# Patient Record
Sex: Female | Born: 1980 | Race: White | Hispanic: No | Marital: Single | Smoking: Never smoker
Health system: Southern US, Community
[De-identification: ages and names within clinical notes are randomized; demographics above are authoritative.]

## PROBLEM LIST (undated history)

## (undated) DIAGNOSIS — M5126 Other intervertebral disc displacement, lumbar region: Secondary | ICD-10-CM

## (undated) DIAGNOSIS — T7840XA Allergy, unspecified, initial encounter: Secondary | ICD-10-CM

## (undated) DIAGNOSIS — F79 Unspecified intellectual disabilities: Secondary | ICD-10-CM

## (undated) DIAGNOSIS — K219 Gastro-esophageal reflux disease without esophagitis: Secondary | ICD-10-CM

## (undated) DIAGNOSIS — M1990 Unspecified osteoarthritis, unspecified site: Secondary | ICD-10-CM

## (undated) DIAGNOSIS — R0602 Shortness of breath: Secondary | ICD-10-CM

## (undated) HISTORY — DX: Gastro-esophageal reflux disease without esophagitis: K21.9

## (undated) HISTORY — DX: Allergy, unspecified, initial encounter: T78.40XA

## (undated) HISTORY — DX: Unspecified osteoarthritis, unspecified site: M19.90

## (undated) HISTORY — DX: Other intervertebral disc displacement, lumbar region: M51.26

---

## 2013-01-19 ENCOUNTER — Telehealth: Payer: Self-pay | Admitting: Nurse Practitioner

## 2013-01-19 NOTE — Telephone Encounter (Signed)
APPTMADE FOR Thursday WITH MMM

## 2013-01-21 ENCOUNTER — Ambulatory Visit (INDEPENDENT_AMBULATORY_CARE_PROVIDER_SITE_OTHER): Payer: Medicare Other | Admitting: Nurse Practitioner

## 2013-01-21 ENCOUNTER — Encounter: Payer: Self-pay | Admitting: Nurse Practitioner

## 2013-01-21 VITALS — BP 109/72 | HR 97 | Temp 97.8°F | Ht 63.0 in | Wt 226.0 lb

## 2013-01-21 DIAGNOSIS — F79 Unspecified intellectual disabilities: Secondary | ICD-10-CM | POA: Diagnosis not present

## 2013-01-21 DIAGNOSIS — M545 Low back pain, unspecified: Secondary | ICD-10-CM

## 2013-01-21 DIAGNOSIS — R635 Abnormal weight gain: Secondary | ICD-10-CM | POA: Diagnosis not present

## 2013-01-21 DIAGNOSIS — E785 Hyperlipidemia, unspecified: Secondary | ICD-10-CM | POA: Diagnosis not present

## 2013-01-21 LAB — THYROID PANEL WITH TSH
Free Thyroxine Index: 3.3 (ref 1.0–3.9)
T3 Uptake: 36.3 % (ref 22.5–37.0)
T4, Total: 9.2 ug/dL (ref 5.0–12.5)
TSH: 1.073 u[IU]/mL (ref 0.350–4.500)

## 2013-01-21 LAB — COMPLETE METABOLIC PANEL WITH GFR
ALT: 28 U/L (ref 0–35)
AST: 15 U/L (ref 0–37)
Albumin: 4.5 g/dL (ref 3.5–5.2)
Alkaline Phosphatase: 50 U/L (ref 39–117)
BUN: 10 mg/dL (ref 6–23)
CO2: 22 mEq/L (ref 19–32)
Calcium: 9.8 mg/dL (ref 8.4–10.5)
Chloride: 104 mEq/L (ref 96–112)
Creat: 0.76 mg/dL (ref 0.50–1.10)
GFR, Est African American: >89 mL/min
GFR, Est Non African American: >89 mL/min
Glucose, Bld: 93 mg/dL (ref 70–99)
Potassium: 4 mEq/L (ref 3.5–5.3)
Sodium: 138 mEq/L (ref 135–145)
Total Bilirubin: 0.3 mg/dL (ref 0.3–1.2)
Total Protein: 7.3 g/dL (ref 6.0–8.3)

## 2013-01-21 MED ORDER — PREDNISONE (PAK) 10 MG PO TABS: 10.0000 mg | ORAL_TABLET | Freq: Every day | ORAL | Status: DC

## 2013-01-21 MED ORDER — CYCLOBENZAPRINE HCL 5 MG PO TABS: 5.0000 mg | ORAL_TABLET | Freq: Three times a day (TID) | ORAL | Status: DC | PRN

## 2013-01-21 NOTE — Progress Notes (Signed)
  Subjective:    Patient ID: Ashlee Hoffman, female    DOB: [birth year], 32 y.o.   MRN: [id number]  HPI- Patient brought amy stating that she has been c/o lower back pian. Started in the last couple of weeks she is constantly complaining. Have given her aleve which is not easing the pain. Seems to be worse when she is up moving around. Mom says that she has been gaining weight- hungry all the time and not sure if that is what is causing back pain. Mom has been giving her healthy foods to eat but still gaining weight.    Review of Systems  Musculoskeletal: Positive for back pain (hard to evaluate severity d/t patient handicap).       Objective:   Physical Exam  Constitutional: She appears well-developed and well-nourished.  Cardiovascular: Normal rate, normal heart sounds and intact distal pulses.   Pulmonary/Chest: Effort normal and breath sounds normal.  Musculoskeletal:  Point tenrderness mid lower back on palpation FROM of back with slight pain on rotation. (-) SLR bil Motor strength and sensation distally intact bil  Neurological: She has normal reflexes.    BP 109/72  Pulse 97  Temp(Src) 97.8 F (36.6 C) (Oral)  Ht 5\' 3"  (1.6 m)  Wt 226 lb (102.513 kg)  BMI 40.04 kg/m2       Assessment & Plan:  1. Mentally disabled   2. Low back pain Moist heat Rest No heavy lifting - cyclobenzaprine (FLEXERIL) 5 MG tablet; Take 1 tablet (5 mg total) by mouth 3 (three) times daily as needed for muscle spasms.  Dispense: 30 tablet; Refill: 1 - predniSONE (STERAPRED UNI-PAK) 10 MG tablet; Take 1 tablet (10 mg total) by mouth daily. As directed X6 days  Dispense: 21 tablet; Refill: 0  3. Weight gain Continue low fat diet and exercise - COMPLETE METABOLIC PANEL WITH GFR - NMR Lipoprofile with Lipids  Mary-Margaret Daphine Deutscher, FNP  - Thyroid Panel With TSH

## 2013-01-21 NOTE — Patient Instructions (Signed)
Back Pain, Adult Low back pain is very common. About 1 in 5 people have back pain.The cause of low back pain is rarely dangerous. The pain often gets better over time.About half of people with a sudden onset of back pain feel better in just 2 weeks. About 8 in 10 people feel better by 6 weeks.  CAUSES Some common causes of back pain include:  Strain of the muscles or ligaments supporting the spine.  Wear and tear (degeneration) of the spinal discs.  Arthritis.  Direct injury to the back. DIAGNOSIS Most of the time, the direct cause of low back pain is not known.However, back pain can be treated effectively even when the exact cause of the pain is unknown.Answering your caregiver's questions about your overall health and symptoms is one of the most accurate ways to make sure the cause of your pain is not dangerous. If your caregiver needs more information, he or she may order lab work or imaging tests (X-rays or MRIs).However, even if imaging tests show changes in your back, this usually does not require surgery. HOME CARE INSTRUCTIONS For many people, back pain returns.Since low back pain is rarely dangerous, it is often a condition that people can learn to manageon their own.   Remain active. It is stressful on the back to sit or stand in one place. Do not sit, drive, or stand in one place for more than 30 minutes at a time. Take short walks on level surfaces as soon as pain allows.Try to increase the length of time you walk each day.  Do not stay in bed.Resting more than 1 or 2 days can delay your recovery.  Do not avoid exercise or work.Your body is made to move.It is not dangerous to be active, even though your back may hurt.Your back will likely heal faster if you return to being active before your pain is gone.  Pay attention to your body when you bend and lift. Many people have less discomfortwhen lifting if they bend their knees, keep the load close to their bodies,and  avoid twisting. Often, the most comfortable positions are those that put less stress on your recovering back.  Find a comfortable position to sleep. Use a firm mattress and lie on your side with your knees slightly bent. If you lie on your back, put a pillow under your knees.  Only take over-the-counter or prescription medicines as directed by your caregiver. Over-the-counter medicines to reduce pain and inflammation are often the most helpful.Your caregiver may prescribe muscle relaxant drugs.These medicines help dull your pain so you can more quickly return to your normal activities and healthy exercise.  Put ice on the injured area.  Put ice in a plastic bag.  Place a towel between your skin and the bag.  Leave the ice on for 15 to 20 minutes, 3 to 4 times a day for the first 2 to 3 days. After that, ice and heat may be alternated to reduce pain and spasms.  Ask your caregiver about trying back exercises and gentle massage. This may be of some benefit.  Avoid feeling anxious or stressed.Stress increases muscle tension and can worsen back pain.It is important to recognize when you are anxious or stressed and learn ways to manage it.Exercise is a great option. SEEK MEDICAL CARE IF:  You have pain that is not relieved with rest or medicine.  You have pain that does not improve in 1 week.  You have new symptoms.  You are generally   not feeling well. SEEK IMMEDIATE MEDICAL CARE IF:   You have pain that radiates from your back into your legs.  You develop new bowel or bladder control problems.  You have unusual weakness or numbness in your arms or legs.  You develop nausea or vomiting.  You develop abdominal pain.  You feel faint. Document Released: 09/02/2005 Document Revised: 03/03/2012 Document Reviewed: 01/21/2011 Exit[employer] Patient Information 2013 [employer].  

## 2013-01-25 LAB — NMR LIPOPROFILE WITH LIPIDS
Cholesterol, Total: 155 mg/dL (ref <200)
HDL Particle Number: 25.7 umol/L — ABNORMAL LOW (ref >=30.5)
HDL Size: 8.7 nm — ABNORMAL LOW (ref >=9.2)
HDL-C: 35 mg/dL — ABNORMAL LOW (ref >=40)
LDL (calc): 97 mg/dL (ref <100)
LDL Particle Number: 1226 nmol/L — ABNORMAL HIGH (ref <1000)
LDL Size: 21 nm (ref >20.5)
LP-IR Score: 65 — ABNORMAL HIGH (ref <=45)
Large HDL-P: 4.3 umol/L — ABNORMAL LOW (ref >=4.8)
Large VLDL-P: 4 nmol/L — ABNORMAL HIGH (ref <=2.7)
Small LDL Particle Number: 648 nmol/L — ABNORMAL HIGH (ref <=527)
Triglycerides: 117 mg/dL (ref <150)
VLDL Size: 46.4 nm (ref <=46.6)

## 2013-02-22 ENCOUNTER — Ambulatory Visit (INDEPENDENT_AMBULATORY_CARE_PROVIDER_SITE_OTHER): Payer: Medicare Other | Admitting: Nurse Practitioner

## 2013-02-22 ENCOUNTER — Encounter: Payer: Self-pay | Admitting: Nurse Practitioner

## 2013-02-22 VITALS — BP 109/74 | Temp 98.4°F | Ht 63.0 in | Wt 222.0 lb

## 2013-02-22 DIAGNOSIS — M545 Low back pain, unspecified: Secondary | ICD-10-CM

## 2013-02-22 MED ORDER — MELOXICAM 15 MG PO TABS: 15.0000 mg | ORAL_TABLET | Freq: Every day | ORAL | Status: DC

## 2013-02-22 MED ORDER — CELECOXIB 200 MG PO CAPS: 200.0000 mg | ORAL_CAPSULE | Freq: Two times a day (BID) | ORAL | Status: DC

## 2013-02-22 NOTE — Progress Notes (Signed)
  Subjective:    Patient ID: Ashlee Hoffman, female    DOB: 1981/02/26, 32 y.o.   MRN: [id number]  HPI Patient in for follow-up of low back pain- WAs given flexeril and steroid pack-She is still complaining everyday of back hurting- Pain stays in the same spot and doesn't radiate .    Review of Systems  Musculoskeletal: Positive for back pain. Negative for gait problem.       Objective:   Physical Exam  Constitutional: She appears well-developed and well-nourished.  Cardiovascular: Normal rate, normal heart sounds and intact distal pulses.   Pulmonary/Chest: Effort normal and breath sounds normal.  Musculoskeletal:  Decreae ROM of lumbar spine due to pain on flexion an dextension. (-) SLR bil Motor strength and sensation distally intact  Neurological: She has normal reflexes.     BP 109/74  Temp(Src) 98.4 F (36.9 C) (Oral)  Ht 5\' 3"  (1.6 m)  Wt 222 lb (100.699 kg)  BMI 39.34 kg/m2      Assessment & Plan:  1. Low back pain Moist heat and rest - MR Lumbar Spine Wo Contrast; Future - meloxicam (MOBIC) 15 MG tablet; Take 1 tablet (15 mg total) by mouth daily.  Dispense: 30 tablet; Refill: 0   Mary-Margaret Daphine Deutscher, FNP

## 2013-02-22 NOTE — Patient Instructions (Signed)
Back Pain, Adult  Low back pain is very common. About 1 in 5 people have back pain. The cause of low back pain is rarely dangerous. The pain often gets better over time. About half of people with a sudden onset of back pain feel better in just 2 weeks. About 8 in 10 people feel better by 6 weeks.   CAUSES  Some common causes of back pain include:  · Strain of the muscles or ligaments supporting the spine.  · Wear and tear (degeneration) of the spinal discs.  · Arthritis.  · Direct injury to the back.  DIAGNOSIS  Most of the time, the direct cause of low back pain is not known. However, back pain can be treated effectively even when the exact cause of the pain is unknown. Answering your caregiver's questions about your overall health and symptoms is one of the most accurate ways to make sure the cause of your pain is not dangerous. If your caregiver needs more information, he or she may order lab work or imaging tests (X-rays or MRIs). However, even if imaging tests show changes in your back, this usually does not require surgery.  HOME CARE INSTRUCTIONS  For many people, back pain returns. Since low back pain is rarely dangerous, it is often a condition that people can learn to manage on their own.   · Remain active. It is stressful on the back to sit or stand in one place. Do not sit, drive, or stand in one place for more than 30 minutes at a time. Take short walks on level surfaces as soon as pain allows. Try to increase the length of time you walk each day.  · Do not stay in bed. Resting more than 1 or 2 days can delay your recovery.  · Do not avoid exercise or work. Your body is made to move. It is not dangerous to be active, even though your back may hurt. Your back will likely heal faster if you return to being active before your pain is gone.  · Pay attention to your body when you  bend and lift. Many people have less discomfort when lifting if they bend their knees, keep the load close to their bodies, and  avoid twisting. Often, the most comfortable positions are those that put less stress on your recovering back.  · Find a comfortable position to sleep. Use a firm mattress and lie on your side with your knees slightly bent. If you lie on your back, put a pillow under your knees.  · Only take over-the-counter or prescription medicines as directed by your caregiver. Over-the-counter medicines to reduce pain and inflammation are often the most helpful. Your caregiver may prescribe muscle relaxant drugs. These medicines help dull your pain so you can more quickly return to your normal activities and healthy exercise.  · Put ice on the injured area.  · Put ice in a plastic bag.  · Place a towel between your skin and the bag.  · Leave the ice on for 15-20 minutes, 3-4 times a day for the first 2 to 3 days. After that, ice and heat may be alternated to reduce pain and spasms.  · Ask your caregiver about trying back exercises and gentle massage. This may be of some benefit.  · Avoid feeling anxious or stressed. Stress increases muscle tension and can worsen back pain. It is important to recognize when you are anxious or stressed and learn ways to manage it. Exercise is a great option.  SEEK MEDICAL CARE IF:  · You have pain that is not relieved with rest or   medicine.  · You have pain that does not improve in 1 week.  · You have new symptoms.  · You are generally not feeling well.  SEEK IMMEDIATE MEDICAL CARE IF:   · You have pain that radiates from your back into your legs.  · You develop new bowel or bladder control problems.  · You have unusual weakness or numbness in your arms or legs.  · You develop nausea or vomiting.  · You develop abdominal pain.  · You feel faint.  Document Released: 09/02/2005 Document Revised: 03/03/2012 Document Reviewed: 01/21/2011  ExitCare® Patient Information ©2014 [employer].

## 2013-02-26 ENCOUNTER — Other Ambulatory Visit (HOSPITAL_COMMUNITY): Payer: Self-pay

## 2013-03-01 ENCOUNTER — Ambulatory Visit (HOSPITAL_COMMUNITY)
Admission: RE | Admit: 2013-03-01 | Discharge: 2013-03-01 | Disposition: A | Discharge status: Home / Self Care | Payer: Medicare Other | Source: Ambulatory Visit | Attending: Nurse Practitioner | Admitting: Nurse Practitioner

## 2013-03-01 DIAGNOSIS — M545 Low back pain, unspecified: Secondary | ICD-10-CM | POA: Diagnosis not present

## 2013-03-01 DIAGNOSIS — M79609 Pain in unspecified limb: Secondary | ICD-10-CM | POA: Diagnosis not present

## 2013-03-01 DIAGNOSIS — M5380 Other specified dorsopathies, site unspecified: Secondary | ICD-10-CM | POA: Insufficient documentation

## 2013-03-01 DIAGNOSIS — M5124 Other intervertebral disc displacement, thoracic region: Secondary | ICD-10-CM | POA: Insufficient documentation

## 2013-03-01 DIAGNOSIS — M5126 Other intervertebral disc displacement, lumbar region: Secondary | ICD-10-CM | POA: Diagnosis not present

## 2013-03-01 DIAGNOSIS — M47817 Spondylosis without myelopathy or radiculopathy, lumbosacral region: Secondary | ICD-10-CM | POA: Diagnosis not present

## 2013-03-02 ENCOUNTER — Other Ambulatory Visit: Payer: Self-pay | Admitting: Nurse Practitioner

## 2013-03-02 DIAGNOSIS — M5126 Other intervertebral disc displacement, lumbar region: Secondary | ICD-10-CM

## 2013-03-16 DIAGNOSIS — M5126 Other intervertebral disc displacement, lumbar region: Secondary | ICD-10-CM | POA: Diagnosis not present

## 2013-05-04 DIAGNOSIS — M5126 Other intervertebral disc displacement, lumbar region: Secondary | ICD-10-CM | POA: Diagnosis not present

## 2013-05-05 ENCOUNTER — Other Ambulatory Visit: Payer: Self-pay | Admitting: Neurosurgery

## 2013-05-14 ENCOUNTER — Encounter (HOSPITAL_COMMUNITY): Payer: Self-pay | Admitting: Pharmacy Technician

## 2013-05-14 NOTE — Pre-Procedure Instructions (Signed)
Aalliyah Kilker  05/14/2013   Your procedure is scheduled on:  Monday, September 8th.  Report to [facility] at  AM.  Call this number if you have problems the morning of surgery: [phone]   Remember:   Do not eat food or drink liquids after midnight.   Take these medicines the morning of surgery with A SIP OF WATER:    Do not wear jewelry, make-up or nail polish.  Do not wear lotions, powders, or perfumes. You may wear deodorant.  Do not shave 48 hours prior to surgery.   Do not bring valuables to the hospital.  [facility] is not responsible for any belongings or valuables.  Contacts, dentures or bridgework may not be worn into surgery.  Leave suitcase in the car. After surgery it may be brought to your room.  For patients admitted to the hospital, checkout time is 11:00 AM the day of discharge.   Patients discharged the day of surgery will not be allowed to drive home.  Name and phone number of your driver:   Special Instructions: Shower using CHG 2 nights before surgery and the night before surgery.  If you shower the day of surgery use CHG.  Use special wash - you have one bottle of CHG for all showers.  You should use approximately 1/3 of the bottle for each shower.   Please read over the following fact sheets that you were given: Pain Booklet, Coughing and Deep Breathing and Surgical Site Infection Prevention

## 2013-05-17 DIAGNOSIS — M5136 Other intervertebral disc degeneration, lumbar region: Secondary | ICD-10-CM

## 2013-05-17 DIAGNOSIS — M5126 Other intervertebral disc displacement, lumbar region: Secondary | ICD-10-CM

## 2013-05-17 DIAGNOSIS — M51369 Other intervertebral disc degeneration, lumbar region without mention of lumbar back pain or lower extremity pain: Secondary | ICD-10-CM

## 2013-05-17 HISTORY — DX: Other intervertebral disc degeneration, lumbar region: M51.36

## 2013-05-17 HISTORY — DX: Other intervertebral disc displacement, lumbar region: M51.26

## 2013-05-17 HISTORY — DX: Other intervertebral disc degeneration, lumbar region without mention of lumbar back pain or lower extremity pain: M51.369

## 2013-05-18 ENCOUNTER — Encounter (HOSPITAL_COMMUNITY)
Admission: RE | Admit: 2013-05-18 | Discharge: 2013-05-18 | Disposition: A | Discharge status: Home / Self Care | Payer: Medicare Other | Source: Ambulatory Visit | Attending: Neurosurgery | Admitting: Neurosurgery

## 2013-05-18 ENCOUNTER — Encounter (HOSPITAL_COMMUNITY): Payer: Self-pay

## 2013-05-18 DIAGNOSIS — Z01812 Encounter for preprocedural laboratory examination: Secondary | ICD-10-CM | POA: Insufficient documentation

## 2013-05-18 DIAGNOSIS — Z01818 Encounter for other preprocedural examination: Secondary | ICD-10-CM | POA: Insufficient documentation

## 2013-05-18 HISTORY — DX: Shortness of breath: R06.02

## 2013-05-18 LAB — CBC WITH DIFFERENTIAL/PLATELET
Basophils Absolute: 0 10*3/uL (ref 0.0–0.1)
Basophils Relative: 0 % (ref 0–1)
Eosinophils Absolute: 0.1 10*3/uL (ref 0.0–0.7)
Eosinophils Relative: 1 % (ref 0–5)
HCT: 36 % (ref 36.0–46.0)
Hemoglobin: 12.5 g/dL (ref 12.0–15.0)
Lymphocytes Relative: 23 % (ref 12–46)
Lymphs Abs: 2.2 10*3/uL (ref 0.7–4.0)
MCH: 30 pg (ref 26.0–34.0)
MCHC: 34.7 g/dL (ref 30.0–36.0)
MCV: 86.5 fL (ref 78.0–100.0)
Monocytes Absolute: 0.6 10*3/uL (ref 0.1–1.0)
Monocytes Relative: 7 % (ref 3–12)
Neutro Abs: 6.4 10*3/uL (ref 1.7–7.7)
Neutrophils Relative %: 69 % (ref 43–77)
Platelets: 267 10*3/uL (ref 150–400)
RBC: 4.16 MIL/uL (ref 3.87–5.11)
RDW: 12.3 % (ref 11.5–15.5)
WBC: 9.3 10*3/uL (ref 4.0–10.5)

## 2013-05-18 LAB — BASIC METABOLIC PANEL
BUN: 15 mg/dL (ref 6–23)
CO2: 21 mEq/L (ref 19–32)
Calcium: 9.2 mg/dL (ref 8.4–10.5)
Chloride: 104 mEq/L (ref 96–112)
Creatinine, Ser: 0.65 mg/dL (ref 0.50–1.10)
GFR calc Af Amer: >90 mL/min (ref >90)
GFR calc non Af Amer: >90 mL/min (ref >90)
Glucose, Bld: 84 mg/dL (ref 70–99)
Potassium: 4.2 mEq/L (ref 3.5–5.1)
Sodium: 136 mEq/L (ref 135–145)

## 2013-05-18 LAB — SURGICAL PCR SCREEN
MRSA, PCR: NEGATIVE
Staphylococcus aureus: NEGATIVE

## 2013-05-18 LAB — HCG, SERUM, QUALITATIVE: Preg, Serum: NEGATIVE

## 2013-05-18 NOTE — Progress Notes (Signed)
Instructed to stop Mobic after today.

## 2013-05-18 NOTE — Pre-Procedure Instructions (Signed)
Ezmae Speers  05/18/2013   Your procedure is scheduled on:  Monday, September 8th.  Report to [facility] at  AM.  Call this number if you have problems the morning of surgery: [phone]   Remember:   Do not eat food or drink liquids after midnight.   Take these medicines the morning of surgery with A SIP OF WATER: medroxyPROGESTERone (PROVERA).     Do not wear jewelry, make-up or nail polish.  Do not wear lotions, powders, or perfumes. You may wear deodorant.  Do not shave 48 hours prior to surgery.   Do not bring valuables to the hospital.  [facility] is not responsible for any belongings or valuables.  Contacts, dentures or bridgework may not be worn into surgery.  Leave suitcase in the car. After surgery it may be brought to your room.  For patients admitted to the hospital, checkout time is 11:00 AM the day of discharge.   Patients discharged the day of surgery will not be allowed to drive home.  Name and phone number of your driver:   Special Instructions: Shower using CHG 2 nights before surgery and the night before surgery.  If you shower the day of surgery use CHG.  Use special wash - you have one bottle of CHG for all showers.  You should use approximately 1/3 of the bottle for each shower.   Please read over the following fact sheets that you were given: Pain Booklet, Coughing and Deep Breathing and Surgical Site Infection Prevention

## 2013-05-19 NOTE — Progress Notes (Signed)
No fax received from Primus Bravo (FNP) office regarding last visit/EKG. Office called, this nurse was told that office would send info.

## 2013-05-23 MED ORDER — CEFAZOLIN SODIUM-DEXTROSE 2-3 GM-% IV SOLR
2.0000 g | INTRAVENOUS | Status: AC
  Administered 2013-05-24: 2 g via INTRAVENOUS
  Filled 2013-05-23: qty 50

## 2013-05-24 ENCOUNTER — Ambulatory Visit (HOSPITAL_COMMUNITY): Payer: Medicare Other

## 2013-05-24 ENCOUNTER — Encounter (HOSPITAL_COMMUNITY): Payer: Self-pay | Admitting: Anesthesiology

## 2013-05-24 ENCOUNTER — Encounter (HOSPITAL_COMMUNITY)
Admission: RE | Disposition: A | Discharge status: Home / Self Care | Payer: Self-pay | Source: Ambulatory Visit | Attending: Neurosurgery

## 2013-05-24 ENCOUNTER — Ambulatory Visit (HOSPITAL_COMMUNITY)
Admission: RE | Admit: 2013-05-24 | Discharge: 2013-05-24 | Disposition: A | Discharge status: Home / Self Care | Payer: Medicare Other | Source: Ambulatory Visit | Attending: Neurosurgery | Admitting: Neurosurgery

## 2013-05-24 ENCOUNTER — Ambulatory Visit (HOSPITAL_COMMUNITY): Payer: Medicare Other | Admitting: Anesthesiology

## 2013-05-24 DIAGNOSIS — F79 Unspecified intellectual disabilities: Secondary | ICD-10-CM | POA: Insufficient documentation

## 2013-05-24 DIAGNOSIS — IMO0002 Reserved for concepts with insufficient information to code with codable children: Secondary | ICD-10-CM | POA: Diagnosis not present

## 2013-05-24 DIAGNOSIS — R079 Chest pain, unspecified: Secondary | ICD-10-CM | POA: Diagnosis not present

## 2013-05-24 DIAGNOSIS — M519 Unspecified thoracic, thoracolumbar and lumbosacral intervertebral disc disorder: Secondary | ICD-10-CM | POA: Diagnosis not present

## 2013-05-24 DIAGNOSIS — Z79899 Other long term (current) drug therapy: Secondary | ICD-10-CM | POA: Insufficient documentation

## 2013-05-24 DIAGNOSIS — M5126 Other intervertebral disc displacement, lumbar region: Secondary | ICD-10-CM | POA: Diagnosis not present

## 2013-05-24 DIAGNOSIS — Z452 Encounter for adjustment and management of vascular access device: Secondary | ICD-10-CM | POA: Diagnosis not present

## 2013-05-24 HISTORY — DX: Unspecified intellectual disabilities: F79

## 2013-05-24 HISTORY — PX: LUMBAR LAMINECTOMY/DECOMPRESSION MICRODISCECTOMY: SHX5026

## 2013-05-24 SURGERY — LUMBAR LAMINECTOMY/DECOMPRESSION MICRODISCECTOMY 1 LEVEL
Anesthesia: General | Site: Spine Lumbar | Wound class: Clean

## 2013-05-24 MED ORDER — CEFAZOLIN SODIUM 1-5 GM-% IV SOLN
1.0000 g | Freq: Three times a day (TID) | INTRAVENOUS | Status: DC
  Filled 2013-05-24 (×2): qty 50

## 2013-05-24 MED ORDER — BUPIVACAINE HCL (PF) 0.25 % IJ SOLN
INTRAMUSCULAR | Status: DC | PRN
  Administered 2013-05-24: 20 mL

## 2013-05-24 MED ORDER — CYCLOBENZAPRINE HCL 10 MG PO TABS
10.0000 mg | ORAL_TABLET | Freq: Three times a day (TID) | ORAL | Status: DC | PRN
  Administered 2013-05-24: 10 mg via ORAL
  Filled 2013-05-24: qty 1

## 2013-05-24 MED ORDER — PHENYLEPHRINE HCL 10 MG/ML IJ SOLN
INTRAMUSCULAR | Status: DC | PRN
  Administered 2013-05-24: 40 ug via INTRAVENOUS

## 2013-05-24 MED ORDER — HYDROCODONE-ACETAMINOPHEN 5-325 MG PO TABS: 1.0000 | ORAL_TABLET | ORAL | Status: DC | PRN

## 2013-05-24 MED ORDER — 0.9 % SODIUM CHLORIDE (POUR BTL) OPTIME
TOPICAL | Status: DC | PRN
  Administered 2013-05-24: 1000 mL

## 2013-05-24 MED ORDER — OXYCODONE-ACETAMINOPHEN 5-325 MG PO TABS
1.0000 | ORAL_TABLET | ORAL | Status: DC | PRN
  Administered 2013-05-24: 2 via ORAL
  Filled 2013-05-24: qty 2

## 2013-05-24 MED ORDER — ACETAMINOPHEN 325 MG PO TABS: 650.0000 mg | ORAL_TABLET | ORAL | Status: DC | PRN

## 2013-05-24 MED ORDER — ACETAMINOPHEN 650 MG RE SUPP: 650.0000 mg | RECTAL | Status: DC | PRN

## 2013-05-24 MED ORDER — HYDROMORPHONE HCL PF 1 MG/ML IJ SOLN
0.5000 mg | INTRAMUSCULAR | Status: DC | PRN
Start: 2013-05-24 — End: 2013-05-25

## 2013-05-24 MED ORDER — PROPOFOL 10 MG/ML IV BOLUS
INTRAVENOUS | Status: DC | PRN
  Administered 2013-05-24: 170 mg via INTRAVENOUS

## 2013-05-24 MED ORDER — GLYCOPYRROLATE 0.2 MG/ML IJ SOLN
INTRAMUSCULAR | Status: DC | PRN
  Administered 2013-05-24: .6 mg via INTRAVENOUS

## 2013-05-24 MED ORDER — SODIUM CHLORIDE 0.9 % IJ SOLN: 3.0000 mL | Freq: Two times a day (BID) | INTRAMUSCULAR | Status: DC

## 2013-05-24 MED ORDER — ONDANSETRON HCL 4 MG/2ML IJ SOLN: 4.0000 mg | INTRAMUSCULAR | Status: DC | PRN

## 2013-05-24 MED ORDER — NEOSTIGMINE METHYLSULFATE 1 MG/ML IJ SOLN
INTRAMUSCULAR | Status: DC | PRN
  Administered 2013-05-24: 4 mg via INTRAVENOUS

## 2013-05-24 MED ORDER — ONDANSETRON HCL 4 MG/2ML IJ SOLN
INTRAMUSCULAR | Status: DC | PRN
  Administered 2013-05-24: 4 mg via INTRAVENOUS

## 2013-05-24 MED ORDER — THROMBIN 5000 UNITS EX SOLR
CUTANEOUS | Status: DC | PRN
  Administered 2013-05-24 (×2): 5000 [IU] via TOPICAL

## 2013-05-24 MED ORDER — LIDOCAINE HCL (CARDIAC) 20 MG/ML IV SOLN
INTRAVENOUS | Status: DC | PRN
  Administered 2013-05-24: 40 mg via INTRAVENOUS

## 2013-05-24 MED ORDER — DEXAMETHASONE SODIUM PHOSPHATE 10 MG/ML IJ SOLN
10.0000 mg | INTRAMUSCULAR | Status: AC
  Administered 2013-05-24: 10 mg via INTRAVENOUS
  Filled 2013-05-24: qty 1

## 2013-05-24 MED ORDER — MIDAZOLAM HCL 5 MG/5ML IJ SOLN
INTRAMUSCULAR | Status: DC | PRN
  Administered 2013-05-24 (×2): 1 mg via INTRAVENOUS

## 2013-05-24 MED ORDER — KETOROLAC TROMETHAMINE 30 MG/ML IJ SOLN: 30.0000 mg | Freq: Four times a day (QID) | INTRAMUSCULAR | Status: DC

## 2013-05-24 MED ORDER — SODIUM CHLORIDE 0.9 % IJ SOLN: 3.0000 mL | INTRAMUSCULAR | Status: DC | PRN

## 2013-05-24 MED ORDER — FENTANYL CITRATE 0.05 MG/ML IJ SOLN
INTRAMUSCULAR | Status: DC | PRN
  Administered 2013-05-24 (×7): 50 ug via INTRAVENOUS

## 2013-05-24 MED ORDER — SODIUM CHLORIDE 0.9 % IR SOLN
Status: DC | PRN
  Administered 2013-05-24: 12:00:00

## 2013-05-24 MED ORDER — ROCURONIUM BROMIDE 100 MG/10ML IV SOLN
INTRAVENOUS | Status: DC | PRN
  Administered 2013-05-24: 45 mg via INTRAVENOUS
  Administered 2013-05-24: 5 mg via INTRAVENOUS

## 2013-05-24 MED ORDER — KETOROLAC TROMETHAMINE 30 MG/ML IJ SOLN
INTRAMUSCULAR | Status: DC | PRN
  Administered 2013-05-24: 30 mg via INTRAVENOUS

## 2013-05-24 MED ORDER — SENNA 8.6 MG PO TABS: 1.0000 | ORAL_TABLET | Freq: Two times a day (BID) | ORAL | Status: DC

## 2013-05-24 MED ORDER — MENTHOL 3 MG MT LOZG: 1.0000 | LOZENGE | OROMUCOSAL | Status: DC | PRN

## 2013-05-24 MED ORDER — ARTIFICIAL TEARS OP OINT
TOPICAL_OINTMENT | OPHTHALMIC | Status: DC | PRN
  Administered 2013-05-24: 1 via OPHTHALMIC

## 2013-05-24 MED ORDER — PHENOL 1.4 % MT LIQD: 1.0000 | OROMUCOSAL | Status: DC | PRN

## 2013-05-24 MED ORDER — ALUM & MAG HYDROXIDE-SIMETH 200-200-20 MG/5ML PO SUSP: 30.0000 mL | Freq: Four times a day (QID) | ORAL | Status: DC | PRN

## 2013-05-24 MED ORDER — LACTATED RINGERS IV SOLN: INTRAVENOUS | Status: DC

## 2013-05-24 MED ORDER — HEMOSTATIC AGENTS (NO CHARGE) OPTIME
TOPICAL | Status: DC | PRN
  Administered 2013-05-24: 1 via TOPICAL

## 2013-05-24 MED ORDER — SODIUM CHLORIDE 0.9 % IV SOLN: 250.0000 mL | INTRAVENOUS | Status: DC

## 2013-05-24 MED ORDER — MEDROXYPROGESTERONE ACETATE 10 MG PO TABS: 10.0000 mg | ORAL_TABLET | Freq: Every day | ORAL | Status: DC

## 2013-05-24 MED ORDER — CYCLOBENZAPRINE HCL 10 MG PO TABS: 10.0000 mg | ORAL_TABLET | Freq: Three times a day (TID) | ORAL | Status: DC | PRN

## 2013-05-24 MED ORDER — LACTATED RINGERS IV SOLN
INTRAVENOUS | Status: DC | PRN
  Administered 2013-05-24: 11:00:00 via INTRAVENOUS

## 2013-05-24 SURGICAL SUPPLY — 57 items
BAG DECANTER FOR FLEXI CONT (MISCELLANEOUS) ×2 IMPLANT
BENZOIN TINCTURE PRP APPL 2/3 (GAUZE/BANDAGES/DRESSINGS) ×2 IMPLANT
BLADE SURG ROTATE 9660 (MISCELLANEOUS) IMPLANT
BRUSH SCRUB EZ PLAIN DRY (MISCELLANEOUS) ×2 IMPLANT
BUR CUTTER 7.0 ROUND (BURR) ×2 IMPLANT
CANISTER SUCTION 2500CC (MISCELLANEOUS) ×2 IMPLANT
CLOTH BEACON ORANGE TIMEOUT ST (SAFETY) ×2 IMPLANT
CONT SPEC 4OZ CLIKSEAL STRL BL (MISCELLANEOUS) ×2 IMPLANT
DECANTER SPIKE VIAL GLASS SM (MISCELLANEOUS) IMPLANT
DERMABOND ADHESIVE PROPEN (GAUZE/BANDAGES/DRESSINGS) ×1
DERMABOND ADVANCED (GAUZE/BANDAGES/DRESSINGS)
DERMABOND ADVANCED .7 DNX12 (GAUZE/BANDAGES/DRESSINGS) IMPLANT
DERMABOND ADVANCED .7 DNX6 (GAUZE/BANDAGES/DRESSINGS) ×1 IMPLANT
DRAPE LAPAROTOMY 100X72X124 (DRAPES) ×2 IMPLANT
DRAPE MICROSCOPE ZEISS OPMI (DRAPES) ×2 IMPLANT
DRAPE POUCH INSTRU U-SHP 10X18 (DRAPES) ×2 IMPLANT
DRAPE PROXIMA HALF (DRAPES) IMPLANT
DRAPE SURG 17X23 STRL (DRAPES) ×4 IMPLANT
DRSG OPSITE POSTOP 3X4 (GAUZE/BANDAGES/DRESSINGS) ×2 IMPLANT
DURAPREP 26ML APPLICATOR (WOUND CARE) ×2 IMPLANT
ELECT REM PT RETURN 9FT ADLT (ELECTROSURGICAL) ×2
ELECTRODE REM PT RTRN 9FT ADLT (ELECTROSURGICAL) ×1 IMPLANT
GAUZE SPONGE 4X4 16PLY XRAY LF (GAUZE/BANDAGES/DRESSINGS) IMPLANT
GLOVE BIOGEL PI IND STRL 7.0 (GLOVE) ×4 IMPLANT
GLOVE BIOGEL PI IND STRL 7.5 (GLOVE) ×1 IMPLANT
GLOVE BIOGEL PI IND STRL 8 (GLOVE) ×1 IMPLANT
GLOVE BIOGEL PI INDICATOR 7.0 (GLOVE) ×4
GLOVE BIOGEL PI INDICATOR 7.5 (GLOVE) ×1
GLOVE BIOGEL PI INDICATOR 8 (GLOVE) ×1
GLOVE ECLIPSE 7.5 STRL STRAW (GLOVE) ×4 IMPLANT
GLOVE ECLIPSE 8.5 STRL (GLOVE) ×2 IMPLANT
GLOVE EXAM NITRILE LRG STRL (GLOVE) IMPLANT
GLOVE EXAM NITRILE MD LF STRL (GLOVE) IMPLANT
GLOVE EXAM NITRILE XL STR (GLOVE) IMPLANT
GLOVE EXAM NITRILE XS STR PU (GLOVE) IMPLANT
GLOVE SS BIOGEL STRL SZ 6.5 (GLOVE) ×3 IMPLANT
GLOVE SUPERSENSE BIOGEL SZ 6.5 (GLOVE) ×3
GOWN BRE IMP SLV AUR LG STRL (GOWN DISPOSABLE) ×2 IMPLANT
GOWN BRE IMP SLV AUR XL STRL (GOWN DISPOSABLE) ×6 IMPLANT
GOWN STRL REIN 2XL LVL4 (GOWN DISPOSABLE) IMPLANT
KIT BASIN OR (CUSTOM PROCEDURE TRAY) ×2 IMPLANT
KIT ROOM TURNOVER OR (KITS) ×2 IMPLANT
NEEDLE HYPO 22GX1.5 SAFETY (NEEDLE) ×2 IMPLANT
NEEDLE SPNL 22GX3.5 QUINCKE BK (NEEDLE) ×2 IMPLANT
NS IRRIG 1000ML POUR BTL (IV SOLUTION) ×2 IMPLANT
PACK LAMINECTOMY NEURO (CUSTOM PROCEDURE TRAY) ×2 IMPLANT
PAD ARMBOARD 7.5X6 YLW CONV (MISCELLANEOUS) ×10 IMPLANT
RUBBERBAND STERILE (MISCELLANEOUS) ×4 IMPLANT
SPONGE GAUZE 4X4 12PLY (GAUZE/BANDAGES/DRESSINGS) IMPLANT
SPONGE SURGIFOAM ABS GEL SZ50 (HEMOSTASIS) ×2 IMPLANT
STRIP CLOSURE SKIN 1/2X4 (GAUZE/BANDAGES/DRESSINGS) ×2 IMPLANT
SUT VIC AB 2-0 CT1 18 (SUTURE) ×2 IMPLANT
SUT VIC AB 3-0 SH 8-18 (SUTURE) ×2 IMPLANT
SYR 20ML ECCENTRIC (SYRINGE) ×2 IMPLANT
TOWEL OR 17X24 6PK STRL BLUE (TOWEL DISPOSABLE) ×2 IMPLANT
TOWEL OR 17X26 10 PK STRL BLUE (TOWEL DISPOSABLE) ×2 IMPLANT
WATER STERILE IRR 1000ML POUR (IV SOLUTION) ×2 IMPLANT

## 2013-05-24 NOTE — Preoperative (Signed)
Beta Blockers   Reason not to administer Beta Blockers:Not Applicable 

## 2013-05-24 NOTE — Anesthesia Preprocedure Evaluation (Addendum)
Anesthesia Evaluation  Patient identified by MRN, date of birth, ID band Patient awake    Reviewed: Allergy & Precautions, H&P , NPO status   Airway Mallampati: II TM Distance: >3 FB Neck ROM: Full    Dental  (+) Teeth Intact and Dental Advisory Given   Pulmonary          Cardiovascular     Neuro/Psych Mental retardation    GI/Hepatic   Endo/Other    Renal/GU      Musculoskeletal   Abdominal   Peds  Hematology   Anesthesia Other Findings   Reproductive/Obstetrics                           Anesthesia Physical Anesthesia Plan  ASA: II  Anesthesia Plan: General   Post-op Pain Management:    Induction: Intravenous  Airway Management Planned: Oral ETT  Additional Equipment:   Intra-op Plan:   Post-operative Plan: Extubation in OR  Informed Consent: I have reviewed the patients History and Physical, chart, labs and discussed the procedure including the risks, benefits and alternatives for the proposed anesthesia with the patient or authorized representative who has indicated his/her understanding and acceptance.   Dental advisory given  Plan Discussed with: CRNA, Anesthesiologist and Surgeon  Anesthesia Plan Comments: (Late entry. K Hypes, CRNA)      Anesthesia Quick Evaluation

## 2013-05-24 NOTE — Progress Notes (Signed)
Pt d/c'd home. Discharge instructions/teaching done by previous nurse. Dressing on surgical site (neck) and central line d'c'd sites CDI. Pt medicated for pain just before leaving. Transported out in wheelchair to the car by the nurse tech, accompanied by her parents.

## 2013-05-24 NOTE — Brief Op Note (Signed)
05/24/2013  12:43 PM  PATIENT:  Ashlee Hoffman  32 y.o. female  PRE-OPERATIVE DIAGNOSIS:  HNP  POST-OPERATIVE DIAGNOSIS:  Herniated nucleous pulposus  PROCEDURE:  Procedure(s) with comments: LUMBAR LAMINECTOMY/DECOMPRESSION MICRODISCECTOMY 1 LEVEL LEFT LUMBAR FIVE-SACARAL-ONE (N/A) - left  SURGEON:  Surgeon(s) and Role:    * Temple Pacini, MD - Primary    * Hewitt Shorts, MD - Assisting  PHYSICIAN ASSISTANT:   ASSISTANTS:    ANESTHESIA:   general  EBL:  Total I/O In: 600 [I.V.:600] Out: -   BLOOD ADMINISTERED:none  DRAINS: none   LOCAL MEDICATIONS USED:  MARCAINE     SPECIMEN:  No Specimen  DISPOSITION OF SPECIMEN:  N/A  COUNTS:  YES  TOURNIQUET:  * No tourniquets in log *  DICTATION: .Dragon Dictation  PLAN OF CARE: Admit for overnight observation  PATIENT DISPOSITION:  PACU - hemodynamically stable.   Delay start of Pharmacological VTE agent (>24hrs) due to surgical blood loss or risk of bleeding: yes

## 2013-05-24 NOTE — Transfer of Care (Signed)
Immediate Anesthesia Transfer of Care Note  Patient: Ashlee Hoffman  Procedure(s) Performed: Procedure(s) with comments: LUMBAR LAMINECTOMY/DECOMPRESSION MICRODISCECTOMY 1 LEVEL LEFT LUMBAR FIVE-SACARAL-ONE (N/A) - left  Patient Location: PACU  Anesthesia Type:General  Level of Consciousness: awake and alert   Airway & Oxygen Therapy: Patient Spontanous Breathing and Patient connected to face mask oxygen  Post-op Assessment: Report given to PACU RN and Post -op Vital signs reviewed and stable  Post vital signs: Reviewed and stable  Complications: No apparent anesthesia complications

## 2013-05-24 NOTE — Op Note (Signed)
Date of procedure: 05/24/2013  Date of dictation: Same  Service: Neurosurgery  Preoperative diagnosis: Left L5-S1 herniated nucleus pulposus with radiculopathy  Postoperative diagnosis: Same  Procedure Name: Left L5-S1 laminotomy and microdiscectomy  Surgeon:Eames Dibiasio A.Shakiya Mcneary, M.D.  Asst. Surgeon: Newell Coral  Anesthesia: General  Indication: 32 year old female with back and left lower extremity pain failing conservative management. Workup demonstrates evidence of a left-sided L5-S1 disc herniation with marked compression of the thecal sac and left S1 nerve root. Patient presents now for microdiscectomy in hopes of improving her symptoms.  Operative note: After induction of anesthesia, patient positioned prone on the Wilson frame and appropriately padded. Lumbar region prepped and draped. Incision made overlying L5-S1. Subperiosteal dissection performed in the left side. Retractor placed. X-ray taken. A level confirmed. Laminotomy performed using a high-speed drill and Kerrison rongeurs. Ligamentum flavum elevated and resected. Microscope brought into the field using microdissection. The thecal sac and S1 nerve root gently mobilized toward the midline. Disc herniation dissected free and removed using blunt nerve hooks and pituitary rongeurs. The disc space was slightly entered and all loose seating of disc was removed. At this point a very thorough discectomy had been achieved. There is no evidence of injury to the thecal sac and nerve roots. Wound was then irrigated with saline solution. Gelfoam was placed topically for hemostasis. Microscope in her transition were removed. Hemostasis the muscle was achieved with electrocautery. Wounds and closed in layers with Vicryl sutures. Steri-Strips triggers were applied. There were no apparent complications. Patient tolerated the procedure well and she returned to the recovery room postoperative

## 2013-05-24 NOTE — Discharge Summary (Signed)
Physician Discharge Summary  Patient ID: Ashlee Hoffman MRN: [id number] DOB/AGE: 16-May-1981 32 y.o.  Admit date: 05/24/2013 Discharge date: 05/24/2013  Admission Diagnoses:  Discharge Diagnoses:  Principal Problem:   HNP (herniated nucleus pulposus), lumbar   Discharged Condition: good  Hospital Course: Patient admitted to the hospital where she underwent uncomplicated lumbar laminotomy and microdiscectomy. Postoperative she is doing well. She is up ambulating. She is voiding. She is ready for discharge home.  Consults:   Significant Diagnostic Studies:   Treatments:   Discharge Exam: Blood pressure 130/79, pulse 82, temperature 98.4 F (36.9 C), temperature source Oral, resp. rate 16, SpO2 100.00%. Awake and alert. Oriented and appropriate. Motor and sensory function intact. Wound clean and dry. Chest and abdomen benign.  Disposition: Final discharge disposition not confirmed     Medication List         cyclobenzaprine 10 MG tablet  Commonly known as:  FLEXERIL  Take 1 tablet (10 mg total) by mouth 3 (three) times daily as needed for muscle spasms.     HYDROcodone-acetaminophen 5-325 MG per tablet  Commonly known as:  NORCO/VICODIN  Take 1-2 tablets by mouth every 4 (four) hours as needed.     medroxyPROGESTERone 10 MG tablet  Commonly known as:  PROVERA  Take 10 mg by mouth daily.     meloxicam 7.5 MG tablet  Commonly known as:  MOBIC  Take 7.5 mg by mouth daily.     TYLENOL ARTHRITIS EXT RELIEF PO  Take 1-2 tablets by mouth as needed.           Follow-up Information   Call Temple Pacini, MD.   Specialty:  Neurosurgery   Contact information:   [address] [place] [address] [phone]       Signed: Temple Pacini 05/24/2013, 6:21 PM

## 2013-05-24 NOTE — H&P (Signed)
Ashlee Hoffman is an 32 y.o. female.   Chief Complaint: Left leg pain HPI: 32 year old female with back and left lower extremity pain failing conservative management. Workup demonstrates evidence of a large left-sided L5-S1 disc herniation. Patient presents now for microdiscectomy in hopes of improving her symptoms.  Past Medical History  Diagnosis Date  . Shortness of breath     with exertion  . Mental disability     Past Surgical History  Procedure Laterality Date  . No past surgeries      Family History  Problem Relation Age of Onset  . Hypertension Mother   . Arthritis Mother   . Diabetes Maternal Grandfather   . Hypertension Maternal Grandfather   . Heart disease Maternal Grandfather    Social History:  reports that she has never smoked. She does not have any smokeless tobacco history on file. She reports that she does not drink alcohol or use illicit drugs.  Allergies:  Allergies  Allergen Reactions  . Septra [Sulfamethoxazole W-Trimethoprim] Itching    Medications Prior to Admission  Medication Sig Dispense Refill  . Acetaminophen (TYLENOL ARTHRITIS EXT RELIEF PO) Take 1-2 tablets by mouth as needed.      . medroxyPROGESTERone (PROVERA) 10 MG tablet Take 10 mg by mouth daily.       . meloxicam (MOBIC) 7.5 MG tablet Take 7.5 mg by mouth daily.        No results found for this or any previous visit (from the past 48 hour(s)). No results found.  Review of Systems  Constitutional: Negative.   HENT: Negative.   Eyes: Negative.   Respiratory: Negative.   Cardiovascular: Negative.   Gastrointestinal: Negative.   Genitourinary: Negative.   Musculoskeletal: Negative.   Skin: Negative.   Neurological: Negative.   Endo/Heme/Allergies: Negative.   Psychiatric/Behavioral: Negative.     Blood pressure 149/79, pulse 88, temperature 97.4 F (36.3 C), temperature source Oral, resp. rate 18, SpO2 100.00%. Physical Exam  Constitutional: She is oriented to person, place,  and time. She appears well-developed and well-nourished.  HENT:  Head: Normocephalic and atraumatic.  Right Ear: External ear normal.  Left Ear: External ear normal.  Nose: Nose normal.  Mouth/Throat: Oropharynx is clear and moist.  Eyes: Conjunctivae and EOM are normal. Pupils are equal, round, and reactive to light. Right eye exhibits no discharge. Left eye exhibits no discharge.  Neck: Normal range of motion. Neck supple. No tracheal deviation present. No thyromegaly present.  Cardiovascular: Normal rate, regular rhythm, normal heart sounds and intact distal pulses.  Exam reveals no friction rub.   No murmur heard. Respiratory: Effort normal. No respiratory distress. She has no wheezes.  GI: Soft. Bowel sounds are normal. She exhibits no distension. There is no tenderness.  Musculoskeletal: Normal range of motion. She exhibits no edema and no tenderness.  Neurological: She is alert and oriented to person, place, and time. She has normal reflexes. She displays normal reflexes. No cranial nerve deficit. She exhibits normal muscle tone. Coordination normal.  Skin: Skin is warm and dry. No rash noted. No erythema. No pallor.  Psychiatric: She has a normal mood and affect. Her behavior is normal. Judgment and thought content normal.     Assessment/Plan Left L5-S1 herniated pulposus with radiculopathy. Plan left L5-S1 laminotomy and microdiscectomy. Risks and benefits been explained. Patient wishes to proceed.  Autumn Pruitt A 05/24/2013, 10:40 AM

## 2013-05-24 NOTE — Anesthesia Procedure Notes (Signed)
Procedure Name: Intubation Date/Time: 05/24/2013 11:11 AM Performed by: Gayla Medicus Pre-anesthesia Checklist: Patient identified, Timeout performed, Emergency Drugs available, Suction available and Patient being monitored Patient Re-evaluated:Patient Re-evaluated prior to inductionOxygen Delivery Method: Circle system utilized Preoxygenation: Pre-oxygenation with 100% oxygen Intubation Type: IV induction Ventilation: Mask ventilation without difficulty and Oral airway inserted - appropriate to patient size Laryngoscope Size: Mac and 3 Grade View: Grade III Tube type: Oral Tube size: 7.5 mm Number of attempts: 1 Airway Equipment and Method: Stylet Placement Confirmation: ETT inserted through vocal cords under direct vision,  positive ETCO2 and breath sounds checked- equal and bilateral Secured at: 22 cm Tube secured with: Tape Dental Injury: Teeth and Oropharynx as per pre-operative assessment

## 2013-05-24 NOTE — Anesthesia Postprocedure Evaluation (Signed)
  Anesthesia Post-op Note  Patient: Ashlee Hoffman  Procedure(s) Performed: Procedure(s) with comments: LUMBAR LAMINECTOMY/DECOMPRESSION MICRODISCECTOMY 1 LEVEL LEFT LUMBAR FIVE-SACARAL-ONE (N/A) - left  Patient Location: PACU  Anesthesia Type:General  Level of Consciousness: awake, alert  and oriented  Airway and Oxygen Therapy: Patient Spontanous Breathing and Patient connected to nasal cannula oxygen  Post-op Pain: mild  Post-op Assessment: Post-op Vital signs reviewed, Patient's Cardiovascular Status Stable, Respiratory Function Stable, Patent Airway and Pain level controlled  Post-op Vital Signs: stable  Complications: No apparent anesthesia complications

## 2013-05-25 ENCOUNTER — Encounter (HOSPITAL_COMMUNITY): Payer: Self-pay | Admitting: Neurosurgery

## 2013-07-22 ENCOUNTER — Other Ambulatory Visit: Payer: Self-pay

## 2013-07-26 ENCOUNTER — Other Ambulatory Visit: Payer: Self-pay | Admitting: Nurse Practitioner

## 2013-09-05 ENCOUNTER — Encounter: Payer: Self-pay | Admitting: Family Medicine

## 2013-09-06 ENCOUNTER — Other Ambulatory Visit: Payer: Self-pay | Admitting: Nurse Practitioner

## 2013-09-06 MED ORDER — MEDROXYPROGESTERONE ACETATE 10 MG PO TABS: 10.0000 mg | ORAL_TABLET | Freq: Every day | ORAL | Status: DC

## 2013-09-13 ENCOUNTER — Ambulatory Visit: Payer: Medicare Other | Admitting: Family Medicine

## 2013-09-22 DIAGNOSIS — M5126 Other intervertebral disc displacement, lumbar region: Secondary | ICD-10-CM | POA: Diagnosis not present

## 2013-12-13 ENCOUNTER — Encounter: Payer: Self-pay | Admitting: Family Medicine

## 2013-12-13 ENCOUNTER — Ambulatory Visit (INDEPENDENT_AMBULATORY_CARE_PROVIDER_SITE_OTHER): Payer: Medicare Other | Admitting: Family Medicine

## 2013-12-13 VITALS — BP 128/75 | HR 67 | Temp 97.4°F | Ht 63.0 in | Wt 240.8 lb

## 2013-12-13 DIAGNOSIS — M7520 Bicipital tendinitis, unspecified shoulder: Secondary | ICD-10-CM

## 2013-12-13 DIAGNOSIS — M7521 Bicipital tendinitis, right shoulder: Secondary | ICD-10-CM

## 2013-12-13 MED ORDER — MELOXICAM 7.5 MG PO TABS: 7.5000 mg | ORAL_TABLET | Freq: Every day | ORAL | Status: DC

## 2013-12-13 NOTE — Progress Notes (Signed)
   Subjective:    Patient ID: Ashlee Hoffman, female    DOB: Apr 11, 1981, 33 y.o.   MRN: [id number]  HPI   This 33 y.o. female presents for evaluation of right arm discomfort and right wrist discomfort.  Review of Systems No chest pain, SOB, HA, dizziness, vision change, N/V, diarrhea, constipation, dysuria, urinary urgency or frequency, myalgias, arthralgias or rash.     Objective:   Physical Exam Vital signs noted  Well developed well nourished female.  HEENT - Head atraumatic Normocephalic                Eyes - PERRLA, Conjuctiva - clear Sclera- Clear EOMI                Ears - EAC's Wnl TM's Wnl Gross Hearing WNL                 Throat - oropharanx wnl Respiratory - Lungs CTA bilateral Cardiac - RRR S1 and S2 without murmur GI - Abdomen soft Nontender and bowel sounds active x 4 MS - TTP right biceps tendon       Assessment & Plan:  Biceps tendonitis on right - Plan: meloxicam (MOBIC) 7.5 MG tablet  Lysbeth Penner FNP

## 2014-02-02 ENCOUNTER — Telehealth: Payer: Self-pay | Admitting: Nurse Practitioner

## 2014-02-02 NOTE — Telephone Encounter (Signed)
Appt given for next Wednesday per patient request

## 2014-02-09 ENCOUNTER — Ambulatory Visit: Payer: Medicare Other | Admitting: Nurse Practitioner

## 2014-02-09 ENCOUNTER — Ambulatory Visit (INDEPENDENT_AMBULATORY_CARE_PROVIDER_SITE_OTHER): Payer: Medicare Other | Admitting: Physician Assistant

## 2014-02-09 ENCOUNTER — Encounter: Payer: Self-pay | Admitting: Physician Assistant

## 2014-02-09 ENCOUNTER — Ambulatory Visit (INDEPENDENT_AMBULATORY_CARE_PROVIDER_SITE_OTHER): Payer: Medicare Other

## 2014-02-09 VITALS — BP 131/80 | HR 61 | Temp 97.8°F | Ht 63.0 in | Wt 241.0 lb

## 2014-02-09 DIAGNOSIS — M25529 Pain in unspecified elbow: Secondary | ICD-10-CM | POA: Diagnosis not present

## 2014-02-09 NOTE — Patient Instructions (Signed)

## 2014-02-09 NOTE — Progress Notes (Signed)
Subjective:     Patient ID: Ashlee Hoffman, female   DOB: 04-Jul-1981, 33 y.o.   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

## 2014-04-21 ENCOUNTER — Other Ambulatory Visit: Payer: Self-pay | Admitting: Nurse Practitioner

## 2014-05-21 ENCOUNTER — Other Ambulatory Visit: Payer: Self-pay | Admitting: Nurse Practitioner

## 2014-06-20 ENCOUNTER — Other Ambulatory Visit: Payer: Self-pay | Admitting: Nurse Practitioner

## 2014-07-22 ENCOUNTER — Other Ambulatory Visit: Payer: Self-pay | Admitting: Nurse Practitioner

## 2014-08-19 ENCOUNTER — Other Ambulatory Visit: Payer: Self-pay | Admitting: Nurse Practitioner

## 2014-08-22 ENCOUNTER — Other Ambulatory Visit: Payer: Self-pay | Admitting: Family Medicine

## 2014-09-20 ENCOUNTER — Other Ambulatory Visit: Payer: Self-pay | Admitting: Family Medicine

## 2014-09-21 MED ORDER — MEDROXYPROGESTERONE ACETATE 10 MG PO TABS: 10.0000 mg | ORAL_TABLET | Freq: Every day | ORAL | Status: DC

## 2014-10-25 ENCOUNTER — Other Ambulatory Visit: Payer: Self-pay | Admitting: Family Medicine

## 2014-11-22 ENCOUNTER — Other Ambulatory Visit: Payer: Self-pay | Admitting: Family Medicine

## 2014-11-23 NOTE — Telephone Encounter (Signed)
Last seen 02/09/14 [facility]

## 2014-11-28 ENCOUNTER — Ambulatory Visit (INDEPENDENT_AMBULATORY_CARE_PROVIDER_SITE_OTHER): Payer: Medicare Other | Admitting: Nurse Practitioner

## 2014-11-28 ENCOUNTER — Encounter: Payer: Self-pay | Admitting: Nurse Practitioner

## 2014-11-28 VITALS — BP 111/72 | HR 80 | Temp 96.7°F | Ht 63.0 in | Wt 232.0 lb

## 2014-11-28 DIAGNOSIS — Z3049 Encounter for surveillance of other contraceptives: Secondary | ICD-10-CM

## 2014-11-28 MED ORDER — MEDROXYPROGESTERONE ACETATE 10 MG PO TABS: 10.0000 mg | ORAL_TABLET | Freq: Every day | ORAL | Status: DC

## 2014-11-28 NOTE — Patient Instructions (Signed)
Contraception Choices Contraception (birth control) is the use of any methods or devices to prevent pregnancy. Below are some methods to help avoid pregnancy. HORMONAL METHODS   Contraceptive implant. This is a thin, plastic tube containing progesterone hormone. It does not contain estrogen hormone. Your health care provider inserts the tube in the inner part of the upper arm. The tube can remain in place for up to 3 years. After 3 years, the implant must be removed. The implant prevents the ovaries from releasing an egg (ovulation), thickens the cervical mucus to prevent sperm from entering the uterus, and thins the lining of the inside of the uterus.  Progesterone-only injections. These injections are given every 3 months by your health care provider to prevent pregnancy. This synthetic progesterone hormone stops the ovaries from releasing eggs. It also thickens cervical mucus and changes the uterine lining. This makes it harder for sperm to survive in the uterus.  Birth control pills. These pills contain estrogen and progesterone hormone. They work by preventing the ovaries from releasing eggs (ovulation). They also cause the cervical mucus to thicken, preventing the sperm from entering the uterus. Birth control pills are prescribed by a health care provider.Birth control pills can also be used to treat heavy periods.  Minipill. This type of birth control pill contains only the progesterone hormone. They are taken every day of each month and must be prescribed by your health care provider.  Birth control patch. The patch contains hormones similar to those in birth control pills. It must be changed once a week and is prescribed by a health care provider.  Vaginal ring. The ring contains hormones similar to those in birth control pills. It is left in the vagina for 3 weeks, removed for 1 week, and then a new one is put back in place. The patient must be comfortable inserting and removing the ring  from the vagina.A health care provider's prescription is necessary.  Emergency contraception. Emergency contraceptives prevent pregnancy after unprotected sexual intercourse. This pill can be taken right after sex or up to 5 days after unprotected sex. It is most effective the sooner you take the pills after having sexual intercourse. Most emergency contraceptive pills are available without a prescription. Check with your pharmacist. Do not use emergency contraception as your only form of birth control. BARRIER METHODS   Female condom. This is a thin sheath (latex or rubber) that is worn over the penis during sexual intercourse. It can be used with spermicide to increase effectiveness.  Female condom. This is a soft, loose-fitting sheath that is put into the vagina before sexual intercourse.  Diaphragm. This is a soft, latex, dome-shaped barrier that must be fitted by a health care provider. It is inserted into the vagina, along with a spermicidal jelly. It is inserted before intercourse. The diaphragm should be left in the vagina for 6 to 8 hours after intercourse.  Cervical cap. This is a round, soft, latex or plastic cup that fits over the cervix and must be fitted by a health care provider. The cap can be left in place for up to 48 hours after intercourse.  Sponge. This is a soft, circular piece of polyurethane foam. The sponge has spermicide in it. It is inserted into the vagina after wetting it and before sexual intercourse.  Spermicides. These are chemicals that kill or block sperm from entering the cervix and uterus. They come in the form of creams, jellies, suppositories, foam, or tablets. They do not require a   prescription. They are inserted into the vagina with an applicator before having sexual intercourse. The process must be repeated every time you have sexual intercourse. INTRAUTERINE CONTRACEPTION  Intrauterine device (IUD). This is a T-shaped device that is put in a woman's uterus  during a menstrual period to prevent pregnancy. There are 2 types:  Copper IUD. This type of IUD is wrapped in copper wire and is placed inside the uterus. Copper makes the uterus and fallopian tubes produce a fluid that kills sperm. It can stay in place for 10 years.  Hormone IUD. This type of IUD contains the hormone progestin (synthetic progesterone). The hormone thickens the cervical mucus and prevents sperm from entering the uterus, and it also thins the uterine lining to prevent implantation of a fertilized egg. The hormone can weaken or kill the sperm that get into the uterus. It can stay in place for 3-5 years, depending on which type of IUD is used. PERMANENT METHODS OF CONTRACEPTION  Female tubal ligation. This is when the woman's fallopian tubes are surgically sealed, tied, or blocked to prevent the egg from traveling to the uterus.  Hysteroscopic sterilization. This involves placing a small coil or insert into each fallopian tube. Your doctor uses a technique called hysteroscopy to do the procedure. The device causes scar tissue to form. This results in permanent blockage of the fallopian tubes, so the sperm cannot fertilize the egg. It takes about 3 months after the procedure for the tubes to become blocked. You must use another form of birth control for these 3 months.  Female sterilization. This is when the female has the tubes that carry sperm tied off (vasectomy).This blocks sperm from entering the vagina during sexual intercourse. After the procedure, the man can still ejaculate fluid (semen). NATURAL PLANNING METHODS  Natural family planning. This is not having sexual intercourse or using a barrier method (condom, diaphragm, cervical cap) on days the woman could become pregnant.  Calendar method. This is keeping track of the length of each menstrual cycle and identifying when you are fertile.  Ovulation method. This is avoiding sexual intercourse during ovulation.  Symptothermal  method. This is avoiding sexual intercourse during ovulation, using a thermometer and ovulation symptoms.  Post-ovulation method. This is timing sexual intercourse after you have ovulated. Regardless of which type or method of contraception you choose, it is important that you use condoms to protect against the transmission of sexually transmitted infections (STIs). Talk with your health care provider about which form of contraception is most appropriate for you. Document Released: 09/02/2005 Document Revised: 09/07/2013 Document Reviewed: 02/25/2013 [employer] Patient Information 2015 [employer]. This information is not intended to replace advice given to you by your health care provider. Make sure you discuss any questions you have with your health care provider.  

## 2014-11-28 NOTE — Progress Notes (Signed)
   Subjective:    Patient ID: Ashlee Hoffman, female    DOB: Aug 23, 1981, 34 y.o.   MRN: [id number]  HPI Patient is here today for birth control refill. She is currently taking provera.   Review of Systems  Constitutional: Negative.   HENT: Negative.   Eyes: Negative.   Respiratory: Negative.   Cardiovascular: Negative.   Gastrointestinal: Negative.   Endocrine: Negative.   Genitourinary: Negative.   Musculoskeletal: Negative.   Skin: Negative.   Allergic/Immunologic: Negative.   Neurological: Negative.   Hematological: Negative.   Psychiatric/Behavioral: Negative.        Objective:   Physical Exam  Constitutional: She is oriented to person, place, and time. She appears well-developed and well-nourished.  HENT:  Head: Normocephalic.  Neck: Normal range of motion.  Cardiovascular: Normal rate.   Pulmonary/Chest: Effort normal.  Musculoskeletal: Normal range of motion.  Neurological: She is alert and oriented to person, place, and time.  Skin: Skin is warm.  Psychiatric: She has a normal mood and affect.    BP 111/72 mmHg  Pulse 80  Temp(Src) 96.7 F (35.9 C) (Oral)  Ht 5\' 3"  (1.6 m)  Wt 232 lb (105.235 kg)  BMI 41.11 kg/m2      Assessment & Plan:   1. Encounter for surveillance of other contraceptive    Meds ordered this encounter  Medications  . DISCONTD: medroxyPROGESTERone (PROVERA) 10 MG tablet    Sig: Take 10 mg by mouth daily.  . medroxyPROGESTERone (PROVERA) 10 MG tablet    Sig: Take 1 tablet (10 mg total) by mouth daily.    Dispense:  30 tablet    Refill:  11    Order Specific Question:  Supervising Provider    Answer:  Chipper Herb [[id number]]    Melvin, FNP

## 2014-12-13 ENCOUNTER — Ambulatory Visit: Payer: Medicare Other | Admitting: *Deleted

## 2014-12-20 ENCOUNTER — Ambulatory Visit (INDEPENDENT_AMBULATORY_CARE_PROVIDER_SITE_OTHER): Payer: Medicare Other | Admitting: *Deleted

## 2014-12-20 ENCOUNTER — Encounter: Payer: Self-pay | Admitting: *Deleted

## 2014-12-20 VITALS — BP 117/81 | HR 75 | Ht 63.0 in | Wt 233.0 lb

## 2014-12-20 DIAGNOSIS — Z23 Encounter for immunization: Secondary | ICD-10-CM

## 2014-12-20 DIAGNOSIS — Z0000 Encounter for general adult medical examination without abnormal findings: Secondary | ICD-10-CM

## 2014-12-20 MED ORDER — TETANUS-DIPHTH-ACELL PERTUSSIS 5-2.5-18.5 LF-MCG/0.5 IM SUSP: 0.5000 mL | Freq: Once | INTRAMUSCULAR | Status: DC

## 2014-12-20 NOTE — Progress Notes (Signed)
Subjective:   Ashlee Hoffman is a 34 y.o. female who presents for an Initial Medicare Annual Wellness Visit.  She is accompanied by her mother for the visit today.  Patient has some mental disabilities and lives with her mother and step father.  She also spends time with her father and step mother at their home.       Objective:    Today's Vitals   12/20/14 1414  BP: 117/81  Pulse: 75  Height: 5\' 3"  (1.6 m)  Weight: 233 lb (105.688 kg)  PainSc: 2   PainLoc: Wrist    Current Medications (verified) Outpatient Encounter Prescriptions as of 12/20/2014  Medication Sig  . Acetaminophen (TYLENOL ARTHRITIS EXT RELIEF PO) Take 1-2 tablets by mouth as needed.  . medroxyPROGESTERone (PROVERA) 10 MG tablet Take 1 tablet (10 mg total) by mouth daily.  . meloxicam (MOBIC) 7.5 MG tablet Take 1 tablet (7.5 mg total) by mouth daily. (Patient not taking: Reported on 11/28/2014)  . [DISCONTINUED] medroxyPROGESTERone (PROVERA) 10 MG tablet Take 1 tablet (10 mg total) by mouth daily.  . [DISCONTINUED] Tdap (BOOSTRIX) injection 0.5 mL     Allergies (verified) Septra   History: Past Medical History  Diagnosis Date  . Shortness of breath     with exertion  . Mental disability    Past Surgical History  Procedure Laterality Date  . No past surgeries    . Lumbar laminectomy/decompression microdiscectomy N/A 05/24/2013    Procedure: LUMBAR LAMINECTOMY/DECOMPRESSION MICRODISCECTOMY 1 LEVEL LEFT LUMBAR FIVE-SACARAL-ONE;  Surgeon: Charlie Pitter, MD;  Location: [facility];  Service: Neurosurgery;  Laterality: N/A;  left   Family History  Problem Relation Age of Onset  . Hypertension Mother   . Arthritis Mother   . Diabetes Maternal Grandfather   . Hypertension Maternal Grandfather   . Heart disease Maternal Grandfather      Tobacco Counseling Counseling given: No   Activities of Daily Living In your present state of health, do you have any difficulty performing the following activities:  12/20/2014  Is the patient deaf or have difficulty hearing? N  Hearing N  Vision N  Difficulty concentrating or making decisions N  Walking or climbing stairs? N  Doing errands, shopping? Y  Preparing Food and eating ? N  Using the Toilet? N  In the past six months, have you accidently leaked urine? N  Do you have problems with loss of bowel control? N  Managing your Medications? N  Managing your Finances? Y  Housekeeping or managing your Housekeeping? Y    Immunizations and Health Maintenance There is no immunization history for the selected administration types on file for this patient. Health Maintenance Due  Topic Date Due  . HIV Screening  02/21/1997  . PAP SMEAR  02/22/2000  . TETANUS/TDAP  02/21/2001    Patient Care Team: Chevis Pretty, FNP as PCP - General (Nurse Practitioner)       Assessment:   This is a routine wellness examination for [place].   Hearing/Vision screen Patient last had her vision screened by Dr. Felton Clinton in [place], she did not require correction with glasses or contacts, and complains of no problems with her vision. No hearing deficit noted during today's exam.   Dietary issues and exercise activities discussed: Current Exercise Habits:: The patient does not participate in regular exercise at present   Depression Screen PHQ 2/9 Scores 12/20/2014 11/28/2014 12/13/2013  PHQ - 2 Score 0 0 0    Fall Risk Fall Risk  12/20/2014 11/28/2014 12/13/2013  Falls in the past year? No No No    Cognitive Function: MMSE - Mini Mental State Exam 12/20/2014  Not completed: Refused  Orientation to time 4  Orientation to Place 3  Registration 3  Attention/ Calculation 0  Recall 1  Language- name 2 objects 2  Language- repeat 1  Language- follow 3 step command 1  Language- read & follow direction 1  Write a sentence 1  Copy design 0  Total score 17   Error- test was completed, not refused  Screening Tests Health Maintenance  Topic Date Due  . HIV  Screening  02/21/1997  . PAP SMEAR  02/22/2000  . TETANUS/TDAP  02/21/2001  . INFLUENZA VACCINE  04/17/2015   Last pap negative 07/23/10.  Patient is not sexually active.  Discussed with Chevis Pretty, FNP and she will discuss Pap smear and HIV screening with patient at her next visit.       Plan:     Try to increase exercise to 30 minutes of walking most days of the week. Patient's mother to investigate DASH diet to help with weight loss.  During the course of the visit, Lanore was educated and counseled about the following appropriate screening and preventive services:   Vaccines to include Tdap given today  Nutrition counseling   Patient Instructions (the written plan) were given to the patient.    WYATT, AMY M, RN   12/20/2014      I have reviewed and agree with the above AWV documentation.  Claretta Fraise, M.D.

## 2014-12-20 NOTE — Patient Instructions (Signed)
Preventive Care for Adults A healthy lifestyle and preventive care can promote health and wellness. Preventive health guidelines for women include the following key practices.  A routine yearly physical is a good way to check with your health care provider about your health and preventive screening. It is a chance to share any concerns and updates on your health and to receive a thorough exam.  Visit your dentist for a routine exam and preventive care every 6 months. Brush your teeth twice a day and floss once a day. Good oral hygiene prevents tooth decay and gum disease.  The frequency of eye exams is based on your age, health, family medical history, use of contact lenses, and other factors. Follow your health care provider's recommendations for frequency of eye exams.  Eat a healthy diet. Foods like vegetables, fruits, whole grains, low-fat dairy products, and lean protein foods contain the nutrients you need without too many calories. Decrease your intake of foods high in solid fats, added sugars, and salt. Eat the right amount of calories for you.Get information about a proper diet from your health care provider, if necessary.  Regular physical exercise is one of the most important things you can do for your health. Most adults should get at least 150 minutes of moderate-intensity exercise (any activity that increases your heart rate and causes you to sweat) each week. In addition, most adults need muscle-strengthening exercises on 2 or more days a week.  Maintain a healthy weight. The body mass index (BMI) is a screening tool to identify possible weight problems. It provides an estimate of body fat based on height and weight. Your health care provider can find your BMI and can help you achieve or maintain a healthy weight.For adults 20 years and older:  A BMI below 18.5 is considered underweight.  A BMI of 18.5 to 24.9 is normal.  A BMI of 25 to 29.9 is considered overweight.  A BMI of  30 and above is considered obese.  Maintain normal blood lipids and cholesterol levels by exercising and minimizing your intake of saturated fat. Eat a balanced diet with plenty of fruit and vegetables. Blood tests for lipids and cholesterol should begin at age 20 and be repeated every 5 years. If your lipid or cholesterol levels are high, you are over 50, or you are at high risk for heart disease, you may need your cholesterol levels checked more frequently.Ongoing high lipid and cholesterol levels should be treated with medicines if diet and exercise are not working.  If you smoke, find out from your health care provider how to quit. If you do not use tobacco, do not start.  Lung cancer screening is recommended for adults aged 55-80 years who are at high risk for developing lung cancer because of a history of smoking. A yearly low-dose CT scan of the lungs is recommended for people who have at least a 30-pack-year history of smoking and are a current smoker or have quit within the past 15 years. A pack year of smoking is smoking an average of 1 pack of cigarettes a day for 1 year (for example: 1 pack a day for 30 years or 2 packs a day for 15 years). Yearly screening should continue until the smoker has stopped smoking for at least 15 years. Yearly screening should be stopped for people who develop a health problem that would prevent them from having lung cancer treatment.  If you are pregnant, do not drink alcohol. If you are breastfeeding,   breastfeeding, be very cautious about drinking alcohol. If you are not pregnant and choose to drink alcohol, do not have more than 1 drink per day. One drink is considered to be 12 ounces (355 mL) of beer, 5 ounces (148 mL) of wine, or 1.5 ounces (44 mL) of liquor.  Avoid use of street drugs. Do not share needles with anyone. Ask for help if you need support or instructions about stopping the use of drugs.  High blood pressure causes heart disease and increases the risk of  stroke. Your blood pressure should be checked at least every 1 to 2 years. Ongoing high blood pressure should be treated with medicines if weight loss and exercise do not work.  If you are 3-31 years old, ask your health care provider if you should take aspirin to prevent strokes.  Diabetes screening involves taking a blood sample to check your fasting blood sugar level. This should be done once every 3 years, after age 31, if you are within normal weight and without risk factors for diabetes. Testing should be considered at a younger age or be carried out more frequently if you are overweight and have at least 1 risk factor for diabetes.  Breast cancer screening is essential preventive care for women. You should practice "breast self-awareness." This means understanding the normal appearance and feel of your breasts and may include breast self-examination. Any changes detected, no matter how small, should be reported to a health care provider. Women in their 76s and 30s should have a clinical breast exam (CBE) by a health care provider as part of a regular health exam every 1 to 3 years. After age 65, women should have a CBE every year. Starting at age 67, women should consider having a mammogram (breast X-ray test) every year. Women who have a family history of breast cancer should talk to their health care provider about genetic screening. Women at a high risk of breast cancer should talk to their health care providers about having an MRI and a mammogram every year.  Breast cancer gene (BRCA)-related cancer risk assessment is recommended for women who have family members with BRCA-related cancers. BRCA-related cancers include breast, ovarian, tubal, and peritoneal cancers. Having family members with these cancers may be associated with an increased risk for harmful changes (mutations) in the breast cancer genes BRCA1 and BRCA2. Results of the assessment will determine the need for genetic counseling and  BRCA1 and BRCA2 testing.  Routine pelvic exams to screen for cancer are no longer recommended for nonpregnant women who are considered low risk for cancer of the pelvic organs (ovaries, uterus, and vagina) and who do not have symptoms. Ask your health care provider if a screening pelvic exam is right for you.  If you have had past treatment for cervical cancer or a condition that could lead to cancer, you need Pap tests and screening for cancer for at least 20 years after your treatment. If Pap tests have been discontinued, your risk factors (such as having a new sexual partner) need to be reassessed to determine if screening should be resumed. Some women have medical problems that increase the chance of getting cervical cancer. In these cases, your health care provider may recommend more frequent screening and Pap tests.  The HPV test is an additional test that may be used for cervical cancer screening. The HPV test looks for the virus that can cause the cell changes on the cervix. The cells collected during the Pap test can  be tested for HPV. The HPV test could be used to screen women aged 98 years and older, and should be used in women of any age who have unclear Pap test results. After the age of 59, women should have HPV testing at the same frequency as a Pap test.  Colorectal cancer can be detected and often prevented. Most routine colorectal cancer screening begins at the age of 35 years and continues through age 61 years. However, your health care provider may recommend screening at an earlier age if you have risk factors for colon cancer. On a yearly basis, your health care provider may provide home test kits to check for hidden blood in the stool. Use of a small camera at the end of a tube, to directly examine the colon (sigmoidoscopy or colonoscopy), can detect the earliest forms of colorectal cancer. Talk to your health care provider about this at age 18, when routine screening begins. Direct  exam of the colon should be repeated every 5-10 years through age 67 years, unless early forms of pre-cancerous polyps or small growths are found.  People who are at an increased risk for hepatitis B should be screened for this virus. You are considered at high risk for hepatitis B if:  You were born in a country where hepatitis B occurs often. Talk with your health care provider about which countries are considered high risk.  Your parents were born in a high-risk country and you have not received a shot to protect against hepatitis B (hepatitis B vaccine).  You have HIV or AIDS.  You use needles to inject street drugs.  You live with, or have sex with, someone who has hepatitis B.  You get hemodialysis treatment.  You take certain medicines for conditions like cancer, organ transplantation, and autoimmune conditions.  Hepatitis C blood testing is recommended for all people born from [birth year] through 1965 and any individual with known risks for hepatitis C.  Practice safe sex. Use condoms and avoid high-risk sexual practices to reduce the spread of sexually transmitted infections (STIs). STIs include gonorrhea, chlamydia, syphilis, trichomonas, herpes, HPV, and human immunodeficiency virus (HIV). Herpes, HIV, and HPV are viral illnesses that have no cure. They can result in disability, cancer, and death.  You should be screened for sexually transmitted illnesses (STIs) including gonorrhea and chlamydia if:  You are sexually active and are younger than 24 years.  You are older than 24 years and your health care provider tells you that you are at risk for this type of infection.  Your sexual activity has changed since you were last screened and you are at an increased risk for chlamydia or gonorrhea. Ask your health care provider if you are at risk.  If you are at risk of being infected with HIV, it is recommended that you take a prescription medicine daily to prevent HIV infection. This is  called preexposure prophylaxis (PrEP). You are considered at risk if:  You are a heterosexual woman, are sexually active, and are at increased risk for HIV infection.  You take drugs by injection.  You are sexually active with a partner who has HIV.  Talk with your health care provider about whether you are at high risk of being infected with HIV. If you choose to begin PrEP, you should first be tested for HIV. You should then be tested every 3 months for as long as you are taking PrEP.  Osteoporosis is a disease in which the bones lose minerals and  strength with aging. This can result in serious bone fractures or breaks. The risk of osteoporosis can be identified using a bone density scan. Women ages 48 years and over and women at risk for fractures or osteoporosis should discuss screening with their health care providers. Ask your health care provider whether you should take a calcium supplement or vitamin D to reduce the rate of osteoporosis.  Menopause can be associated with physical symptoms and risks. Hormone replacement therapy is available to decrease symptoms and risks. You should talk to your health care provider about whether hormone replacement therapy is right for you.  Use sunscreen. Apply sunscreen liberally and repeatedly throughout the day. You should seek shade when your shadow is shorter than you. Protect yourself by wearing long sleeves, pants, a wide-brimmed hat, and sunglasses year round, whenever you are outdoors.  Once a month, do a whole body skin exam, using a mirror to look at the skin on your back. Tell your health care provider of new moles, moles that have irregular borders, moles that are larger than a pencil eraser, or moles that have changed in shape or color.  Stay current with required vaccines (immunizations).  Influenza vaccine. All adults should be immunized every year.  Tetanus, diphtheria, and acellular pertussis (Td, Tdap) vaccine. Pregnant women should  receive 1 dose of Tdap vaccine during each pregnancy. The dose should be obtained regardless of the length of time since the last dose. Immunization is preferred during the 27th-36th week of gestation. An adult who has not previously received Tdap or who does not know her vaccine status should receive 1 dose of Tdap. This initial dose should be followed by tetanus and diphtheria toxoids (Td) booster doses every 10 years. Adults with an unknown or incomplete history of completing a 3-dose immunization series with Td-containing vaccines should begin or complete a primary immunization series including a Tdap dose. Adults should receive a Td booster every 10 years.  Varicella vaccine. An adult without evidence of immunity to varicella should receive 2 doses or a second dose if she has previously received 1 dose. Pregnant females who do not have evidence of immunity should receive the first dose after pregnancy. This first dose should be obtained before leaving the health care facility. The second dose should be obtained 4-8 weeks after the first dose.  Human papillomavirus (HPV) vaccine. Females aged 13-26 years who have not received the vaccine previously should obtain the 3-dose series. The vaccine is not recommended for use in pregnant females. However, pregnancy testing is not needed before receiving a dose. If a female is found to be pregnant after receiving a dose, no treatment is needed. In that case, the remaining doses should be delayed until after the pregnancy. Immunization is recommended for any person with an immunocompromised condition through the age of 48 years if she did not get any or all doses earlier. During the 3-dose series, the second dose should be obtained 4-8 weeks after the first dose. The third dose should be obtained 24 weeks after the first dose and 16 weeks after the second dose.  Zoster vaccine. One dose is recommended for adults aged 67 years or older unless certain conditions are  present.  Measles, mumps, and rubella (MMR) vaccine. Adults born before [birth year] generally are considered immune to measles and mumps. Adults born in [birth year] or later should have 1 or more doses of MMR vaccine unless there is a contraindication to the vaccine or there is laboratory evidence of immunity  to each of the three diseases. A routine second dose of MMR vaccine should be obtained at least 28 days after the first dose for students attending postsecondary schools, health care workers, or international travelers. People who received inactivated measles vaccine or an unknown type of measles vaccine during 1963-1967 should receive 2 doses of MMR vaccine. People who received inactivated mumps vaccine or an unknown type of mumps vaccine before 1979 and are at high risk for mumps infection should consider immunization with 2 doses of MMR vaccine. For females of childbearing age, rubella immunity should be determined. If there is no evidence of immunity, females who are not pregnant should be vaccinated. If there is no evidence of immunity, females who are pregnant should delay immunization until after pregnancy. Unvaccinated health care workers born before [birth year] who lack laboratory evidence of measles, mumps, or rubella immunity or laboratory confirmation of disease should consider measles and mumps immunization with 2 doses of MMR vaccine or rubella immunization with 1 dose of MMR vaccine.  Pneumococcal 13-valent conjugate (PCV13) vaccine. When indicated, a person who is uncertain of her immunization history and has no record of immunization should receive the PCV13 vaccine. An adult aged 58 years or older who has certain medical conditions and has not been previously immunized should receive 1 dose of PCV13 vaccine. This PCV13 should be followed with a dose of pneumococcal polysaccharide (PPSV23) vaccine. The PPSV23 vaccine dose should be obtained at least 8 weeks after the dose of PCV13 vaccine. An adult aged 65  years or older who has certain medical conditions and previously received 1 or more doses of PPSV23 vaccine should receive 1 dose of PCV13. The PCV13 vaccine dose should be obtained 1 or more years after the last PPSV23 vaccine dose.  Pneumococcal polysaccharide (PPSV23) vaccine. When PCV13 is also indicated, PCV13 should be obtained first. All adults aged 18 years and older should be immunized. An adult younger than age 74 years who has certain medical conditions should be immunized. Any person who resides in a nursing home or long-term care facility should be immunized. An adult smoker should be immunized. People with an immunocompromised condition and certain other conditions should receive both PCV13 and PPSV23 vaccines. People with human immunodeficiency virus (HIV) infection should be immunized as soon as possible after diagnosis. Immunization during chemotherapy or radiation therapy should be avoided. Routine use of PPSV23 vaccine is not recommended for American Indians, [place] Natives, or people younger than 65 years unless there are medical conditions that require PPSV23 vaccine. When indicated, people who have unknown immunization and have no record of immunization should receive PPSV23 vaccine. One-time revaccination 5 years after the first dose of PPSV23 is recommended for people aged 19-64 years who have chronic kidney failure, nephrotic syndrome, asplenia, or immunocompromised conditions. People who received 1-2 doses of PPSV23 before age 21 years should receive another dose of PPSV23 vaccine at age 42 years or later if at least 5 years have passed since the previous dose. Doses of PPSV23 are not needed for people immunized with PPSV23 at or after age 61 years.  Meningococcal vaccine. Adults with asplenia or persistent complement component deficiencies should receive 2 doses of quadrivalent meningococcal conjugate (MenACWY-D) vaccine. The doses should be obtained at least 2 months apart.  Microbiologists working with certain meningococcal bacteria, [employer] recruits, people at risk during an outbreak, and people who travel to or live in countries with a high rate of meningitis should be immunized. A first-year college student up through  age 18 years who is living in a residence hall should receive a dose if she did not receive a dose on or after her 16th birthday. Adults who have certain high-risk conditions should receive one or more doses of vaccine.  Hepatitis A vaccine. Adults who wish to be protected from this disease, have certain high-risk conditions, work with hepatitis A-infected animals, work in hepatitis A research labs, or travel to or work in countries with a high rate of hepatitis A should be immunized. Adults who were previously unvaccinated and who anticipate close contact with an international adoptee during the first 60 days after arrival in the [place] from a country with a high rate of hepatitis A should be immunized.  Hepatitis B vaccine. Adults who wish to be protected from this disease, have certain high-risk conditions, may be exposed to blood or other infectious body fluids, are household contacts or sex partners of hepatitis B positive people, are clients or workers in certain care facilities, or travel to or work in countries with a high rate of hepatitis B should be immunized.  Haemophilus influenzae type b (Hib) vaccine. A previously unvaccinated person with asplenia or sickle cell disease or having a scheduled splenectomy should receive 1 dose of Hib vaccine. Regardless of previous immunization, a recipient of a hematopoietic stem cell transplant should receive a 3-dose series 6-12 months after her successful transplant. Hib vaccine is not recommended for adults with HIV infection. Preventive Services / Frequency Ages 27 to 79 years  Blood pressure check.** / Every 1 to 2 years.  Lipid and cholesterol check.** / Every 5 years beginning at age  67.  Clinical breast exam.** / Every 3 years for women in their 74s and 58s.  BRCA-related cancer risk assessment.** / For women who have family members with a BRCA-related cancer (breast, ovarian, tubal, or peritoneal cancers).  Pap test.** / Every 2 years from ages 44 through 27. Every 3 years starting at age 60 through age 16 or 79 with a history of 3 consecutive normal Pap tests.  HPV screening.** / Every 3 years from ages 8 through ages 63 to 77 with a history of 3 consecutive normal Pap tests.  Hepatitis C blood test.** / For any individual with known risks for hepatitis C.  Skin self-exam. / Monthly.  Influenza vaccine. / Every year.  Tetanus, diphtheria, and acellular pertussis (Tdap, Td) vaccine.** / Consult your health care provider. Pregnant women should receive 1 dose of Tdap vaccine during each pregnancy. 1 dose of Td every 10 years.  Varicella vaccine.** / Consult your health care provider. Pregnant females who do not have evidence of immunity should receive the first dose after pregnancy.  HPV vaccine. / 3 doses over 6 months, if 60 and younger. The vaccine is not recommended for use in pregnant females. However, pregnancy testing is not needed before receiving a dose.  Measles, mumps, rubella (MMR) vaccine.** / You need at least 1 dose of MMR if you were born in 1957 or later. You may also need a 2nd dose. For females of childbearing age, rubella immunity should be determined. If there is no evidence of immunity, females who are not pregnant should be vaccinated. If there is no evidence of immunity, females who are pregnant should delay immunization until after pregnancy.  Pneumococcal 13-valent conjugate (PCV13) vaccine.** / Consult your health care provider.  Pneumococcal polysaccharide (PPSV23) vaccine.** / 1 to 2 doses if you smoke cigarettes or if you have certain conditions.  Meningococcal  1 dose if you are age 19 to 21 years and a first-year college  student living in a residence hall, or have one of several medical conditions, you need to get vaccinated against meningococcal disease. You may also need additional booster doses.  Hepatitis A vaccine.** / Consult your health care provider.  Hepatitis B vaccine.** / Consult your health care provider.  Haemophilus influenzae type b (Hib) vaccine.** / Consult your health care provider. Ages 40 to 64 years  Blood pressure check.** / Every 1 to 2 years.  Lipid and cholesterol check.** / Every 5 years beginning at age 20 years.  Lung cancer screening. / Every year if you are aged 55-80 years and have a 30-pack-year history of smoking and currently smoke or have quit within the past 15 years. Yearly screening is stopped once you have quit smoking for at least 15 years or develop a health problem that would prevent you from having lung cancer treatment.  Clinical breast exam.** / Every year after age 40 years.  BRCA-related cancer risk assessment.** / For women who have family members with a BRCA-related cancer (breast, ovarian, tubal, or peritoneal cancers).  Mammogram.** / Every year beginning at age 40 years and continuing for as long as you are in good health. Consult with your health care provider.  Pap test.** / Every 3 years starting at age 30 years through age 65 or 70 years with a history of 3 consecutive normal Pap tests.  HPV screening.** / Every 3 years from ages 30 years through ages 65 to 70 years with a history of 3 consecutive normal Pap tests.  Fecal occult blood test (FOBT) of stool. / Every year beginning at age 50 years and continuing until age 75 years. You may not need to do this test if you get a colonoscopy every 10 years.  Flexible sigmoidoscopy or colonoscopy.** / Every 5 years for a flexible sigmoidoscopy or every 10 years for a colonoscopy beginning at age 50 years and continuing until age 75 years.  Hepatitis C blood test.** / For all people born from 1944 through  1965 and any individual with known risks for hepatitis C.  Skin self-exam. / Monthly.  Influenza vaccine. / Every year.  Tetanus, diphtheria, and acellular pertussis (Tdap/Td) vaccine.** / Consult your health care provider. Pregnant women should receive 1 dose of Tdap vaccine during each pregnancy. 1 dose of Td every 10 years.  Varicella vaccine.** / Consult your health care provider. Pregnant females who do not have evidence of immunity should receive the first dose after pregnancy.  Zoster vaccine.** / 1 dose for adults aged 60 years or older.  Measles, mumps, rubella (MMR) vaccine.** / You need at least 1 dose of MMR if you were born in 1957 or later. You may also need a 2nd dose. For females of childbearing age, rubella immunity should be determined. If there is no evidence of immunity, females who are not pregnant should be vaccinated. If there is no evidence of immunity, females who are pregnant should delay immunization until after pregnancy.  Pneumococcal 13-valent conjugate (PCV13) vaccine.** / Consult your health care provider.  Pneumococcal polysaccharide (PPSV23) vaccine.** / 1 to 2 doses if you smoke cigarettes or if you have certain conditions.  Meningococcal vaccine.** / Consult your health care provider.  Hepatitis A vaccine.** / Consult your health care provider.  Hepatitis B vaccine.** / Consult your health care provider.  Haemophilus influenzae type b (Hib) vaccine.** / Consult your health care provider. Ages 65   Ages 7 years and over  Blood pressure check.** / Every 1 to 2 years.  Lipid and cholesterol check.** / Every 5 years beginning at age 60 years.  Lung cancer screening. / Every year if you are aged 19-80 years and have a 30-pack-year history of smoking and currently smoke or have quit within the past 15 years. Yearly screening is stopped once you have quit smoking for at least 15 years or develop a health problem that would prevent you from having lung cancer  treatment.  Clinical breast exam.** / Every year after age 58 years.  BRCA-related cancer risk assessment.** / For women who have family members with a BRCA-related cancer (breast, ovarian, tubal, or peritoneal cancers).  Mammogram.** / Every year beginning at age 62 years and continuing for as long as you are in good health. Consult with your health care provider.  Pap test.** / Every 3 years starting at age 12 years through age 75 or 40 years with 3 consecutive normal Pap tests. Testing can be stopped between 65 and 70 years with 3 consecutive normal Pap tests and no abnormal Pap or HPV tests in the past 10 years.  HPV screening.** / Every 3 years from ages 92 years through ages 18 or 56 years with a history of 3 consecutive normal Pap tests. Testing can be stopped between 65 and 70 years with 3 consecutive normal Pap tests and no abnormal Pap or HPV tests in the past 10 years.  Fecal occult blood test (FOBT) of stool. / Every year beginning at age 78 years and continuing until age 73 years. You may not need to do this test if you get a colonoscopy every 10 years.  Flexible sigmoidoscopy or colonoscopy.** / Every 5 years for a flexible sigmoidoscopy or every 10 years for a colonoscopy beginning at age 93 years and continuing until age 23 years.  Hepatitis C blood test.** / For all people born from [birth year] through 1964 and any individual with known risks for hepatitis C.  Osteoporosis screening.** / A one-time screening for women ages 18 years and over and women at risk for fractures or osteoporosis.  Skin self-exam. / Monthly.  Influenza vaccine. / Every year.  Tetanus, diphtheria, and acellular pertussis (Tdap/Td) vaccine.** / 1 dose of Td every 10 years.  Varicella vaccine.** / Consult your health care provider.  Zoster vaccine.** / 1 dose for adults aged 18 years or older.  Pneumococcal 13-valent conjugate (PCV13) vaccine.** / Consult your health care provider.  Pneumococcal  polysaccharide (PPSV23) vaccine.** / 1 dose for all adults aged 41 years and older.  Meningococcal vaccine.** / Consult your health care provider.  Hepatitis A vaccine.** / Consult your health care provider.  Hepatitis B vaccine.** / Consult your health care provider.  Haemophilus influenzae type b (Hib) vaccine.** / Consult your health care provider. ** Family history and personal history of risk and conditions may change your health care provider's recommendations. Document Released: 10/29/2001 Document Revised: 01/17/2014 Document Reviewed: 01/28/2011 [employer] Patient Information 2015 [place], [place]. This information is not intended to replace advice given to you by your health care provider. Make sure you discuss any questions you have with your health care provider.   You received your tdap vaccine today - this is good for 10 years  Try to increase the walking on your driveway to 30 minutes per day  Research the DASH diet for help with weight loss

## 2015-02-06 ENCOUNTER — Encounter: Payer: Self-pay | Admitting: Family

## 2015-02-06 ENCOUNTER — Ambulatory Visit (INDEPENDENT_AMBULATORY_CARE_PROVIDER_SITE_OTHER): Payer: Medicare Other | Admitting: Family

## 2015-02-06 VITALS — BP 131/94 | HR 94 | Temp 98.9°F | Ht 63.0 in | Wt 232.6 lb

## 2015-02-06 DIAGNOSIS — B351 Tinea unguium: Secondary | ICD-10-CM | POA: Diagnosis not present

## 2015-02-06 MED ORDER — TERBINAFINE HCL 250 MG PO TABS: 250.0000 mg | ORAL_TABLET | Freq: Every day | ORAL | Status: DC

## 2015-02-06 NOTE — Progress Notes (Signed)
   Subjective:    Patient ID: Ashlee Hoffman, female    DOB: 1981-08-01, 34 y.o.   MRN: [id number]  Toe Pain  The incident occurred more than 1 week ago. There was no injury mechanism. Pain location: Right great toe. The quality of the pain is described as aching. The pain is at a severity of 8/10. The pain is moderate. The pain has been fluctuating since onset. Pertinent negatives include no loss of motion, numbness or tingling. Nothing aggravates the symptoms. She has tried NSAIDs and acetaminophen for the symptoms. The treatment provided mild relief.      Review of Systems  Constitutional: Negative.   HENT: Negative.   Eyes: Negative.   Respiratory: Negative.  Negative for shortness of breath.   Cardiovascular: Negative.  Negative for palpitations.  Gastrointestinal: Negative.   Endocrine: Negative.   Genitourinary: Negative.   Musculoskeletal: Negative.   Neurological: Negative.  Negative for tingling, numbness and headaches.  Hematological: Negative.   Psychiatric/Behavioral: Negative.   All other systems reviewed and are negative.      Objective:   Physical Exam  Constitutional: She is oriented to person, place, and time. She appears well-developed and well-nourished. No distress.  HENT:  Head: Normocephalic and atraumatic.  Eyes: Pupils are equal, round, and reactive to light.  Neck: Normal range of motion. Neck supple. No thyromegaly present.  Cardiovascular: Normal rate, regular rhythm, normal heart sounds and intact distal pulses.   No murmur heard. Pulmonary/Chest: Effort normal and breath sounds normal. No respiratory distress. She has no wheezes.  Abdominal: Soft. Bowel sounds are normal. She exhibits no distension. There is no tenderness.  Musculoskeletal: Normal range of motion. She exhibits tenderness. She exhibits no edema.  Onychomycosis present on right great toe  Neurological: She is alert and oriented to person, place, and time. She has normal reflexes. No  cranial nerve deficit.  Skin: Skin is warm and dry.  Psychiatric: She has a normal mood and affect. Her behavior is normal. Judgment and thought content normal.  Vitals reviewed.   BP 131/94 mmHg  Pulse 94  Temp(Src) 98.9 F (37.2 C) (Oral)  Ht 5\' 3"  (1.6 m)  Wt 232 lb 9.6 oz (105.507 kg)  BMI 41.21 kg/m2       Assessment & Plan:  1. Onychomycosis of right great toe -Wash all shoes and socks -Clean toenail clippers before and after use -May need podiatry referral -RTO prn - terbinafine (LAMISIL) 250 MG tablet; Take 1 tablet (250 mg total) by mouth daily.  Dispense: 42 tablet; Refill: [place], FNP

## 2015-02-06 NOTE — Patient Instructions (Addendum)
Onychomycosis/Fungal Toenails  WHAT IS IT? An infection that lies within the keratin of your nail plate that is caused by a fungus.  WHY ME? Fungal infections affect all ages, sexes, races, and creeds.  There may be many factors that predispose you to a fungal infection such as age, coexisting medical conditions such as diabetes, or an autoimmune disease; stress, medications, fatigue, genetics, etc.  Bottom line: fungus thrives in a warm, moist environment and your shoes offer such a location.  IS IT CONTAGIOUS? Theoretically, yes.  You do not want to share shoes, nail clippers or files with someone who has fungal toenails.  Walking around barefoot in the same room or sleeping in the same bed is unlikely to transfer the organism.  It is important to realize, however, that fungus can spread easily from one nail to the next on the same foot.  HOW DO WE TREAT THIS?  There are several ways to treat this condition.  Treatment may depend on many factors such as age, medications, pregnancy, liver and kidney conditions, etc.  It is best to ask your doctor which options are available to you.  1. No treatment.   Unlike many other medical concerns, you can live with this condition.  However for many people this can be a painful condition and may lead to ingrown toenails or a bacterial infection.  It is recommended that you keep the nails cut short to help reduce the amount of fungal nail. 2. Topical treatment.  These range from herbal remedies to prescription strength nail lacquers.  About 40-50% effective, topicals require twice daily application for approximately 9 to 12 months or until an entirely new nail has grown out.  The most effective topicals are medical grade medications available through physicians offices. 3. Oral antifungal medications.  With an 80-90% cure rate, the most common oral medication requires 3 to 4 months of therapy and stays in your system for a year as the new nail grows out.  Oral  antifungal medications do require blood work to make sure it is a safe drug for you.  A liver function panel will be performed prior to starting the medication and after the first month of treatment.  It is important to have the blood work performed to avoid any harmful side effects.  In general, this medication safe but blood work is required. 4. Laser Therapy.  This treatment is performed by applying a specialized laser to the affected nail plate.  This therapy is noninvasive, fast, and non-painful.  It is not covered by insurance and is therefore, out of pocket.  The results have been very good with a 80-95% cure rate.  [facility] is the only practice in the area to offer this therapy. 5. Permanent Nail Avulsion.  Removing the entire nail so that a new nail will not grow back.   Terbinafine tablets What is this medicine? TERBINAFINE (TER bin a feen) is an antifungal medicine. It is used to treat certain kinds of fungal or yeast infections. This medicine may be used for other purposes; ask your health care provider or pharmacist if you have questions. COMMON BRAND NAME(S): Lamisil, Terbinex What should I tell my health care provider before I take this medicine? They need to know if you have any of these conditions: -drink alcoholic beverages -kidney disease -liver disease -an unusual or allergic reaction to terbinafine, other medicines, foods, dyes, or preservatives -pregnant or trying to get pregnant -breast-feeding How should I use this medicine?  Take this medicine by mouth with a full glass of water. Follow the directions on the prescription label. You can take this medicine with food or on an empty stomach. Take your medicine at regular intervals. Do not take your medicine more often than directed. Do not skip doses or stop your medicine early even if you feel better. Do not stop taking except on your doctor's advice. Talk to your pediatrician regarding the use of this medicine in  children. Special care may be needed. Overdosage: If you think you have taken too much of this medicine contact a poison control center or emergency room at once. NOTE: This medicine is only for you. Do not share this medicine with others. What if I miss a dose? If you miss a dose, take it as soon as you can. If it is almost time for your next dose, take only that dose. Do not take double or extra doses. What may interact with this medicine? Do not take this medicine with any of the following medications: -thioridazine This medicine may also interact with the following medications: -beta-blockers -caffeine -cimetidine -cyclosporine -medicines for depression, anxiety, or psychotic disturbances -medicines for fungal infections like fluconazole and ketoconazole -medicines for irregular heartbeat like amiodarone, flecainide and propafenone -rifampin -warfarin This list may not describe all possible interactions. Give your health care provider a list of all the medicines, herbs, non-prescription drugs, or dietary supplements you use. Also tell them if you smoke, drink alcohol, or use illegal drugs. Some items may interact with your medicine. What should I watch for while using this medicine? Visit your doctor or health care provider regularly. Tell your doctor right away if you have nausea or vomiting, loss of appetite, stomach pain on your right upper side, yellow skin, dark urine, light stools, or are over tired. Some fungal infections need many weeks or months of treatment to cure. If you are taking this medicine for a long time, you will need to have important blood work done. What side effects may I notice from receiving this medicine? Side effects that you should report to your doctor or health care professional as soon as possible: -allergic reactions like skin rash or hives, swelling of the face, lips, or tongue -changes in vision -dark urine -fever or infection -general ill feeling or  flu-like symptoms -light-colored stools -loss of appetite, nausea -redness, blistering, peeling or loosening of the skin, including inside the mouth -right upper belly pain -unusually weak or tired -yellowing of the eyes or skin Side effects that usually do not require medical attention (report to your doctor or health care professional if they continue or are bothersome): -changes in taste -diarrhea -hair loss -muscle or joint pain -stomach gas -stomach upset This list may not describe all possible side effects. Call your doctor for medical advice about side effects. You may report side effects to FDA at 1-800-FDA-1088. Where should I keep my medicine? Keep out of the reach of children. Store at room temperature below 25 degrees C (77 degrees F). Protect from light. Throw away any unused medicine after the expiration date. NOTE: This sheet is a summary. It may not cover all possible information. If you have questions about this medicine, talk to your doctor, pharmacist, or health care provider.  2015, Elsevier/Gold Standard. (2007-11-13 16:28:07) Preventing Toenail Fungus from Recurring   Sanitize your shoes with Mycomist spray or a similar shoe sanitizer spray.  Follow the instructions on the bottle and dry them outside in the sun or with  a hairdryer.  We also recommend repeating the sanitization once weekly in shoes you wear most often.   Throw away any shoes you have worn a significant amount without socks-fungus thrives in a warm moist environment and you want to avoid re-infection after your laser procedure   Bleach your socks with regular or color safe bleach   Change your socks regularly to keep your feet clean and dry (especially if you have sweaty feet)-if sweaty feet are a problem, let your doctor know-there is a great lotion that helps with this problem.   Clean your toenail clippers with alcohol before you use them if you do your own toenails and make sure to replace  [place] boards and orange sticks regularly   If you get regular pedicures, bring your own instruments or go to a spa that sterilizes their instruments in an autoclave.

## 2015-04-03 ENCOUNTER — Ambulatory Visit: Payer: Medicare Other | Admitting: Nurse Practitioner

## 2015-04-28 ENCOUNTER — Other Ambulatory Visit: Payer: Self-pay | Admitting: Family

## 2015-04-28 NOTE — Telephone Encounter (Signed)
Last seen 02/06/15  [facility]

## 2015-06-29 ENCOUNTER — Telehealth: Payer: Self-pay | Admitting: Nurse Practitioner

## 2015-06-29 NOTE — Telephone Encounter (Signed)
Stp's mother and scheduled pt with MMM 10/18 at 8:30.

## 2015-07-03 ENCOUNTER — Ambulatory Visit: Payer: Medicare Other | Admitting: Nurse Practitioner

## 2015-07-04 ENCOUNTER — Encounter: Payer: Self-pay | Admitting: Nurse Practitioner

## 2015-07-04 ENCOUNTER — Ambulatory Visit (INDEPENDENT_AMBULATORY_CARE_PROVIDER_SITE_OTHER): Payer: Medicare Other | Admitting: Nurse Practitioner

## 2015-07-04 VITALS — BP 135/97 | HR 76 | Temp 96.8°F | Ht 63.0 in | Wt 235.0 lb

## 2015-07-04 DIAGNOSIS — L84 Corns and callosities: Secondary | ICD-10-CM | POA: Insufficient documentation

## 2015-07-04 NOTE — Patient Instructions (Signed)
Corns and Calluses Corns are small areas of thickened skin that occur on the top, sides, or tip of a toe. They contain a cone-shaped core with a point that can press on a nerve below. This causes pain. Calluses are areas of thickened skin that can occur anywhere on the body including hands, fingers, palms, soles of the feet, and heels.Calluses are usually larger than corns.  CAUSES  Corns and calluses are caused by rubbing (friction) or pressure, such as from shoes that are too tight or do not fit properly.  RISK FACTORS Corns are more likely to develop in people who have toe deformities, such as hammer toes. Since calluses can occur with friction to any area of the skin, calluses are more likely to develop in people who:   Work with their hands.  Wear shoes that fit poorly, shoes that are too tight, or shoes that are high-heeled.  Have toes deformities. SYMPTOMS Symptoms of a corn or callus include:  A hard growth on the skin.   Pain or tenderness under the skin.   Redness and swelling.   Increased discomfort while wearing tight-fitting shoes. DIAGNOSIS  Corns and calluses may be diagnosed with a medical history and physical exam.  TREATMENT  Corns and calluses may be treated with:  Removing the cause of the friction or pressure. This may include:  Changing your shoes.  Wearing shoe inserts (orthotics) or other protective layers in your shoes, such as a corn pad.  Wearing gloves.  Medicines to help soften skin in the hardened, thickened areas.  Reducing the size of the corn or callus by removing the dead layers of skin.  Antibiotic medicines to treat infection.  Surgery, if a toe deformity is the cause. HOME CARE INSTRUCTIONS   Take medicines only as directed by your health care provider.  If you were prescribed an antibiotic, finish all of it even if you start to feel better.  Wear shoes that fit well. Avoid wearing high-heeled shoes and shoes that are too tight  or too loose.  Wear any padding, protective layers, gloves, or orthotics as directed by your health care provider.  Soak your hands or feet and then use a file or pumice stone to soften your corn or callus. Do this as directed by your health care provider.  Check your corn or callus every day for signs of infection. Watch for:  Redness, swelling, or pain.  Fluid, blood, or pus. SEEK MEDICAL CARE IF:   Your symptoms do not improve with treatment.  You have increased redness, swelling, or pain at the site of your corn or callus.  You have fluid, blood, or pus coming from your corn or callus.  You have new symptoms.   This information is not intended to replace advice given to you by your health care provider. Make sure you discuss any questions you have with your health care provider.   Document Released: 06/08/2004 Document Revised: 01/17/2015 Document Reviewed: 08/29/2014 Elsevier Interactive Patient Education 2016 Elsevier Inc.  

## 2015-07-04 NOTE — Progress Notes (Signed)
   Subjective:    Patient ID: Ashlee Hoffman, female    DOB: Oct 01, 1980, 34 y.o.   MRN: [id number]  HPI: C/o bilateral calloused areas to the medial side of her great toes for the past couple of months. Has tried to do pedicures but the areas remain painful and states "hurts to walk on them". Also has a fungus to the right great toenail.     Review of Systems  Respiratory: Negative.   Cardiovascular: Negative.   Skin: Negative for color change, rash and wound.       Objective:   Physical Exam  Constitutional: She appears well-developed and well-nourished.  Musculoskeletal: Normal range of motion.  Skin: Skin is warm and dry. No rash noted. No pallor.  Large calluses noted to bilateral great toes. Skin intact with not redness.         BP 135/97 mmHg  Pulse 76  Temp(Src) 96.8 F (36 C) (Oral)  Ht 5\' 3"  (1.6 m)  Wt 235 lb (106.595 kg)  BMI 41.64 kg/m2  Assessment & Plan:  1. Foot callus  - Ambulatory referral to Podiatry, Dr. Irving Shows  2. obesity Discussed low calorie diet with mom  encouraged exercise  Continue all meds Labs pending Health Maintenance reviewed Diet and exercise encouraged RTO  PRN  Mary-Margaret Hassell Done, FNP

## 2015-07-07 ENCOUNTER — Telehealth: Payer: Self-pay | Admitting: Nurse Practitioner

## 2015-08-01 DIAGNOSIS — M79676 Pain in unspecified toe(s): Secondary | ICD-10-CM | POA: Diagnosis not present

## 2015-08-01 DIAGNOSIS — B351 Tinea unguium: Secondary | ICD-10-CM | POA: Diagnosis not present

## 2015-10-16 ENCOUNTER — Encounter: Payer: Self-pay | Admitting: Family Medicine

## 2015-10-16 ENCOUNTER — Other Ambulatory Visit: Payer: Self-pay | Admitting: Family Medicine

## 2015-10-16 ENCOUNTER — Ambulatory Visit (INDEPENDENT_AMBULATORY_CARE_PROVIDER_SITE_OTHER): Payer: Medicare Other | Admitting: Family Medicine

## 2015-10-16 VITALS — BP 126/81 | HR 71 | Temp 97.7°F | Ht 63.0 in | Wt 243.2 lb

## 2015-10-16 DIAGNOSIS — J40 Bronchitis, not specified as acute or chronic: Secondary | ICD-10-CM | POA: Diagnosis not present

## 2015-10-16 DIAGNOSIS — J329 Chronic sinusitis, unspecified: Secondary | ICD-10-CM | POA: Diagnosis not present

## 2015-10-16 MED ORDER — AMOXICILLIN-POT CLAVULANATE 875-125 MG PO TABS: 1.0000 | ORAL_TABLET | Freq: Two times a day (BID) | ORAL | Status: DC

## 2015-10-16 NOTE — Patient Instructions (Addendum)
For weight: Consider a lean and green meal consisting of 4-6 ounces of a lean protein sources such as chicken and fish or sirloin combined with 31 cup servings of a green vegetable. This is the main meal of the day either at lunch or supper. The other 2 meals should be a lean protein shake. 1 snack daily with a 6 ounce cup of Greek yogurt is acceptable.  Finish all of the Augmentin for the sinobronchitis. Continue Mucinex D for decongestion. If symptoms don't clear over the next 3-4 days, let me know and I can send in a steroid Dosepak.

## 2015-10-16 NOTE — Progress Notes (Signed)
   Subjective:  Patient ID: Ashlee Hoffman, female    DOB: [birth year]  Age: 35 y.o. MRN: [id number]  CC: URI   HPI Chandal Lare presents for Patient presents with upper respiratory congestion. Rhinorrhea that is frequently purulent. There is moderate sore throat. Patient reports coughing frequently as well.-colored/purulent sputum noted. There is no fever no chills no sweats. The patient denies being short of breath. Onset was 3-5 days ago. Gradually worsening in spite of home remedies.   History Maryama has a past medical history of Shortness of breath and Mental disability.   She has past surgical history that includes No past surgeries and Lumbar laminectomy/decompression microdiscectomy (N/A, 05/24/2013).   Her family history includes Arthritis in her mother; Diabetes in her maternal grandfather; Heart disease in her maternal grandfather; Hypertension in her maternal grandfather and mother.She reports that she has never smoked. She does not have any smokeless tobacco history on file. She reports that she does not drink alcohol or use illicit drugs.  Current Outpatient Prescriptions on File Prior to Visit  Medication Sig Dispense Refill  . Acetaminophen (TYLENOL ARTHRITIS EXT RELIEF PO) Take 1-2 tablets by mouth as needed.    . medroxyPROGESTERone (PROVERA) 10 MG tablet Take 1 tablet (10 mg total) by mouth daily. 30 tablet 11  . meloxicam (MOBIC) 7.5 MG tablet Take 1 tablet (7.5 mg total) by mouth daily. (Patient not taking: Reported on 10/16/2015) 30 tablet 3  . terbinafine (LAMISIL) 250 MG tablet TAKE ONE TABLET BY MOUTH ONCE DAILY (Patient not taking: Reported on 10/16/2015) 42 tablet 1   No current facility-administered medications on file prior to visit.    ROS Review of Systems  Constitutional: Negative for fever, chills, activity change and appetite change.  HENT: Positive for congestion, postnasal drip, rhinorrhea and sinus pressure. Negative for ear discharge, ear pain, hearing loss,  nosebleeds, sneezing and trouble swallowing.   Respiratory: Negative for chest tightness and shortness of breath.   Cardiovascular: Negative for chest pain and palpitations.  Skin: Negative for rash.    Objective:  BP 126/81 mmHg  Pulse 71  Temp(Src) 97.7 F (36.5 C) (Oral)  Ht 5\' 3"  (1.6 m)  Wt 243 lb 3.2 oz (110.315 kg)  BMI 43.09 kg/m2  SpO2 98%  Physical Exam  Constitutional: She appears well-developed and well-nourished.  HENT:  Head: Normocephalic and atraumatic.  Right Ear: Tympanic membrane and external ear normal. No decreased hearing is noted.  Left Ear: Tympanic membrane and external ear normal. No decreased hearing is noted.  Nose: Mucosal edema present. Right sinus exhibits no frontal sinus tenderness. Left sinus exhibits no frontal sinus tenderness.  Mouth/Throat: No oropharyngeal exudate or posterior oropharyngeal erythema.  Neck: No Brudzinski's sign noted.  Pulmonary/Chest: Breath sounds normal. No respiratory distress.  Lymphadenopathy:       Head (right side): No preauricular adenopathy present.       Head (left side): No preauricular adenopathy present.       Right cervical: No superficial cervical adenopathy present.      Left cervical: No superficial cervical adenopathy present.    Assessment & Plan:   Lenorah was seen today for uri.  Diagnoses and all orders for this visit:  Sinobronchitis   I am having Ms. Counce maintain her Acetaminophen (TYLENOL ARTHRITIS EXT RELIEF PO), meloxicam, medroxyPROGESTERone, and terbinafine.  No orders of the defined types were placed in this encounter.     Follow-up: Return if symptoms worsen or fail to improve.  Claretta Fraise, M.D.

## 2015-10-30 ENCOUNTER — Other Ambulatory Visit: Payer: Self-pay | Admitting: Nurse Practitioner

## 2015-12-07 ENCOUNTER — Other Ambulatory Visit: Payer: Self-pay | Admitting: Nurse Practitioner

## 2016-01-12 ENCOUNTER — Encounter: Payer: Self-pay | Admitting: Family Medicine

## 2016-01-12 ENCOUNTER — Ambulatory Visit (INDEPENDENT_AMBULATORY_CARE_PROVIDER_SITE_OTHER): Payer: Medicare Other | Admitting: Family Medicine

## 2016-01-12 ENCOUNTER — Ambulatory Visit (INDEPENDENT_AMBULATORY_CARE_PROVIDER_SITE_OTHER): Payer: Medicare Other

## 2016-01-12 VITALS — BP 121/79 | HR 67 | Temp 97.4°F | Ht 63.0 in | Wt 240.4 lb

## 2016-01-12 DIAGNOSIS — M25572 Pain in left ankle and joints of left foot: Secondary | ICD-10-CM

## 2016-01-12 MED ORDER — MELOXICAM 15 MG PO TABS: 15.0000 mg | ORAL_TABLET | Freq: Every day | ORAL | Status: DC

## 2016-01-12 NOTE — Addendum Note (Signed)
Addended by: Marin Olp on: 01/12/2016 11:47 AM   Modules accepted: Orders

## 2016-01-12 NOTE — Patient Instructions (Signed)
Wear brace regularly for 1 month.

## 2016-01-12 NOTE — Progress Notes (Signed)
Subjective:  Patient ID: Ashlee Hoffman, female    DOB: 1980-09-22  Age: 35 y.o. MRN: [id number]  CC: Ankle Pain   HPI Ashlee Hoffman presents for fell 6 weeks ago climbing stairs. No eval done then. Now states pain has never resolved. Points to distal leg above ankle and just laterally toward lateral malleolus.   History Ashlee Hoffman has a past medical history of Shortness of breath and Mental disability.   She has past surgical history that includes No past surgeries and Lumbar laminectomy/decompression microdiscectomy (N/A, 05/24/2013).   Her family history includes Arthritis in her mother; Diabetes in her maternal grandfather; Heart disease in her maternal grandfather; Hypertension in her maternal grandfather and mother.She reports that she has never smoked. She does not have any smokeless tobacco history on file. She reports that she does not drink alcohol or use illicit drugs.    ROS Review of Systems  Constitutional: Negative for fever, activity change and appetite change.  HENT: Negative for congestion, rhinorrhea and sore throat.   Eyes: Negative for visual disturbance.  Respiratory: Negative for cough and shortness of breath.   Cardiovascular: Negative for chest pain and palpitations.  Gastrointestinal: Negative for nausea, abdominal pain and diarrhea.  Genitourinary: Negative for dysuria.  Musculoskeletal: Positive for joint swelling and arthralgias (see HPI). Negative for myalgias.    Objective:  BP 121/79 mmHg  Pulse 67  Temp(Src) 97.4 F (36.3 C) (Oral)  Ht 5\' 3"  (1.6 m)  Wt 240 lb 6.4 oz (109.045 kg)  BMI 42.60 kg/m2  SpO2 98%  BP Readings from Last 3 Encounters:  01/12/16 121/79  10/16/15 126/81  07/04/15 135/97    Wt Readings from Last 3 Encounters:  01/12/16 240 lb 6.4 oz (109.045 kg)  10/16/15 243 lb 3.2 oz (110.315 kg)  07/04/15 235 lb (106.595 kg)     Physical Exam  Constitutional: She is oriented to person, place, and time. She appears well-developed and  well-nourished.  HENT:  Head: Normocephalic.  Cardiovascular: Normal rate and regular rhythm.   No murmur heard. Pulmonary/Chest: Effort normal and breath sounds normal.  Musculoskeletal: She exhibits tenderness (at anterior left leg, just above ankle. No masses. Ankle stable for drawer, collaterals.).  Neurological: She is alert and oriented to person, place, and time.     Lab Results  Component Value Date   WBC 9.3 05/18/2013   HGB 12.5 05/18/2013   HCT 36.0 05/18/2013   PLT 267 05/18/2013   GLUCOSE 84 05/18/2013   CHOL 155 01/21/2013   TRIG 117 01/21/2013   HDL 35* 01/21/2013   LDLCALC 97 01/21/2013   ALT 28 01/21/2013   AST 15 01/21/2013   NA 136 05/18/2013   K 4.2 05/18/2013   CL 104 05/18/2013   CREATININE 0.65 05/18/2013   BUN 15 05/18/2013   CO2 21 05/18/2013   TSH 1.073 01/21/2013    No results found.  Assessment & Plan:   Ashlee Hoffman was seen today for ankle pain.  Diagnoses and all orders for this visit:  Left ankle pain -     DG Ankle Complete Left; Future -     Apply ASO ankle  Other orders -     meloxicam (MOBIC) 15 MG tablet; Take 1 tablet (15 mg total) by mouth daily.    ASO recommended. Wear for 1 month  I have discontinued Ms. Prophete's meloxicam, amoxicillin-clavulanate, and naproxen. I am also having her start on meloxicam. Additionally, I am having her maintain her Acetaminophen (TYLENOL ARTHRITIS EXT RELIEF PO),  terbinafine, and medroxyPROGESTERone.  Meds ordered this encounter  Medications  . meloxicam (MOBIC) 15 MG tablet    Sig: Take 1 tablet (15 mg total) by mouth daily.    Dispense:  30 tablet    Refill:  1     Follow-up: Return if symptoms worsen or fail to improve.  Claretta Fraise, M.D.

## 2016-03-19 ENCOUNTER — Other Ambulatory Visit: Payer: Self-pay | Admitting: Nurse Practitioner

## 2016-04-03 DIAGNOSIS — M5126 Other intervertebral disc displacement, lumbar region: Secondary | ICD-10-CM | POA: Diagnosis not present

## 2016-04-04 ENCOUNTER — Other Ambulatory Visit: Payer: Self-pay | Admitting: Neurosurgery

## 2016-04-04 DIAGNOSIS — M5126 Other intervertebral disc displacement, lumbar region: Secondary | ICD-10-CM

## 2016-04-11 ENCOUNTER — Other Ambulatory Visit: Payer: Medicare Other

## 2016-04-17 ENCOUNTER — Ambulatory Visit (HOSPITAL_COMMUNITY)
Admission: RE | Admit: 2016-04-17 | Discharge: 2016-04-17 | Disposition: A | Discharge status: Home / Self Care | Payer: Medicare Other | Source: Ambulatory Visit | Attending: Neurosurgery | Admitting: Neurosurgery

## 2016-04-17 DIAGNOSIS — M5126 Other intervertebral disc displacement, lumbar region: Secondary | ICD-10-CM | POA: Diagnosis not present

## 2016-04-17 DIAGNOSIS — G9619 Other disorders of meninges, not elsewhere classified: Secondary | ICD-10-CM | POA: Insufficient documentation

## 2016-04-17 DIAGNOSIS — R600 Localized edema: Secondary | ICD-10-CM | POA: Insufficient documentation

## 2016-04-17 MED ORDER — GADOBENATE DIMEGLUMINE 529 MG/ML IV SOLN
20.0000 mL | Freq: Once | INTRAVENOUS | Status: AC | PRN
  Administered 2016-04-17: 20 mL via INTRAVENOUS

## 2016-04-21 ENCOUNTER — Other Ambulatory Visit: Payer: Self-pay | Admitting: Nurse Practitioner

## 2016-04-22 ENCOUNTER — Other Ambulatory Visit: Payer: Self-pay | Admitting: Nurse Practitioner

## 2016-04-22 MED ORDER — MEDROXYPROGESTERONE ACETATE 10 MG PO TABS: 10.0000 mg | ORAL_TABLET | Freq: Every day | ORAL | 2 refills | Status: DC

## 2016-04-22 NOTE — Progress Notes (Signed)
Provera rx sent to pharmacy

## 2016-06-06 DIAGNOSIS — M5126 Other intervertebral disc displacement, lumbar region: Secondary | ICD-10-CM | POA: Diagnosis not present

## 2016-06-10 ENCOUNTER — Encounter: Payer: Self-pay | Admitting: Pharmacist

## 2016-06-10 ENCOUNTER — Ambulatory Visit (INDEPENDENT_AMBULATORY_CARE_PROVIDER_SITE_OTHER): Payer: Medicare Other | Admitting: Pharmacist

## 2016-06-10 VITALS — BP 112/70 | HR 80 | Ht 63.5 in | Wt 240.5 lb

## 2016-06-10 DIAGNOSIS — Z0000 Encounter for general adult medical examination without abnormal findings: Secondary | ICD-10-CM

## 2016-06-10 DIAGNOSIS — B351 Tinea unguium: Secondary | ICD-10-CM

## 2016-06-10 DIAGNOSIS — L84 Corns and callosities: Secondary | ICD-10-CM

## 2016-06-10 LAB — BMP8+EGFR
BUN/Creatinine Ratio: 16 (ref 9–23)
BUN: 13 mg/dL (ref 6–20)
CO2: 22 mmol/L (ref 18–29)
Calcium: 9.6 mg/dL (ref 8.7–10.2)
Chloride: 102 mmol/L (ref 96–106)
Creatinine, Ser: 0.83 mg/dL (ref 0.57–1.00)
GFR calc Af Amer: 106 mL/min/{1.73_m2} (ref >59)
GFR calc non Af Amer: 92 mL/min/{1.73_m2} (ref >59)
Glucose: 94 mg/dL (ref 65–99)
Potassium: 4.9 mmol/L (ref 3.5–5.2)
Sodium: 138 mmol/L (ref 134–144)

## 2016-06-10 NOTE — Patient Instructions (Addendum)
Ashlee Hoffman , Thank you for taking time to come for your Medicare Wellness Visit. I appreciate your ongoing commitment to your health goals. Please review the following plan we discussed and let me know if I can assist you in the future.   These are the goals we discussed:  Try to increase exercise - walk every day.  Goal is to walk to mailbox 2 times each day.   Download app my fitness pal - a good resource to keep track of calorie intake   Goal 1500  calories per day   Increase non-starchy vegetables - carrots, green bean, squash, zucchini, tomatoes, onions, peppers, spinach and other green leafy vegetables, cabbage, lettuce, cucumbers, asparagus, okra (not fried), eggplant Limit sugar and processed foods (cakes, cookies, ice cream, crackers and chips) Increase fresh fruit but limit serving sizes 1/2 cup or about the size of tennis or baseball Limit red meat to no more than 1-2 times per week (serving size about the size of your palm) Choose whole grains / lean proteins - whole wheat bread, quinoa, whole grain rice (1/2 cup), fish, chicken, [place] Avoid sugar and calorie containing beverages - soda, sweet tea and juice.  Choose water or unsweetened tea instead.     This is a list of the screening recommended for you and due dates:  Health Maintenance  Topic Date Due  . Pap Smear  02/22/2003  . Flu Shot  04/16/2016  . Tetanus Vaccine  12/19/2024   Health Maintenance, Female Adopting a healthy lifestyle and getting preventive care can go a long way to promote health and wellness. Talk with your health care provider about what schedule of regular examinations is right for you. This is a good chance for you to check in with your provider about disease prevention and staying healthy. In between checkups, there are plenty of things you can do on your own. Experts have done a lot of research about which lifestyle changes and preventive measures are most likely to keep you healthy. Ask your  health care provider for more information. WEIGHT AND DIET  Eat a healthy diet  Be sure to include plenty of vegetables, fruits, low-fat dairy products, and lean protein.  Do not eat a lot of foods high in solid fats, added sugars, or salt.  Get regular exercise. This is one of the most important things you can do for your health.  Most adults should exercise for at least 150 minutes each week. The exercise should increase your heart rate and make you sweat (moderate-intensity exercise).  Most adults should also do strengthening exercises at least twice a week. This is in addition to the moderate-intensity exercise.  Maintain a healthy weight  Body mass index (BMI) is a measurement that can be used to identify possible weight problems. It estimates body fat based on height and weight. Your health care provider can help determine your BMI and help you achieve or maintain a healthy weight.  For females 92 years of age and older:   A BMI below 18.5 is considered underweight.  A BMI of 18.5 to 24.9 is normal.  A BMI of 25 to 29.9 is considered overweight.  A BMI of 30 and above is considered obese.  Watch levels of cholesterol and blood lipids  You should start having your blood tested for lipids and cholesterol at 34 years of age, then have this test every 5 years.  You may need to have your cholesterol levels checked more often if:  Your  lipid or cholesterol levels are high.  You are older than 34 years of age.  You are at high risk for heart disease.  CANCER SCREENING   Lung Cancer  Lung cancer screening is recommended for adults 40-67 years old who are at high risk for lung cancer because of a history of smoking.  A yearly low-dose CT scan of the lungs is recommended for people who:  Currently smoke.  Have quit within the past 15 years.  Have at least a 30-pack-year history of smoking. A pack year is smoking an average of one pack of cigarettes a day for 1  year.  Yearly screening should continue until it has been 15 years since you quit.  Yearly screening should stop if you develop a health problem that would prevent you from having lung cancer treatment.  Breast Cancer  Practice breast self-awareness. This means understanding how your breasts normally appear and feel.  It also means doing regular breast self-exams. Let your health care provider know about any changes, no matter how small.  If you are in your 20s or 30s, you should have a clinical breast exam (CBE) by a health care provider every 1-3 years as part of a regular health exam.  If you are 53 or older, have a CBE every year. Also consider having a breast X-ray (mammogram) every year.  If you have a family history of breast cancer, talk to your health care provider about genetic screening.  If you are at high risk for breast cancer, talk to your health care provider about having an MRI and a mammogram every year.  Breast cancer gene (BRCA) assessment is recommended for women who have family members with BRCA-related cancers. BRCA-related cancers include:  Breast.  Ovarian.  Tubal.  Peritoneal cancers.  Results of the assessment will determine the need for genetic counseling and BRCA1 and BRCA2 testing. Cervical Cancer Your health care provider may recommend that you be screened regularly for cancer of the pelvic organs (ovaries, uterus, and vagina). This screening involves a pelvic examination, including checking for microscopic changes to the surface of your cervix (Pap test). You may be encouraged to have this screening done every 3 years, beginning at age 80.  For women ages 68-65, health care providers may recommend pelvic exams and Pap testing every 3 years, or they may recommend the Pap and pelvic exam, combined with testing for human papilloma virus (HPV), every 5 years. Some types of HPV increase your risk of cervical cancer. Testing for HPV may also be done on  women of any age with unclear Pap test results.  Other health care providers may not recommend any screening for nonpregnant women who are considered low risk for pelvic cancer and who do not have symptoms. Ask your health care provider if a screening pelvic exam is right for you.  If you have had past treatment for cervical cancer or a condition that could lead to cancer, you need Pap tests and screening for cancer for at least 20 years after your treatment. If Pap tests have been discontinued, your risk factors (such as having a new sexual partner) need to be reassessed to determine if screening should resume. Some women have medical problems that increase the chance of getting cervical cancer. In these cases, your health care provider may recommend more frequent screening and Pap tests. Colorectal Cancer  This type of cancer can be detected and often prevented.  Routine colorectal cancer screening usually begins at 34 years of  age and continues through 34 years of age.  Your health care provider may recommend screening at an earlier age if you have risk factors for colon cancer.  Your health care provider may also recommend using home test kits to check for hidden blood in the stool.  A small camera at the end of a tube can be used to examine your colon directly (sigmoidoscopy or colonoscopy). This is done to check for the earliest forms of colorectal cancer.  Routine screening usually begins at age 11.  Direct examination of the colon should be repeated every 5-10 years through 34 years of age. However, you may need to be screened more often if early forms of precancerous polyps or small growths are found. Skin Cancer  Check your skin from head to toe regularly.  Tell your health care provider about any new moles or changes in moles, especially if there is a change in a mole's shape or color.  Also tell your health care provider if you have a mole that is larger than the size of a  pencil eraser.  Always use sunscreen. Apply sunscreen liberally and repeatedly throughout the day.  Protect yourself by wearing long sleeves, pants, a wide-brimmed hat, and sunglasses whenever you are outside. HEART DISEASE, DIABETES, AND HIGH BLOOD PRESSURE   High blood pressure causes heart disease and increases the risk of stroke. High blood pressure is more likely to develop in:  People who have blood pressure in the high end of the normal range (130-139/85-89 mm Hg).  People who are overweight or obese.  People who are African American.  If you are 46-29 years of age, have your blood pressure checked every 3-5 years. If you are 51 years of age or older, have your blood pressure checked every year. You should have your blood pressure measured twice--once when you are at a hospital or clinic, and once when you are not at a hospital or clinic. Record the average of the two measurements. To check your blood pressure when you are not at a hospital or clinic, you can use:  An automated blood pressure machine at a pharmacy.  A home blood pressure monitor.  If you are between 60 years and 40 years old, ask your health care provider if you should take aspirin to prevent strokes.  Have regular diabetes screenings. This involves taking a blood sample to check your fasting blood sugar level.  If you are at a normal weight and have a low risk for diabetes, have this test once every three years after 34 years of age.  If you are overweight and have a high risk for diabetes, consider being tested at a younger age or more often. PREVENTING INFECTION  Hepatitis B  If you have a higher risk for hepatitis B, you should be screened for this virus. You are considered at high risk for hepatitis B if:  You were born in a country where hepatitis B is common. Ask your health care provider which countries are considered high risk.  Your parents were born in a high-risk country, and you have not been  immunized against hepatitis B (hepatitis B vaccine).  You have HIV or AIDS.  You use needles to inject street drugs.  You live with someone who has hepatitis B.  You have had sex with someone who has hepatitis B.  You get hemodialysis treatment.  You take certain medicines for conditions, including cancer, organ transplantation, and autoimmune conditions. Hepatitis C  Blood testing is  recommended for:  Everyone born from [birth year] through 1964.  Anyone with known risk factors for hepatitis C. Sexually transmitted infections (STIs)  You should be screened for sexually transmitted infections (STIs) including gonorrhea and chlamydia if:  You are sexually active and are younger than 34 years of age.  You are older than 34 years of age and your health care provider tells you that you are at risk for this type of infection.  Your sexual activity has changed since you were last screened and you are at an increased risk for chlamydia or gonorrhea. Ask your health care provider if you are at risk.  If you do not have HIV, but are at risk, it may be recommended that you take a prescription medicine daily to prevent HIV infection. This is called pre-exposure prophylaxis (PrEP). You are considered at risk if:  You are sexually active and do not regularly use condoms or know the HIV status of your partner(s).  You take drugs by injection.  You are sexually active with a partner who has HIV. Talk with your health care provider about whether you are at high risk of being infected with HIV. If you choose to begin PrEP, you should first be tested for HIV. You should then be tested every 3 months for as long as you are taking PrEP.  PREGNANCY   If you are premenopausal and you may become pregnant, ask your health care provider about preconception counseling.  If you may become pregnant, take 400 to 800 micrograms (mcg) of folic acid every day.  If you want to prevent pregnancy, talk to your  health care provider about birth control (contraception). OSTEOPOROSIS AND MENOPAUSE   Osteoporosis is a disease in which the bones lose minerals and strength with aging. This can result in serious bone fractures. Your risk for osteoporosis can be identified using a bone density scan.  If you are 38 years of age or older, or if you are at risk for osteoporosis and fractures, ask your health care provider if you should be screened.  Ask your health care provider whether you should take a calcium or vitamin D supplement to lower your risk for osteoporosis.  Menopause may have certain physical symptoms and risks.  Hormone replacement therapy may reduce some of these symptoms and risks. Talk to your health care provider about whether hormone replacement therapy is right for you.  HOME CARE INSTRUCTIONS   Schedule regular health, dental, and eye exams.  Stay current with your immunizations.   Do not use any tobacco products including cigarettes, chewing tobacco, or electronic cigarettes.  If you are pregnant, do not drink alcohol.  If you are breastfeeding, limit how much and how often you drink alcohol.  Limit alcohol intake to no more than 1 drink per day for nonpregnant women. One drink equals 12 ounces of beer, 5 ounces of wine, or 1 ounces of hard liquor.  Do not use street drugs.  Do not share needles.  Ask your health care provider for help if you need support or information about quitting drugs.  Tell your health care provider if you often feel depressed.  Tell your health care provider if you have ever been abused or do not feel safe at home.   This information is not intended to replace advice given to you by your health care provider. Make sure you discuss any questions you have with your health care provider.   Document Released: 03/18/2011 Document Revised: 09/23/2014 Document Reviewed: 08/04/2013  Chartered certified accountant Patient Education [employer].

## 2016-06-10 NOTE — Progress Notes (Signed)
Subjective:   Ashlee Hoffman is a 35 y.o. female who presents for a subsequent Medicare Annual Wellness Visit.    She is accompanied by her mother for the visit today.  Patient has some mental disabilities and lives with her mother and step father. She is not currently working or volunteering outside of the home but her mother has tried getting her various jobs in the past.  The fact that mom works irregular hours and in [place] which is 30 minutes from home makes this a challenge.   Ashlee Hoffman is extremely friendly and out going.  She is not very active but 1 or 2 times per week she walk to get mail / their driveway is very long.   Ashlee Hoffman and mother voice two main concerns.  One is that Ashlee Hoffman has completed course of antifungal for toenail fungus but she continues to have discomfort in her right great toe and it appears to still be affected by fungus.  They are not happy with their current podiatrist and would like to switch.  They are also concerned about Ashlee Hoffman's carpel tunnel / wrist pain.  She wear splints but pain / numbness has minimally been impacted.   Objective:    Today's Vitals   06/10/16 1233  BP: 112/70  Pulse: 80  Weight: 240 lb 8 oz (109.1 kg)  Height: 5' 3.5" (1.613 m)  PainSc: 2   PainLoc: Back    Current Medications (verified) Outpatient Encounter Prescriptions as of 06/10/2016  Medication Sig  . Acetaminophen (TYLENOL ARTHRITIS EXT RELIEF PO) Take 1-2 tablets by mouth as needed. Reported on 01/12/2016  . medroxyPROGESTERone (PROVERA) 10 MG tablet Take 1 tablet (10 mg total) by mouth daily.  . meloxicam (MOBIC) 15 MG tablet Take 1 tablet (15 mg total) by mouth daily.  . [DISCONTINUED] terbinafine (LAMISIL) 250 MG tablet TAKE ONE TABLET BY MOUTH ONCE DAILY (Patient not taking: Reported on 06/10/2016)   No facility-administered encounter medications on file as of 06/10/2016.     Allergies (verified) Septra [sulfamethoxazole-trimethoprim]   History: Past Medical History:    Diagnosis Date  . 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 Mental disability   . Shortness of breath    with exertion   Past Surgical History:  Procedure Laterality Date  . LUMBAR LAMINECTOMY/DECOMPRESSION MICRODISCECTOMY N/A 05/24/2013   Procedure: LUMBAR LAMINECTOMY/DECOMPRESSION MICRODISCECTOMY 1 LEVEL LEFT LUMBAR FIVE-SACARAL-ONE;  Surgeon: Charlie Pitter, MD;  Location: [facility];  Service: Neurosurgery;  Laterality: N/A;  left   Family History  Problem Relation Age of Onset  . Hypertension Mother   . Arthritis Mother   . Diabetes Maternal Grandfather   . Hypertension Maternal Grandfather   . Heart disease Maternal Grandfather      Activities of Daily Living In your present state of health, do you have any difficulty performing the following activities: 06/10/2016  Hearing? N  Vision? N  Difficulty concentrating or making decisions? Y  Walking or climbing stairs? N  Dressing or bathing? N  Doing errands, shopping? Y  Preparing Food and eating ? N  Using the Toilet? N  In the past six months, have you accidently leaked urine? N  Do you have problems with loss of bowel control? N  Managing your Medications? Y  Managing your Finances? Y  Some recent data might be hidden   MMSE - Mini Mental State Exam 06/10/2016 12/20/2014  Not completed: Unable to complete Refused  Orientation to time - 4  Orientation to Place - 3  Registration - 3  Attention/ Calculation - 0  Recall - 1  Language- name 2 objects - 2  Language- repeat - 1  Language- follow 3 step command - 1  Language- read & follow direction - 1  Write a sentence - 1  Copy design - 0  Total score - 17   PHQ 2/9 Scores 06/10/2016 01/12/2016 10/16/2015 12/20/2014 11/28/2014 12/13/2013  PHQ - 2 Score 0 0 0 0 0 0   Fall Risk  06/10/2016 12/20/2014 11/28/2014 12/13/2013  Falls in the past year? No No No No   Immunizations and Health Maintenance Immunization History  Administered Date(s) Administered  . Tdap 12/20/2014   Health  Maintenance Due  Topic Date Due  . HIV Screening  02/21/1997  . PAP SMEAR  02/22/2003  . INFLUENZA VACCINE  04/16/2016   Current Exercise Habits: Home exercise routine, Type of exercise: walking, Time (Minutes): 10, Frequency (Times/Week): 2, Weekly Exercise (Minutes/Week): 20, Intensity: Mild  Patient Care Team: Chevis Pretty, FNP as PCP - General (Nurse Practitioner) Earnie Larsson, MD as Consulting Physician (Neurosurgery)     Assessment:   This is a routine wellness examination for Ashlee Hoffman.   Screening Tests Health Maintenance  Topic Date Due  . HIV Screening  02/21/1997  . PAP SMEAR  02/22/2003  . INFLUENZA VACCINE  04/16/2016  . TETANUS/TDAP  12/19/2024         Plan:     Try to increase exercise to 30 minutes of walking most days of the week. Patient's mother to investigate DASH diet to help with weight loss.  During the course of the visit, Ashlee Hoffman was educated and counseled about the following appropriate screening and preventive services:   Vaccines - patient declined influenza vaccine  Nutrition counseling - discussed decreasing portions sizes and healthier foods choices.  Also recommended cut calories to 1500 calories per day and download my fitness pal to help keep track of calories  Exercise - encouraged to increase exercise - walk to mailbox and back 2 times per day.   Referral made to podiatrist  Discussed carpel tunnel - mom is planning to see ortho this week and will ask about Dr Amedeo Plenty seeing patient.   PAP - Last pap negative 07/23/10.  Patient is not sexually active.  Will get in next 2-3 months.   Orders Placed This Encounter  Procedures  . BMP8+EGFR  . Ambulatory referral to Podiatry    Referral Priority:   Routine    Referral Type:   Consultation    Referral Reason:   Specialty Services Required    Referred to Provider:   Bronson Ing, DPM    Requested Specialty:   Podiatry    Number of Visits Requested:   1     Patient Instructions  (the written plan) were given to the patient.    Cherre Robins, PharmD   06/10/2016     Patient ID: Ashlee Hoffman, female   DOB: 1981-09-15, 35 y.o.   MRN: [id number]

## 2016-06-24 ENCOUNTER — Ambulatory Visit (INDEPENDENT_AMBULATORY_CARE_PROVIDER_SITE_OTHER): Payer: Medicare Other | Admitting: Podiatry

## 2016-06-24 ENCOUNTER — Encounter: Payer: Self-pay | Admitting: Podiatry

## 2016-06-24 DIAGNOSIS — M79674 Pain in right toe(s): Secondary | ICD-10-CM

## 2016-06-24 DIAGNOSIS — B351 Tinea unguium: Secondary | ICD-10-CM

## 2016-06-24 DIAGNOSIS — M79675 Pain in left toe(s): Secondary | ICD-10-CM

## 2016-06-24 DIAGNOSIS — M2140 Flat foot [pes planus] (acquired), unspecified foot: Secondary | ICD-10-CM | POA: Diagnosis not present

## 2016-06-24 DIAGNOSIS — L84 Corns and callosities: Secondary | ICD-10-CM | POA: Diagnosis not present

## 2016-06-24 NOTE — Progress Notes (Signed)
   Subjective:    Patient ID: Ashlee Hoffman, female    DOB: [birth year], 35 y.o.   MRN: [id number]  HPI  35 year old female presents the office today with her mom for concerns of her right big toenail becoming thick and discolored and painful particular pressure in shoe gear. She was previous sound Lamisil last year without any relief. She also states her left big toenail becomes ingrown the tip of the toe causes pain. Other toenails also for painful thick elongated and causing ingrown toenails. She denies any redness or drainage or any swelling from around the toenails. She has gets calluses to both of her big toes.   Review of Systems  All other systems reviewed and are negative.      Objective:   Physical Exam General: AAO x3, NAD  Dermatological: Right hallux toe nails hypertrophic, dystrophic, discolored and there is tenderness palpation. There is also incurvation of both the medial and lateral nail borders of the left hallux toenail. The remainder the toenails are also somewhat dystrophic and discolored and elongated with incurvation the nails. There is tenderness nails 1-5 bilaterally. The right hallux toenails loosen the underlying nail bed and only appeared on the very proximal nail border. This been redness or drainage. Hyperkeratotic lesions bilateral medial hallux. No underlying ulceration, drainage or signs of infection. No other open lesions or pre-ulcerative lesions are identified.  Vascular: Dorsalis Pedis artery and Posterior Tibial artery pedal pulses are 2/4 bilateral with immedate capillary fill time. Pedal hair growth present. There is no pain with calf compression, swelling, warmth, erythema.   Neruologic: Grossly intact via light touch bilateral. Vibratory intact via tuning fork bilateral. Protective threshold with Semmes Wienstein monofilament intact to all pedal sites bilateral.   Musculoskeletal: Decreased medial arch height upon weightbearing. Equinus is present. No other  areas of tenderness bilaterally  Gait: Unassisted, Nonantalgic.      Assessment & Plan:  35 year old female with symptomatic onychomycosis, ingrown toenails, onychodystrophy, calluses -Treatment options discussed including all alternatives, risks, and complications -Etiology of symptoms were discussed -Nails debrided 10 without complications or bleeding. The right hallux toenail is debrided to remove all loose toenail. -Calluses debrided x 2 without complications or bleeding. Offloading pads dispensed -Recommended orthotics- she will start with OTC inserts.  -Follow-up in 3 months or sooner if any problems arise. In the meantime, encouraged to call the office with any questions, concerns, change in symptoms.   Celesta Gentile, DPM

## 2016-07-24 DIAGNOSIS — M79641 Pain in right hand: Secondary | ICD-10-CM | POA: Diagnosis not present

## 2016-08-09 ENCOUNTER — Other Ambulatory Visit: Payer: Self-pay | Admitting: Nurse Practitioner

## 2016-09-10 ENCOUNTER — Other Ambulatory Visit: Payer: Self-pay | Admitting: Nurse Practitioner

## 2016-09-10 NOTE — Telephone Encounter (Signed)
Last seen 01/12/16

## 2016-09-10 NOTE — Telephone Encounter (Signed)
Okay to refill for patient? 

## 2016-09-11 MED ORDER — MEDROXYPROGESTERONE ACETATE 10 MG PO TABS: 10.0000 mg | ORAL_TABLET | Freq: Every day | ORAL | 0 refills | Status: DC

## 2016-09-11 NOTE — Telephone Encounter (Signed)
Patient's mother Ashlee Hoffman) aware that medication has been sent to the [facility] in [place].   Per MMM

## 2016-09-23 ENCOUNTER — Ambulatory Visit: Payer: Medicare Other | Admitting: Podiatry

## 2016-09-30 ENCOUNTER — Ambulatory Visit (INDEPENDENT_AMBULATORY_CARE_PROVIDER_SITE_OTHER): Payer: Medicare Other | Admitting: Podiatry

## 2016-09-30 ENCOUNTER — Encounter: Payer: Self-pay | Admitting: Podiatry

## 2016-09-30 DIAGNOSIS — M79675 Pain in left toe(s): Secondary | ICD-10-CM | POA: Diagnosis not present

## 2016-09-30 DIAGNOSIS — B351 Tinea unguium: Secondary | ICD-10-CM | POA: Diagnosis not present

## 2016-09-30 DIAGNOSIS — M79674 Pain in right toe(s): Secondary | ICD-10-CM | POA: Diagnosis not present

## 2016-10-01 MED ORDER — NONFORMULARY OR COMPOUNDED ITEM: 2 refills | Status: DC

## 2016-10-01 NOTE — Addendum Note (Signed)
Addended by: Harriett Sine D on: 10/01/2016 05:54 PM   Modules accepted: Orders

## 2016-10-01 NOTE — Progress Notes (Signed)
Subjective: 35 y.o. returns the office today for painful, elongated, thickened toenails which she cannot trim herself. Denies any redness or drainage around the nails. Her right big toe is the worst. She has not been applying any medication and she has been soaking in epsom salts. Denies any acute changes since last appointment and no new complaints today. Denies any systemic complaints such as fevers, chills, nausea, vomiting.   Objective: AAO 3, NAD DP/PT pulses palpable, CRT less than 3 seconds  Nails hypertrophic, dystrophic, elongated, brittle, discolored 10. There is tenderness overlying the nails 1-5 bilaterally. There is no surrounding erythema or drainage along the nail sites. No open lesions or pre-ulcerative lesions are identified. No other areas of tenderness bilateral lower extremities. No overlying edema, erythema, increased warmth. No pain with calf compression, swelling, warmth, erythema.  Assessment: Patient presents with symptomatic onychomycosis  Plan: -Treatment options including alternatives, risks, complications were discussed -Nails sharply debrided 10 without complication/bleeding. -Discussed further treatment options for onychomycosis and I ordered a compound cream today through [facility].  -Discussed daily foot inspection. If there are any changes, to call the office immediately.  -Follow-up in 3 months or sooner if any problems are to arise. In the meantime, encouraged to call the office with any questions, concerns, changes symptoms.  Theseus Birnie, DPM  

## 2016-10-13 ENCOUNTER — Other Ambulatory Visit: Payer: Self-pay | Admitting: Nurse Practitioner

## 2016-10-14 MED ORDER — MEDROXYPROGESTERONE ACETATE 10 MG PO TABS: 10.0000 mg | ORAL_TABLET | Freq: Every day | ORAL | 0 refills | Status: DC

## 2016-10-15 ENCOUNTER — Ambulatory Visit (INDEPENDENT_AMBULATORY_CARE_PROVIDER_SITE_OTHER): Payer: Medicare Other

## 2016-10-15 ENCOUNTER — Encounter: Payer: Self-pay | Admitting: Family Medicine

## 2016-10-15 ENCOUNTER — Ambulatory Visit (INDEPENDENT_AMBULATORY_CARE_PROVIDER_SITE_OTHER): Payer: Medicare Other | Admitting: Family Medicine

## 2016-10-15 ENCOUNTER — Other Ambulatory Visit: Payer: Self-pay | Admitting: Family Medicine

## 2016-10-15 VITALS — BP 122/83 | HR 82 | Temp 98.5°F | Ht 63.5 in | Wt 244.0 lb

## 2016-10-15 DIAGNOSIS — M79641 Pain in right hand: Secondary | ICD-10-CM

## 2016-10-15 NOTE — Progress Notes (Signed)
Subjective:  Patient ID: Ashlee Hoffman, female    DOB: June 10, 1981  Age: 36 y.o. MRN: [id number]  CC: Wrist Pain (pt here today c/o right wrist pain)   HPI Ashlee Hoffman presents for Moderately severe pain in the right hand primarily at the second and third fingers. Worse with flexing. Flexes at the wrist causes pain as well. Denies numbness or tingling. Mom states this is been going on for many years. She's been wearing braces on it for about 8 years for tendinitis. They saw an orthopedic hand surgeon last year who just said keep wearing the braces but didn't pursue testing. Mom says he didn't offer much. She would like a second opinion. Unfortunately due to Ashlee Hoffman's underlying mental disability, she is not very reliable regarding the character of the pain. She denies numbness, she says the pain is 8 out of 10. She does reliably pointed out as being at the right second and third fingers. It radiates from the ventral wrist. 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   She has a past surgical history that includes Lumbar laminectomy/decompression microdiscectomy (N/A, 05/24/2013).   Her family history includes Arthritis in her mother; Diabetes in her maternal grandfather; Heart disease in her maternal grandfather; Hypertension in her maternal grandfather and mother.She reports that she has never smoked. She has never used smokeless tobacco. She reports that she does not drink alcohol or use drugs.    ROS Review of Systems  Constitutional: Negative for activity change and fever.  HENT: Negative for congestion and sore throat.   Respiratory: Negative.   Cardiovascular: Negative.   Gastrointestinal: Negative.   Musculoskeletal:       See history of present illness. Otherwise negative  Neurological: Negative for speech difficulty and weakness.  Psychiatric/Behavioral: Negative.      Objective:  BP 122/83   Pulse 82   Temp 98.5 F (36.9 C) (Oral)   Ht 5' 3.5" (1.613 m)   Wt 244 lb (110.7 kg)   BMI 42.54 kg/m   BP Readings from Last 3 Encounters:  10/15/16 122/83  06/10/16 112/70  01/12/16 121/79    Wt Readings from Last 3 Encounters:  10/15/16 244 lb (110.7 kg)  06/10/16 240 lb 8 oz (109.1 kg)  01/12/16 240 lb 6.4 oz (109 kg)     Physical Exam  Constitutional: She is oriented to person, place, and time. She appears well-developed and well-nourished. No distress.  HENT:  Head: Normocephalic.  Eyes: Conjunctivae and EOM are normal.  Neck: Normal range of motion. Neck supple.  Cardiovascular: Normal rate and regular rhythm.   Pulmonary/Chest: Effort normal and breath sounds normal.  Abdominal: Soft.  Musculoskeletal: Normal range of motion. She exhibits tenderness (base of right 2&3 fingers palmar side. and at ventral wrist). She exhibits no edema.  Neurological: She is alert and oriented to person, place, and time.  Skin: Skin is warm and dry.  Psychiatric: She has a normal mood and affect. Her behavior is normal.      Assessment & Plan:   Ashlee Hoffman was seen today for wrist pain.  Diagnoses and all orders for this visit:  Hand pain, right -     DG Hand Complete Right; Future -     Nerve conduction test; Future   XR - nml appearing R hand  I have discontinued Ms. Esquer's NONFORMULARY OR COMPOUNDED ITEM. I am also having her  maintain her Acetaminophen (TYLENOL ARTHRITIS EXT RELIEF PO), meloxicam, and medroxyPROGESTERone.  Allergies as of 10/15/2016      Reactions   Septra [sulfamethoxazole-trimethoprim] Itching      Medication List       Accurate as of 10/15/16  3:10 PM. Always use your most recent med list.          medroxyPROGESTERone 10 MG tablet Commonly known as:  PROVERA Take 1 tablet (10 mg total) by mouth daily.   meloxicam 15 MG tablet Commonly known as:  MOBIC Take 1 tablet (15 mg total) by mouth daily.   TYLENOL  ARTHRITIS EXT RELIEF PO Take 1-2 tablets by mouth as needed. Reported on 01/12/2016        Follow-up: No Follow-up on file.  Ashlee Hoffman, M.D.

## 2016-11-12 ENCOUNTER — Other Ambulatory Visit: Payer: Self-pay | Admitting: Nurse Practitioner

## 2016-11-13 ENCOUNTER — Ambulatory Visit (INDEPENDENT_AMBULATORY_CARE_PROVIDER_SITE_OTHER): Payer: Medicare Other | Admitting: Family Medicine

## 2016-11-13 ENCOUNTER — Encounter: Payer: Self-pay | Admitting: Family Medicine

## 2016-11-13 ENCOUNTER — Other Ambulatory Visit: Payer: Self-pay | Admitting: Nurse Practitioner

## 2016-11-13 VITALS — BP 142/102 | HR 88 | Temp 97.2°F | Ht 63.5 in | Wt 242.0 lb

## 2016-11-13 DIAGNOSIS — M79641 Pain in right hand: Secondary | ICD-10-CM | POA: Diagnosis not present

## 2016-11-13 MED ORDER — MEDROXYPROGESTERONE ACETATE 10 MG PO TABS: 10.0000 mg | ORAL_TABLET | Freq: Every day | ORAL | 0 refills | Status: DC

## 2016-11-13 MED ORDER — MEDROXYPROGESTERONE ACETATE 10 MG PO TABS: 10.0000 mg | ORAL_TABLET | Freq: Every day | ORAL | 5 refills | Status: DC

## 2016-11-13 NOTE — Progress Notes (Signed)
Subjective:  Patient ID: Ashlee Hoffman, female    DOB: 1981/01/13  Age: 36 y.o. MRN: [id number]  CC: Wrist Pain (pt here today c/o right wrist pain. She states the pain hasn't really gotten any better.)   HPI Ashlee Hoffman presents for Moderately severe pain in the right hand primarily at the second and third fingers. Worse with flexing. Flexes at the wrist causes pain as well. Denies numbness or tingling. Mom states this is been going on for many years. She's been wearing braces on it for about 8 years for tendinitis. They saw an orthopedic hand surgeon last year who just said keep wearing the braces but didn't pursue testing. Mom says he didn't offer much. She would like a second opinion. Unfortunately due to Ashlee Hoffman's underlying mental disability, she is not very reliable regarding the character of the pain. She denies numbness, she says the pain is 8 out of 10. She does reliably pointed out as being at the right second and third fingers. It radiates from the ventral wrist. 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   She has a past surgical history that includes Lumbar laminectomy/decompression microdiscectomy (N/A, 05/24/2013).   Ashlee Hoffman family history includes Arthritis in Ashlee Hoffman mother; Diabetes in Ashlee Hoffman maternal grandfather; Heart disease in Ashlee Hoffman maternal grandfather; Hypertension in Ashlee Hoffman maternal grandfather and mother.She reports that she has never smoked. She has never used smokeless tobacco. She reports that she does not drink alcohol or use drugs.    ROS Review of Systems  Constitutional: Negative for activity change and fever.  HENT: Negative for congestion and sore throat.   Respiratory: Negative.   Cardiovascular: Negative.   Gastrointestinal: Negative.   Musculoskeletal:       See history of present illness. Otherwise negative  Neurological: Negative for speech difficulty and  weakness.  Psychiatric/Behavioral: Negative.     Objective:  BP (!) 142/102   Pulse 88   Temp 97.2 F (36.2 C) (Oral)   Ht 5' 3.5" (1.613 m)   Wt 242 lb (109.8 kg)   BMI 42.20 kg/m   BP Readings from Last 3 Encounters:  11/13/16 (!) 142/102  10/15/16 122/83  06/10/16 112/70    Wt Readings from Last 3 Encounters:  11/13/16 242 lb (109.8 kg)  10/15/16 244 lb (110.7 kg)  06/10/16 240 lb 8 oz (109.1 kg)     Physical Exam  Constitutional: She is oriented to person, place, and time. She appears well-developed and well-nourished. No distress.  HENT:  Head: Normocephalic.  Eyes: Conjunctivae and EOM are normal.  Neck: Normal range of motion. Neck supple.  Cardiovascular: Normal rate and regular rhythm.   Pulmonary/Chest: Effort normal and breath sounds normal.  Abdominal: Soft.  Musculoskeletal: Normal range of motion. She exhibits tenderness (base of right thumb and thenar immenence). She exhibits no edema.  Neurological: She is alert and oriented to person, place, and time.  Neg. Tinel & Phalen test, R wrist   Skin: Skin is warm and dry.  Psychiatric: She has a normal mood and affect. Ashlee Hoffman behavior is normal.      Assessment & Plan:   Ashlee Hoffman was seen today for wrist pain.  Diagnoses and all orders for this visit:  Hand pain, right -     Nerve conduction test; Future -     Ambulatory referral to Orthopedics   I am having Ashlee Hoffman maintain Ashlee Hoffman Acetaminophen (  TYLENOL ARTHRITIS EXT RELIEF PO), meloxicam, and medroxyPROGESTERone.  Allergies as of 11/13/2016      Reactions   Septra [sulfamethoxazole-trimethoprim] Itching      Medication List       Accurate as of 11/13/16 11:59 PM. Always use your most recent med list.          medroxyPROGESTERone 10 MG tablet Commonly known as:  PROVERA Take 1 tablet (10 mg total) by mouth daily.   meloxicam 15 MG tablet Commonly known as:  MOBIC TAKE ONE TABLET BY MOUTH ONCE DAILY   TYLENOL ARTHRITIS EXT RELIEF  PO Take 1-2 tablets by mouth as needed. Reported on 01/12/2016        Follow-up: Return if symptoms worsen or fail to improve.  Claretta Fraise, M.D.

## 2016-11-13 NOTE — Telephone Encounter (Signed)
Refill sent to pharmacy.   

## 2016-12-09 DIAGNOSIS — M79641 Pain in right hand: Secondary | ICD-10-CM | POA: Diagnosis not present

## 2016-12-17 ENCOUNTER — Other Ambulatory Visit: Payer: Self-pay | Admitting: Nurse Practitioner

## 2016-12-19 MED ORDER — MEDROXYPROGESTERONE ACETATE 10 MG PO TABS: 10.0000 mg | ORAL_TABLET | Freq: Every day | ORAL | 0 refills | Status: DC

## 2016-12-20 ENCOUNTER — Other Ambulatory Visit: Payer: Self-pay | Admitting: Nurse Practitioner

## 2016-12-23 DIAGNOSIS — M79641 Pain in right hand: Secondary | ICD-10-CM | POA: Diagnosis not present

## 2016-12-30 ENCOUNTER — Ambulatory Visit (INDEPENDENT_AMBULATORY_CARE_PROVIDER_SITE_OTHER): Payer: Medicare Other | Admitting: Podiatry

## 2016-12-30 DIAGNOSIS — M79674 Pain in right toe(s): Secondary | ICD-10-CM

## 2016-12-30 DIAGNOSIS — B351 Tinea unguium: Secondary | ICD-10-CM | POA: Diagnosis not present

## 2016-12-30 DIAGNOSIS — M79641 Pain in right hand: Secondary | ICD-10-CM | POA: Diagnosis not present

## 2016-12-30 DIAGNOSIS — M79675 Pain in left toe(s): Secondary | ICD-10-CM

## 2016-12-31 NOTE — Progress Notes (Signed)
Subjective: 36 y.o. returns the office today for painful, elongated, thickened toenails which she cannot trim herself. Denies any redness or drainage around the nails. Her right big toe is the worst. She has not been applying any medication and she has been soaking in epsom salts. Denies any acute changes since last appointment and no new complaints today. Denies any systemic complaints such as fevers, chills, nausea, vomiting.   Objective: AAO 3, NAD DP/PT pulses palpable, CRT less than 3 seconds  Nails hypertrophic, dystrophic, elongated, brittle, discolored 10. There is tenderness overlying the nails 1-5 bilaterally. There is no surrounding erythema or drainage along the nail sites. No open lesions or pre-ulcerative lesions are identified. No other areas of tenderness bilateral lower extremities. No overlying edema, erythema, increased warmth. No pain with calf compression, swelling, warmth, erythema.  Assessment: Patient presents with symptomatic onychomycosis  Plan: -Treatment options including alternatives, risks, complications were discussed -Nails sharply debrided 10 without complication/bleeding. -Discussed further treatment options for onychomycosis and I ordered a compound cream today through [facility].  -Discussed daily foot inspection. If there are any changes, to call the office immediately.  -Follow-up in 3 months or sooner if any problems are to arise. In the meantime, encouraged to call the office with any questions, concerns, changes symptoms.  Celesta Gentile, DPM

## 2017-01-03 ENCOUNTER — Other Ambulatory Visit: Payer: Self-pay | Admitting: Nurse Practitioner

## 2017-01-03 MED ORDER — MEDROXYPROGESTERONE ACETATE 10 MG PO TABS: 10.0000 mg | ORAL_TABLET | Freq: Every day | ORAL | 2 refills | Status: DC

## 2017-01-10 DIAGNOSIS — M79641 Pain in right hand: Secondary | ICD-10-CM | POA: Diagnosis not present

## 2017-01-20 DIAGNOSIS — M25431 Effusion, right wrist: Secondary | ICD-10-CM | POA: Diagnosis not present

## 2017-01-20 DIAGNOSIS — M79641 Pain in right hand: Secondary | ICD-10-CM | POA: Diagnosis not present

## 2017-02-17 DIAGNOSIS — M25431 Effusion, right wrist: Secondary | ICD-10-CM | POA: Diagnosis not present

## 2017-02-17 DIAGNOSIS — M79641 Pain in right hand: Secondary | ICD-10-CM | POA: Diagnosis not present

## 2017-03-08 ENCOUNTER — Other Ambulatory Visit: Payer: Self-pay | Admitting: Nurse Practitioner

## 2017-03-10 ENCOUNTER — Encounter: Payer: Self-pay | Admitting: Nurse Practitioner

## 2017-03-10 MED ORDER — MEDROXYPROGESTERONE ACETATE 10 MG PO TABS: 10.0000 mg | ORAL_TABLET | Freq: Every day | ORAL | 2 refills | Status: DC

## 2017-03-31 ENCOUNTER — Encounter: Payer: Self-pay | Admitting: Podiatry

## 2017-03-31 ENCOUNTER — Ambulatory Visit (INDEPENDENT_AMBULATORY_CARE_PROVIDER_SITE_OTHER): Payer: Medicare Other | Admitting: Podiatry

## 2017-03-31 DIAGNOSIS — M79675 Pain in left toe(s): Secondary | ICD-10-CM

## 2017-03-31 DIAGNOSIS — B351 Tinea unguium: Secondary | ICD-10-CM

## 2017-03-31 DIAGNOSIS — M79674 Pain in right toe(s): Secondary | ICD-10-CM | POA: Diagnosis not present

## 2017-03-31 DIAGNOSIS — M25431 Effusion, right wrist: Secondary | ICD-10-CM | POA: Diagnosis not present

## 2017-04-01 NOTE — Progress Notes (Signed)
Subjective: 36 y.o. returns the office today for painful, elongated, thickened toenails which she cannot trim herself. Denies any redness or drainage around the nails.  Denies any acute changes since last appointment and no new complaints today. Denies any systemic complaints such as fevers, chills, nausea, vomiting.   Objective: AAO 3, NAD DP/PT pulses palpable, CRT less than 3 seconds  Nails hypertrophic, dystrophic, elongated, brittle, discolored 10. There is tenderness overlying the nails 1-5 bilaterally. There is no surrounding erythema or drainage along the nail sites. No open lesions or pre-ulcerative lesions are identified. No other areas of tenderness bilateral lower extremities. No overlying edema, erythema, increased warmth. No pain with calf compression, swelling, warmth, erythema.  Assessment: Patient presents with symptomatic onychomycosis  Plan: -Treatment options including alternatives, risks, complications were discussed -Nails sharply debrided 10 without complication/bleeding. -Discussed daily foot inspection. If there are any changes, to call the office immediately.  -Follow-up in 3 months or sooner if any problems are to arise. In the meantime, encouraged to call the office with any questions, concerns, changes symptoms.  Celesta Gentile, DPM

## 2017-06-30 ENCOUNTER — Ambulatory Visit: Payer: Medicare Other | Admitting: Podiatry

## 2017-08-09 ENCOUNTER — Other Ambulatory Visit: Payer: Self-pay | Admitting: Nurse Practitioner

## 2017-08-11 ENCOUNTER — Other Ambulatory Visit: Payer: Self-pay | Admitting: Family Medicine

## 2017-08-12 ENCOUNTER — Other Ambulatory Visit: Payer: Self-pay | Admitting: Nurse Practitioner

## 2017-08-12 MED ORDER — MEDROXYPROGESTERONE ACETATE 10 MG PO TABS: 10.0000 mg | ORAL_TABLET | Freq: Every day | ORAL | 5 refills | Status: DC

## 2017-08-12 NOTE — Telephone Encounter (Signed)
Pt aware refill sent to [facility] she will be out of town for about 10d to 2 wks will call back to make appt

## 2017-09-25 IMAGING — CR DG ANKLE COMPLETE 3+V*L*
3 series · 4 of 4 positions shown · non-contrast
Comparison: None.

CLINICAL DATA: Left ankle pain after fall 6 weeks prior.

EXAM:
LEFT ANKLE COMPLETE - 3+ VIEW

[view not recorded (1 of 3)]
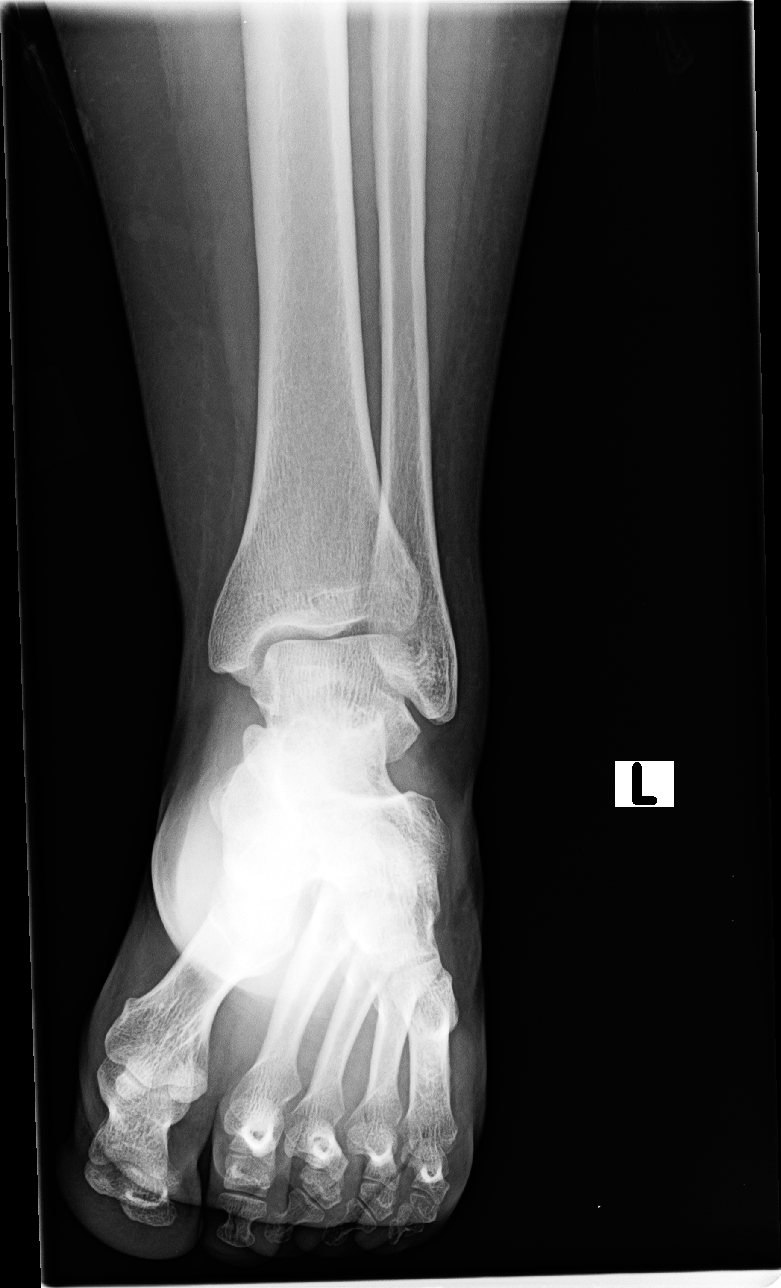

[view not recorded (2 of 3)]
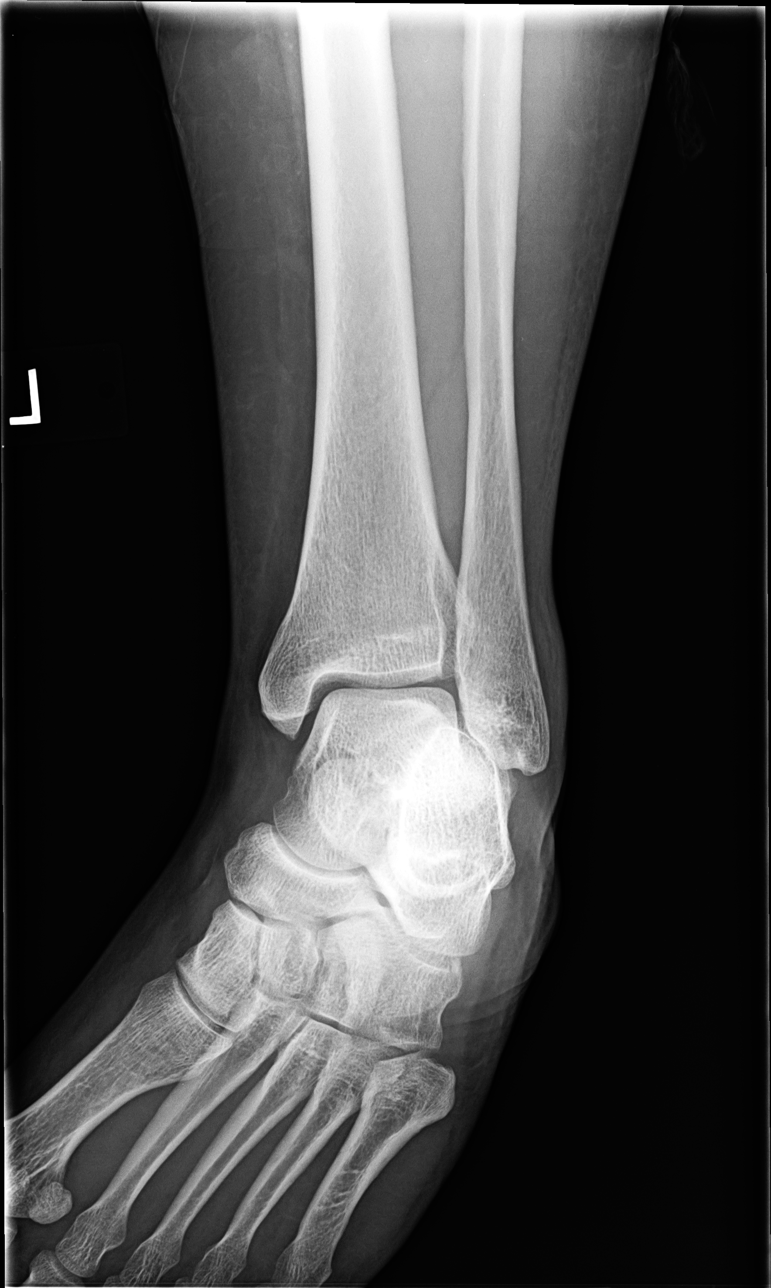

[Series 1003: view not recorded · 0.10mm/px · 2 of 2 slices shown (3 of 3)]
[im 1/2]
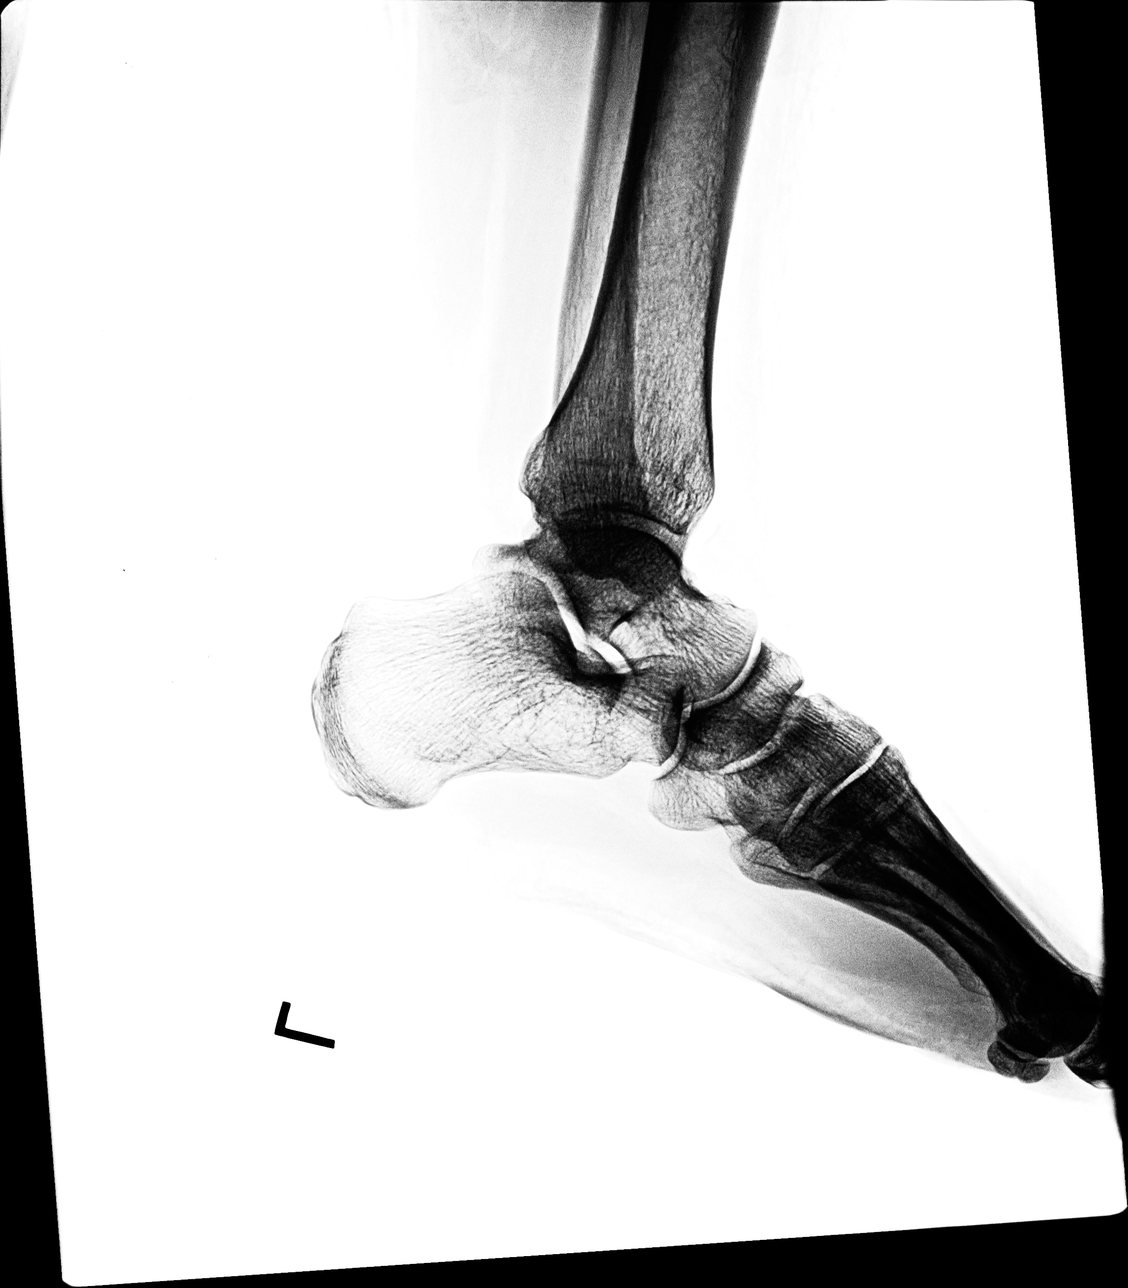
[im 2/2]
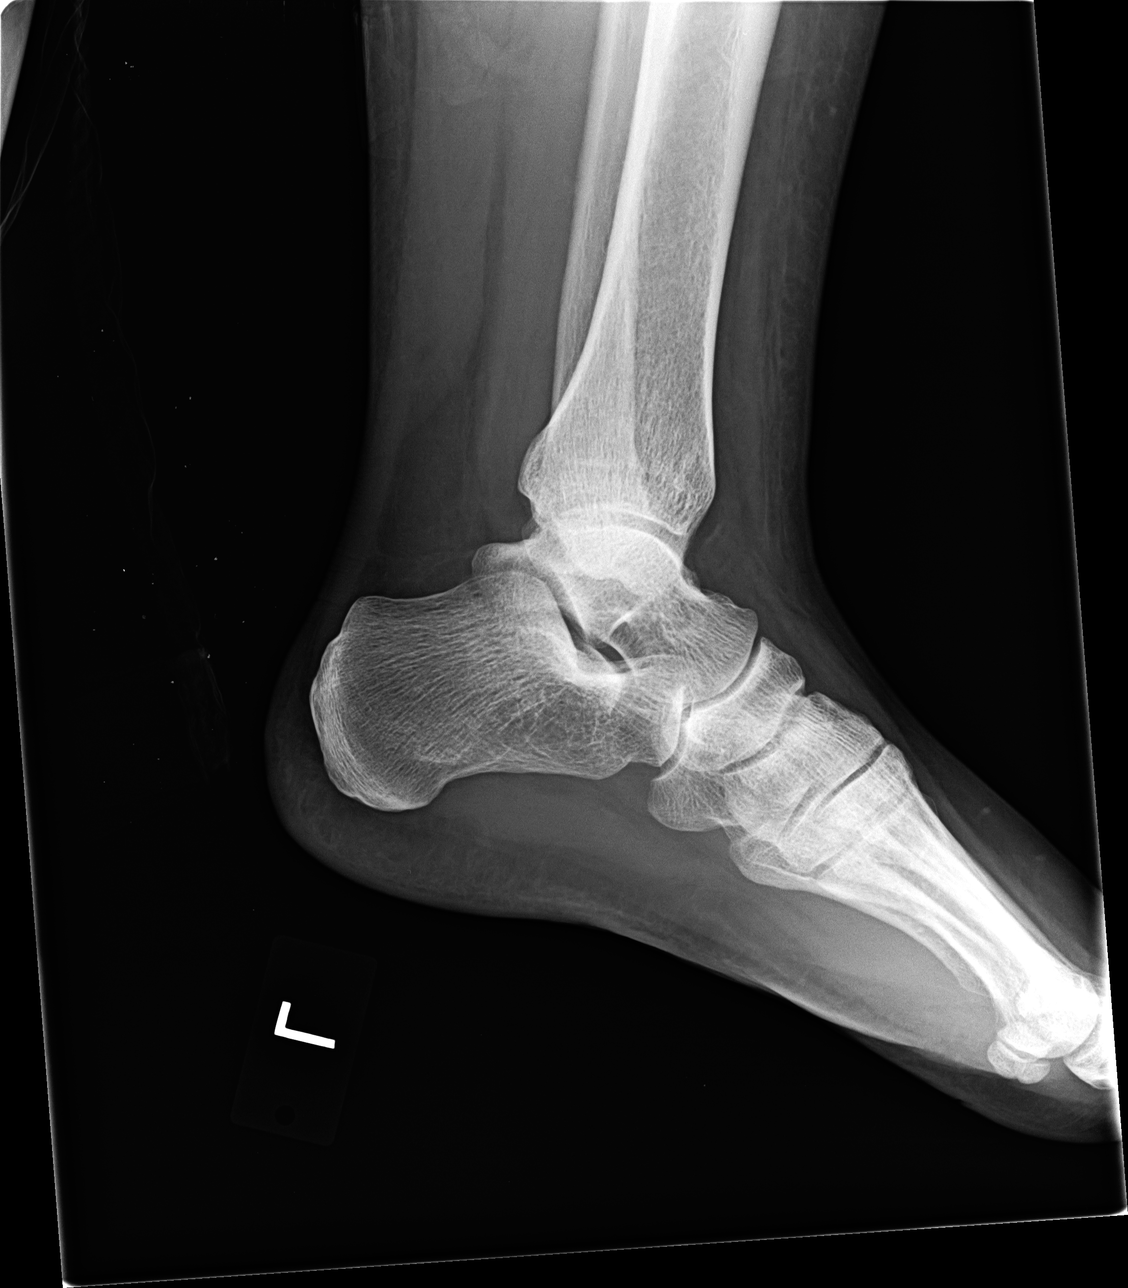

[4 of 4 positions shown; findings below may reference images not displayed]

FINDINGS: There is no evidence of fracture, dislocation, or joint effusion. No
acute or healing fracture. There is no evidence of arthropathy or
other focal bone abnormality. Soft tissues are unremarkable.
IMPRESSION: Negative radiographs of the left ankle.

## 2017-10-23 DIAGNOSIS — Z6841 Body Mass Index (BMI) 40.0 and over, adult: Secondary | ICD-10-CM | POA: Diagnosis not present

## 2017-10-23 DIAGNOSIS — M5126 Other intervertebral disc displacement, lumbar region: Secondary | ICD-10-CM | POA: Diagnosis not present

## 2017-10-25 ENCOUNTER — Other Ambulatory Visit: Payer: Self-pay | Admitting: Neurosurgery

## 2017-10-25 DIAGNOSIS — M5126 Other intervertebral disc displacement, lumbar region: Secondary | ICD-10-CM

## 2017-10-27 ENCOUNTER — Ambulatory Visit
Admission: RE | Admit: 2017-10-27 | Discharge: 2017-10-27 | Disposition: A | Discharge status: Home / Self Care | Payer: Medicare Other | Source: Ambulatory Visit | Attending: Neurosurgery | Admitting: Neurosurgery

## 2017-10-27 DIAGNOSIS — M5126 Other intervertebral disc displacement, lumbar region: Secondary | ICD-10-CM | POA: Diagnosis not present

## 2017-10-27 MED ORDER — GADOBENATE DIMEGLUMINE 529 MG/ML IV SOLN
20.0000 mL | Freq: Once | INTRAVENOUS | Status: AC | PRN
Start: 2017-10-27 — End: 2017-10-27
  Administered 2017-10-27: 20 mL via INTRAVENOUS

## 2017-11-05 DIAGNOSIS — M5126 Other intervertebral disc displacement, lumbar region: Secondary | ICD-10-CM | POA: Diagnosis not present

## 2017-11-11 ENCOUNTER — Other Ambulatory Visit: Payer: Self-pay | Admitting: Nurse Practitioner

## 2017-11-13 ENCOUNTER — Other Ambulatory Visit: Payer: Self-pay | Admitting: Nurse Practitioner

## 2017-11-13 ENCOUNTER — Telehealth: Payer: Self-pay | Admitting: Nurse Practitioner

## 2017-11-14 ENCOUNTER — Other Ambulatory Visit: Payer: Self-pay

## 2017-11-14 MED ORDER — MEDROXYPROGESTERONE ACETATE 10 MG PO TABS: 10.0000 mg | ORAL_TABLET | Freq: Every day | ORAL | 0 refills | Status: DC

## 2017-11-14 NOTE — Telephone Encounter (Signed)
This was refused per protocol a few days ago. Do you want to fill or do they ntbs? Please advise

## 2017-11-14 NOTE — Telephone Encounter (Signed)
provera refilled by nurse today. Patient  was told to make appointment

## 2017-11-14 NOTE — Telephone Encounter (Signed)
Spoke with mother. Refilled rx for 30 days and appt was made for follow up in 2 weeks.

## 2017-12-02 ENCOUNTER — Ambulatory Visit (INDEPENDENT_AMBULATORY_CARE_PROVIDER_SITE_OTHER): Payer: Medicare Other | Admitting: Nurse Practitioner

## 2017-12-02 ENCOUNTER — Encounter: Payer: Self-pay | Admitting: Nurse Practitioner

## 2017-12-02 VITALS — BP 116/78 | HR 83 | Temp 97.2°F | Ht 63.0 in | Wt 248.0 lb

## 2017-12-02 DIAGNOSIS — F79 Unspecified intellectual disabilities: Secondary | ICD-10-CM | POA: Diagnosis not present

## 2017-12-02 MED ORDER — MEDROXYPROGESTERONE ACETATE 10 MG PO TABS: 10.0000 mg | ORAL_TABLET | Freq: Every day | ORAL | 11 refills | Status: DC

## 2017-12-02 NOTE — Patient Instructions (Signed)
Exercising to Lose Weight Exercising can help you to lose weight. In order to lose weight through exercise, you need to do vigorous-intensity exercise. You can tell that you are exercising with vigorous intensity if you are breathing very hard and fast and cannot hold a conversation while exercising. Moderate-intensity exercise helps to maintain your current weight. You can tell that you are exercising at a moderate level if you have a higher heart rate and faster breathing, but you are still able to hold a conversation. How often should I exercise? Choose an activity that you enjoy and set realistic goals. Your health care provider can help you to make an activity plan that works for you. Exercise regularly as directed by your health care provider. This may include:  Doing resistance training twice each week, such as: ? Push-ups. ? Sit-ups. ? Lifting weights. ? Using resistance bands.  Doing a given intensity of exercise for a given amount of time. Choose from these options: ? 150 minutes of moderate-intensity exercise every week. ? 75 minutes of vigorous-intensity exercise every week. ? A mix of moderate-intensity and vigorous-intensity exercise every week.  Children, pregnant women, people who are out of shape, people who are overweight, and older adults may need to consult a health care provider for individual recommendations. If you have any sort of medical condition, be sure to consult your health care provider before starting a new exercise program. What are some activities that can help me to lose weight?  Walking at a rate of at least 4.5 miles an hour.  Jogging or running at a rate of 5 miles per hour.  Biking at a rate of at least 10 miles per hour.  Lap swimming.  Roller-skating or in-line skating.  Cross-country skiing.  Vigorous competitive sports, such as football, basketball, and soccer.  Jumping rope.  Aerobic dancing. How can I be more active in my day-to-day  activities?  Use the stairs instead of the elevator.  Take a walk during your lunch break.  If you drive, park your car farther away from work or school.  If you take public transportation, get off one stop early and walk the rest of the way.  Make all of your phone calls while standing up and walking around.  Get up, stretch, and walk around every 30 minutes throughout the day. What guidelines should I follow while exercising?  Do not exercise so much that you hurt yourself, feel dizzy, or get very short of breath.  Consult your health care provider prior to starting a new exercise program.  Wear comfortable clothes and shoes with good support.  Drink plenty of water while you exercise to prevent dehydration or heat stroke. Body water is lost during exercise and must be replaced.  Work out until you breathe faster and your heart beats faster. This information is not intended to replace advice given to you by your health care provider. Make sure you discuss any questions you have with your health care provider. Document Released: 10/05/2010 Document Revised: 02/08/2016 Document Reviewed: 02/03/2014 Elsevier Interactive Patient Education  2018 [employer] Inc.  

## 2017-12-02 NOTE — Addendum Note (Signed)
Addended by: Chevis Pretty on: 12/02/2017 03:16 PM   Modules accepted: Orders

## 2017-12-02 NOTE — Progress Notes (Signed)
   Subjective:    Patient ID: Ashlee Hoffman, female    DOB: Aug 27, 1981, 37 y.o.   MRN: [id number]  HPI  Daleigh Pollinger is here today for follow up of chronic medical problem.  Outpatient Encounter Medications as of 12/02/2017  Medication Sig  . Acetaminophen (TYLENOL ARTHRITIS EXT RELIEF PO) Take 1-2 tablets by mouth as needed. Reported on 01/12/2016  . medroxyPROGESTERone (PROVERA) 10 MG tablet Take 1 tablet (10 mg total) by mouth daily.  . meloxicam (MOBIC) 15 MG tablet TAKE ONE TABLET BY MOUTH ONCE DAILY    1. Mentally disabled  Lives with her mom and step dad. 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  2. Morbid obesity ([place])  Weight is up 6 lbs from last visit    New complaints: She has been having some back pain- was told by DR. Trenton Gammon that pain was from not exercising enough. She had back surgery 5 years ago and that area was still good.       Review of Systems  Constitutional: Negative.   HENT: Negative.   Respiratory: Negative.   Cardiovascular: Negative.   Gastrointestinal: Negative.   Musculoskeletal: Positive for back pain.  Neurological: Negative.   Psychiatric/Behavioral: Negative.   All other systems reviewed and are negative.      Objective:   Physical Exam  Constitutional: She is oriented to person, place, and time. She appears well-developed and well-nourished.  HENT:  Nose: Nose normal.  Mouth/Throat: Oropharynx is clear and moist.  Eyes: EOM are normal.  Neck: Trachea normal, normal range of motion and full passive range of motion without pain. Neck supple. No JVD present. Carotid bruit is not present. No thyromegaly present.  Cardiovascular: Normal rate, regular rhythm, normal heart sounds and intact distal pulses. Exam reveals no gallop and no friction rub.  No murmur heard. Pulmonary/Chest: Effort normal and breath sounds normal.  Abdominal: Soft. Bowel sounds are normal. She exhibits no distension and no mass. There is no  tenderness.  Musculoskeletal: Normal range of motion.  Lymphadenopathy:    She has no cervical adenopathy.  Neurological: She is alert and oriented to person, place, and time. She has normal reflexes.  Skin: Skin is warm and dry.  Psychiatric: She has a normal mood and affect. Her behavior is normal. Judgment and thought content normal.   BP 116/78   Pulse 83   Temp (!) 97.2 F (36.2 C) (Oral)   Ht '5\' 3"'$  (1.6 m)   Wt 248 lb (112.5 kg)   BMI 43.93 kg/m       Assessment & Plan:  1. Mentally disabled  2. Morbid obesity ([place]) - CMP14+EGFR - Lipid panel - Thyroid Panel With TSH    Labs pending Health maintenance reviewed Diet and exercise encouraged Continue all meds Follow up  In 6 month   [place], FNP

## 2017-12-30 ENCOUNTER — Other Ambulatory Visit: Payer: Medicare Other

## 2017-12-30 DIAGNOSIS — Z136 Encounter for screening for cardiovascular disorders: Secondary | ICD-10-CM | POA: Diagnosis not present

## 2017-12-30 DIAGNOSIS — R635 Abnormal weight gain: Secondary | ICD-10-CM | POA: Diagnosis not present

## 2017-12-31 LAB — CMP14+EGFR
ALT: 38 IU/L — ABNORMAL HIGH (ref 0–32)
AST: 23 IU/L (ref 0–40)
Albumin/Globulin Ratio: 1.6 (ref 1.2–2.2)
Albumin: 4.4 g/dL (ref 3.5–5.5)
Alkaline Phosphatase: 57 IU/L (ref 39–117)
BUN/Creatinine Ratio: 13 (ref 9–23)
BUN: 12 mg/dL (ref 6–20)
Bilirubin Total: 0.2 mg/dL (ref 0.0–1.2)
CO2: 21 mmol/L (ref 20–29)
Calcium: 9.6 mg/dL (ref 8.7–10.2)
Chloride: 104 mmol/L (ref 96–106)
Creatinine, Ser: 0.91 mg/dL (ref 0.57–1.00)
GFR calc Af Amer: 95 mL/min/{1.73_m2} (ref >59)
GFR calc non Af Amer: 82 mL/min/{1.73_m2} (ref >59)
Globulin, Total: 2.7 g/dL (ref 1.5–4.5)
Glucose: 88 mg/dL (ref 65–99)
Potassium: 4.5 mmol/L (ref 3.5–5.2)
Sodium: 141 mmol/L (ref 134–144)
Total Protein: 7.1 g/dL (ref 6.0–8.5)

## 2017-12-31 LAB — LIPID PANEL
Chol/HDL Ratio: 4.9 ratio — ABNORMAL HIGH (ref 0.0–4.4)
Cholesterol, Total: 186 mg/dL (ref 100–199)
HDL: 38 mg/dL — ABNORMAL LOW (ref >39)
LDL Calculated: 122 mg/dL — ABNORMAL HIGH (ref 0–99)
Triglycerides: 129 mg/dL (ref 0–149)
VLDL Cholesterol Cal: 26 mg/dL (ref 5–40)

## 2017-12-31 LAB — THYROID PANEL WITH TSH
Free Thyroxine Index: 1.3 (ref 1.2–4.9)
T3 Uptake Ratio: 22 % — ABNORMAL LOW (ref 24–39)
T4, Total: 5.9 ug/dL (ref 4.5–12.0)
TSH: 1.44 u[IU]/mL (ref 0.450–4.500)

## 2018-04-09 DIAGNOSIS — Z6841 Body Mass Index (BMI) 40.0 and over, adult: Secondary | ICD-10-CM | POA: Diagnosis not present

## 2018-04-30 ENCOUNTER — Encounter: Payer: Self-pay | Admitting: Podiatry

## 2018-04-30 ENCOUNTER — Ambulatory Visit (INDEPENDENT_AMBULATORY_CARE_PROVIDER_SITE_OTHER): Payer: Medicare Other | Admitting: Podiatry

## 2018-04-30 DIAGNOSIS — B351 Tinea unguium: Secondary | ICD-10-CM

## 2018-04-30 DIAGNOSIS — M79675 Pain in left toe(s): Secondary | ICD-10-CM

## 2018-04-30 DIAGNOSIS — M79674 Pain in right toe(s): Secondary | ICD-10-CM

## 2018-05-01 DIAGNOSIS — M25431 Effusion, right wrist: Secondary | ICD-10-CM | POA: Diagnosis not present

## 2018-05-01 DIAGNOSIS — M25439 Effusion, unspecified wrist: Secondary | ICD-10-CM | POA: Insufficient documentation

## 2018-05-04 NOTE — Progress Notes (Signed)
Subjective: 37 y.o. returns the office today for painful, elongated, thickened toenails which she cannot trim herself. Denies any redness or drainage around the nails.  Denies any acute changes since last appointment and no new complaints today. Denies any systemic complaints such as fevers, chills, nausea, vomiting.   Objective: AAO 3, NAD DP/PT pulses palpable, CRT less than 3 seconds  Nails hypertrophic, dystrophic, elongated, brittle, discolored 10. There is tenderness overlying the nails 1-5 bilaterally. There is no surrounding erythema or drainage along the nail sites. Minimal hyperkeratotic tissue is present bilateral submetatarsal area.  Upon debridement there is no underlying ulceration, drainage or any signs of infection. No open lesions or pre-ulcerative lesions are identified. No other areas of tenderness bilateral lower extremities. No overlying edema, erythema, increased warmth. No pain with calf compression, swelling, warmth, erythema.  Assessment: Patient presents with symptomatic onychomycosis  Plan: -Treatment options including alternatives, risks, complications were discussed -Nails sharply debrided 10 without complication/bleeding. -Minimal hyperkeratotic tissue was present today and I debrided this today as a courtesy.  -Discussed daily foot inspection. If there are any changes, to call the office immediately.  -Follow-up in 3 months or sooner if any problems are to arise. In the meantime, encouraged to call the office with any questions, concerns, changes symptoms.  Celesta Gentile, DPM

## 2018-06-01 DIAGNOSIS — M25431 Effusion, right wrist: Secondary | ICD-10-CM | POA: Diagnosis not present

## 2018-07-10 ENCOUNTER — Other Ambulatory Visit: Payer: Self-pay | Admitting: Nurse Practitioner

## 2018-07-10 DIAGNOSIS — F79 Unspecified intellectual disabilities: Secondary | ICD-10-CM

## 2018-07-13 NOTE — Telephone Encounter (Signed)
Last seen 12/02/17  MMM   Do not know when she had her last PAP

## 2018-12-05 ENCOUNTER — Other Ambulatory Visit: Payer: Self-pay | Admitting: Nurse Practitioner

## 2018-12-05 DIAGNOSIS — F79 Unspecified intellectual disabilities: Secondary | ICD-10-CM

## 2019-01-19 ENCOUNTER — Other Ambulatory Visit: Payer: Self-pay

## 2019-01-19 DIAGNOSIS — F79 Unspecified intellectual disabilities: Secondary | ICD-10-CM

## 2019-01-19 MED ORDER — MEDROXYPROGESTERONE ACETATE 10 MG PO TABS: 10.0000 mg | ORAL_TABLET | Freq: Every day | ORAL | 0 refills | Status: DC

## 2019-02-25 ENCOUNTER — Other Ambulatory Visit: Payer: Self-pay

## 2019-02-25 DIAGNOSIS — F79 Unspecified intellectual disabilities: Secondary | ICD-10-CM

## 2019-02-26 MED ORDER — MEDROXYPROGESTERONE ACETATE 10 MG PO TABS: 10.0000 mg | ORAL_TABLET | Freq: Every day | ORAL | 0 refills | Status: DC

## 2019-03-30 ENCOUNTER — Encounter: Payer: Self-pay | Admitting: Podiatry

## 2019-03-30 ENCOUNTER — Other Ambulatory Visit: Payer: Self-pay

## 2019-03-30 ENCOUNTER — Ambulatory Visit (INDEPENDENT_AMBULATORY_CARE_PROVIDER_SITE_OTHER): Payer: Medicare Other | Admitting: Podiatry

## 2019-03-30 DIAGNOSIS — B351 Tinea unguium: Secondary | ICD-10-CM | POA: Diagnosis not present

## 2019-03-30 DIAGNOSIS — L84 Corns and callosities: Secondary | ICD-10-CM

## 2019-03-30 DIAGNOSIS — M79674 Pain in right toe(s): Secondary | ICD-10-CM | POA: Diagnosis not present

## 2019-03-30 NOTE — Progress Notes (Signed)
Complaint:  Visit Type: Patient returns to my office for continued preventative foot care services. Complaint: Patient states" my big toenail right foot  Has  grown long and thick and become painful to walk and wear shoes" Patient has heel callus.. The patient presents for preventative foot care services. No changes to ROS  Podiatric Exam: Vascular: dorsalis pedis and posterior tibial pulses are palpable bilateral. Capillary return is immediate. Temperature gradient is WNL. Skin turgor WNL  Sensorium: Normal Semmes Weinstein monofilament test. Normal tactile sensation bilaterally. Nail Exam: Pt has thick disfigured discolored nails with subungual debris noted hallux  Right. Ulcer Exam: There is no evidence of ulcer or pre-ulcerative changes or infection. Orthopedic Exam: Muscle tone and strength are WNL. No limitations in general ROM. No crepitus or effusions noted. Foot type and digits show no abnormalities. Bony prominences are unremarkable. Skin: No Porokeratosis. No infection or ulcers.  Heel callus.  Diagnosis:  Onychomycosis, , Pain in right toe,   Treatment & Plan Procedures and Treatment: Consent by patient was obtained for treatment procedures.   Debridement of right mycotic nail.  Return Visit-Office Procedure: Patient instructed to return to the office for a follow up visit 3 months for continued evaluation and treatment.    Gardiner Barefoot DPM

## 2019-04-01 ENCOUNTER — Other Ambulatory Visit: Payer: Self-pay

## 2019-04-01 DIAGNOSIS — F79 Unspecified intellectual disabilities: Secondary | ICD-10-CM

## 2019-04-01 MED ORDER — MEDROXYPROGESTERONE ACETATE 10 MG PO TABS: 10.0000 mg | ORAL_TABLET | Freq: Every day | ORAL | 0 refills | Status: DC

## 2019-04-19 ENCOUNTER — Ambulatory Visit: Payer: Medicare Other | Admitting: Nurse Practitioner

## 2019-05-03 ENCOUNTER — Other Ambulatory Visit: Payer: Self-pay

## 2019-05-03 DIAGNOSIS — F79 Unspecified intellectual disabilities: Secondary | ICD-10-CM

## 2019-05-03 MED ORDER — MEDROXYPROGESTERONE ACETATE 10 MG PO TABS: 10.0000 mg | ORAL_TABLET | Freq: Every day | ORAL | 0 refills | Status: DC

## 2019-05-14 ENCOUNTER — Other Ambulatory Visit: Payer: Self-pay

## 2019-05-17 ENCOUNTER — Other Ambulatory Visit: Payer: Self-pay

## 2019-05-17 ENCOUNTER — Ambulatory Visit (INDEPENDENT_AMBULATORY_CARE_PROVIDER_SITE_OTHER): Payer: Medicare Other | Admitting: Nurse Practitioner

## 2019-05-17 ENCOUNTER — Encounter: Payer: Self-pay | Admitting: Nurse Practitioner

## 2019-05-17 VITALS — BP 107/77 | HR 76 | Temp 98.4°F | Ht 63.0 in | Wt 246.0 lb

## 2019-05-17 DIAGNOSIS — F79 Unspecified intellectual disabilities: Secondary | ICD-10-CM

## 2019-05-17 DIAGNOSIS — Z0001 Encounter for general adult medical examination with abnormal findings: Secondary | ICD-10-CM | POA: Diagnosis not present

## 2019-05-17 DIAGNOSIS — M25561 Pain in right knee: Secondary | ICD-10-CM | POA: Diagnosis not present

## 2019-05-17 DIAGNOSIS — Z0000 Encounter for general adult medical examination without abnormal findings: Secondary | ICD-10-CM

## 2019-05-17 DIAGNOSIS — R350 Frequency of micturition: Secondary | ICD-10-CM

## 2019-05-17 LAB — URINALYSIS, COMPLETE
Bilirubin, UA: NEGATIVE
Glucose, UA: NEGATIVE
Ketones, UA: NEGATIVE
Leukocytes,UA: NEGATIVE
Nitrite, UA: NEGATIVE
Protein,UA: NEGATIVE
RBC, UA: NEGATIVE
Specific Gravity, UA: >1.005 (ref 1.005–1.030)
Urobilinogen, Ur: 0.2 mg/dL (ref 0.2–1.0)
pH, UA: 7 (ref 5.0–7.5)

## 2019-05-17 LAB — MICROSCOPIC EXAMINATION
Epithelial Cells (non renal): >10 /hpf — AB (ref 0–10)
RBC: NONE SEEN /hpf (ref 0–2)
Renal Epithel, UA: NONE SEEN /hpf

## 2019-05-17 MED ORDER — MELOXICAM 15 MG PO TABS: 15.0000 mg | ORAL_TABLET | Freq: Every day | ORAL | 5 refills | Status: DC

## 2019-05-17 MED ORDER — MEDROXYPROGESTERONE ACETATE 10 MG PO TABS: 10.0000 mg | ORAL_TABLET | Freq: Every day | ORAL | 0 refills | Status: DC

## 2019-05-17 NOTE — Addendum Note (Signed)
Addended by: Rolena Infante on: 05/17/2019 02:38 PM   Modules accepted: Orders

## 2019-05-17 NOTE — Patient Instructions (Signed)

## 2019-05-17 NOTE — Progress Notes (Addendum)
Subjective:    Patient ID: Ashlee Hoffman, female    DOB: 1981/08/11, 38 y.o.   MRN: [id number]   Chief Complaint: Annual Exam (No pap)    HPI:  1. 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 She lives with her mom and step dad. Sh eis doing well.  2. Morbid obesity ([place]) No recent weight changes    Outpatient Encounter Medications as of 05/17/2019  Medication Sig  . Acetaminophen (TYLENOL ARTHRITIS EXT RELIEF PO) Take 1-2 tablets by mouth as needed. Reported on 01/12/2016  . medroxyPROGESTERone (PROVERA) 10 MG tablet Take 1 tablet (10 mg total) by mouth daily. (Please make yearly ckup)  . meloxicam (MOBIC) 15 MG tablet TAKE ONE TABLET BY MOUTH ONCE DAILY    Past Surgical History:  Procedure Laterality Date  . LUMBAR LAMINECTOMY/DECOMPRESSION MICRODISCECTOMY N/A 05/24/2013   Procedure: LUMBAR LAMINECTOMY/DECOMPRESSION MICRODISCECTOMY 1 LEVEL LEFT LUMBAR FIVE-SACARAL-ONE;  Surgeon: Charlie Pitter, MD;  Location: [facility];  Service: Neurosurgery;  Laterality: N/A;  left    Family History  Problem Relation Age of Onset  . Hypertension Mother   . Arthritis Mother   . Diabetes Maternal Grandfather   . Hypertension Maternal Grandfather   . Heart disease Maternal Grandfather     New complaints: Has had some pain in her right knee. Pain is intermittent. Walking up and down stairs increase pain. Describes pain as burning sensation.  Is seeing ortho for arthritis in right wrist- had injection yesterday. Has wrist brace that is falling apart- needs new brace. Social history: Lives with mom and step dad  Controlled substance contract: n/a   Review of Systems  Constitutional: Negative for activity change and appetite change.  HENT: Negative.   Eyes: Negative for pain.  Respiratory: Negative for shortness of breath.   Cardiovascular: Negative for chest pain, palpitations and leg swelling.  Gastrointestinal: Negative for abdominal pain.  Endocrine: Negative for  polydipsia.  Genitourinary: Negative.   Skin: Negative for rash.  Neurological: Negative for dizziness, weakness and headaches.  Hematological: Does not bruise/bleed easily.  Psychiatric/Behavioral: Negative.   All other systems reviewed and are negative.      Objective:   Physical Exam Vitals signs and nursing note reviewed.  Constitutional:      General: She is not in acute distress.    Appearance: Normal appearance. She is well-developed.  HENT:     Head: Normocephalic.     Nose: Nose normal.  Eyes:     Pupils: Pupils are equal, round, and reactive to light.  Neck:     Musculoskeletal: Normal range of motion and neck supple.     Vascular: No carotid bruit or JVD.  Cardiovascular:     Rate and Rhythm: Normal rate and regular rhythm.     Heart sounds: Normal heart sounds.  Pulmonary:     Effort: Pulmonary effort is normal. No respiratory distress.     Breath sounds: Normal breath sounds. No wheezing or rales.  Chest:     Chest wall: No tenderness.  Abdominal:     General: Bowel sounds are normal. There is no distension or abdominal bruit.     Palpations: Abdomen is soft. There is no hepatomegaly, splenomegaly, mass or pulsatile mass.     Tenderness: There is no abdominal tenderness.  Genitourinary:    Comments: No pelvic exam performed Musculoskeletal: Normal range of motion.     Comments: Mild knee effusion right Crepitus with flexion and extension Mild patella tenderness  Lymphadenopathy:  Cervical: No cervical adenopathy.  Skin:    General: Skin is warm and dry.  Neurological:     Mental Status: She is alert and oriented to person, place, and time.     Deep Tendon Reflexes: Reflexes are normal and symmetric.  Psychiatric:        Behavior: Behavior normal.        Thought Content: Thought content normal.        Judgment: Judgment normal.    BP 107/77   Pulse 76   Temp 98.4 F (36.9 C) (Oral)   Ht _0  (1.6 m)   Wt 246 lb (111.6 kg)   BMI 43.58 kg/m                                       Assessment & Plan:  Ashlee Hoffman comes in today with chief complaint of Annual Exam (No pap)   Diagnosis and orders addressed:  1. Annual physical exam - CMP14+EGFR - CBC with Differential/Platelet - Lipid panel - Thyroid Panel With TSH  2. Mentally disabled - medroxyPROGESTERone (PROVERA) 10 MG tablet; Take 1 tablet (10 mg total) by mouth daily. (Please make yearly ckup)  Dispense: 30 tablet; Refill: 0  3. Morbid obesity ([place]) Discussed diet and exercise for person with BMI >25 Will recheck weight in 3-6 months  4. Acute pain of right knee Rest See ortho- they will let me know when want referral - meloxicam (MOBIC) 15 MG tablet; Take 1 tablet (15 mg total) by mouth daily.  Dispense: 30 tablet; Refill: 5  5. Right wrist arthritis Wrist brace Keep follow up with ortho  Labs pending Health Maintenance reviewed Diet and exercise encouraged  Follow up plan: prn   Mary-Margaret Hassell Done, FNP

## 2019-05-18 LAB — CMP14+EGFR
ALT: 35 IU/L — ABNORMAL HIGH (ref 0–32)
AST: 26 IU/L (ref 0–40)
Albumin/Globulin Ratio: 1.9 (ref 1.2–2.2)
Albumin: 4.6 g/dL (ref 3.8–4.8)
Alkaline Phosphatase: 58 IU/L (ref 39–117)
BUN/Creatinine Ratio: 16 (ref 9–23)
BUN: 14 mg/dL (ref 6–20)
Bilirubin Total: 0.3 mg/dL (ref 0.0–1.2)
CO2: 23 mmol/L (ref 20–29)
Calcium: 9.8 mg/dL (ref 8.7–10.2)
Chloride: 101 mmol/L (ref 96–106)
Creatinine, Ser: 0.87 mg/dL (ref 0.57–1.00)
GFR calc Af Amer: 98 mL/min/{1.73_m2} (ref >59)
GFR calc non Af Amer: 85 mL/min/{1.73_m2} (ref >59)
Globulin, Total: 2.4 g/dL (ref 1.5–4.5)
Glucose: 77 mg/dL (ref 65–99)
Potassium: 4.5 mmol/L (ref 3.5–5.2)
Sodium: 138 mmol/L (ref 134–144)
Total Protein: 7 g/dL (ref 6.0–8.5)

## 2019-05-18 LAB — CBC WITH DIFFERENTIAL/PLATELET
Basophils Absolute: 0.1 10*3/uL (ref 0.0–0.2)
Basos: 1 %
EOS (ABSOLUTE): 0.2 10*3/uL (ref 0.0–0.4)
Eos: 2 %
Hematocrit: 40.2 % (ref 34.0–46.6)
Hemoglobin: 13.1 g/dL (ref 11.1–15.9)
Immature Grans (Abs): 0 10*3/uL (ref 0.0–0.1)
Immature Granulocytes: 0 %
Lymphocytes Absolute: 3.1 10*3/uL (ref 0.7–3.1)
Lymphs: 25 %
MCH: 29.5 pg (ref 26.6–33.0)
MCHC: 32.6 g/dL (ref 31.5–35.7)
MCV: 91 fL (ref 79–97)
Monocytes Absolute: 0.6 10*3/uL (ref 0.1–0.9)
Monocytes: 5 %
Neutrophils Absolute: 8.4 10*3/uL — ABNORMAL HIGH (ref 1.4–7.0)
Neutrophils: 67 %
Platelets: 342 10*3/uL (ref 150–450)
RBC: 4.44 x10E6/uL (ref 3.77–5.28)
RDW: 13.1 % (ref 11.7–15.4)
WBC: 12.4 10*3/uL — ABNORMAL HIGH (ref 3.4–10.8)

## 2019-05-18 LAB — LIPID PANEL
Chol/HDL Ratio: 4.4 ratio (ref 0.0–4.4)
Cholesterol, Total: 175 mg/dL (ref 100–199)
HDL: 40 mg/dL (ref >39)
LDL Chol Calc (NIH): 115 mg/dL — ABNORMAL HIGH (ref 0–99)
Triglycerides: 111 mg/dL (ref 0–149)
VLDL Cholesterol Cal: 20 mg/dL (ref 5–40)

## 2019-05-18 LAB — THYROID PANEL WITH TSH
Free Thyroxine Index: 2 (ref 1.2–4.9)
T3 Uptake Ratio: 28 % (ref 24–39)
T4, Total: 7.1 ug/dL (ref 4.5–12.0)
TSH: 1.5 u[IU]/mL (ref 0.450–4.500)

## 2019-06-02 ENCOUNTER — Other Ambulatory Visit: Payer: Self-pay

## 2019-06-02 DIAGNOSIS — F79 Unspecified intellectual disabilities: Secondary | ICD-10-CM

## 2019-06-03 MED ORDER — MEDROXYPROGESTERONE ACETATE 10 MG PO TABS: 10.0000 mg | ORAL_TABLET | Freq: Every day | ORAL | 0 refills | Status: DC

## 2019-07-03 ENCOUNTER — Other Ambulatory Visit: Payer: Self-pay

## 2019-07-03 DIAGNOSIS — F79 Unspecified intellectual disabilities: Secondary | ICD-10-CM

## 2019-07-05 ENCOUNTER — Ambulatory Visit (INDEPENDENT_AMBULATORY_CARE_PROVIDER_SITE_OTHER): Payer: Medicare Other | Admitting: Podiatry

## 2019-07-05 ENCOUNTER — Other Ambulatory Visit: Payer: Self-pay

## 2019-07-05 ENCOUNTER — Encounter: Payer: Self-pay | Admitting: Podiatry

## 2019-07-05 DIAGNOSIS — M79674 Pain in right toe(s): Secondary | ICD-10-CM | POA: Diagnosis not present

## 2019-07-05 DIAGNOSIS — B351 Tinea unguium: Secondary | ICD-10-CM

## 2019-07-05 DIAGNOSIS — Q828 Other specified congenital malformations of skin: Secondary | ICD-10-CM | POA: Diagnosis not present

## 2019-07-05 MED ORDER — MEDROXYPROGESTERONE ACETATE 10 MG PO TABS: 10.0000 mg | ORAL_TABLET | Freq: Every day | ORAL | 0 refills | Status: DC

## 2019-07-06 NOTE — Progress Notes (Signed)
Subjective: 38 y.o. returns the office today for painful, elongated, thickened toenails which she cannot trim herself. Denies any redness or drainage around the nails.  She also has calluses that she like to have trimmed.  Denies any acute changes since last appointment and no new complaints today. Denies any systemic complaints such as fevers, chills, nausea, vomiting.   Objective: AAO 3, NAD DP/PT pulses palpable, CRT less than 3 seconds  Nails hypertrophic, dystrophic, elongated, brittle, discolored 10. There is tenderness overlying the nails 1-5 bilaterally. There is no surrounding erythema or drainage along the nail sites. Minimal hyperkeratotic tissue is present bilateral submetatarsal 5 as well as hallux.  Upon debridement there is no underlying ulceration, drainage or any signs of infection. No open lesions or pre-ulcerative lesions are identified. No other areas of tenderness bilateral lower extremities. No overlying edema, erythema, increased warmth. No pain with calf compression, swelling, warmth, erythema.  Assessment: Patient presents with symptomatic onychomycosis; hyperkeratotic lesions  Plan: -Treatment options including alternatives, risks, complications were discussed -Nails sharply debrided 10 without complication/bleeding. -Minimal hyperkeratotic tissue with sharp debrided x4 without any complications or bleeding -Discussed daily foot inspection. If there are any changes, to call the office immediately.  -Follow-up in 3 months or sooner if any problems are to arise. In the meantime, encouraged to call the office with any questions, concerns, changes symptoms.  Celesta Gentile, DPM

## 2019-07-13 DIAGNOSIS — M25531 Pain in right wrist: Secondary | ICD-10-CM | POA: Insufficient documentation

## 2019-08-24 ENCOUNTER — Other Ambulatory Visit: Payer: Self-pay

## 2019-08-24 ENCOUNTER — Encounter: Payer: Self-pay | Admitting: Physical Therapy

## 2019-08-24 ENCOUNTER — Ambulatory Visit: Payer: Medicare Other | Attending: Orthopedic Surgery | Admitting: Physical Therapy

## 2019-08-24 DIAGNOSIS — M25561 Pain in right knee: Secondary | ICD-10-CM | POA: Insufficient documentation

## 2019-08-24 DIAGNOSIS — G8929 Other chronic pain: Secondary | ICD-10-CM | POA: Insufficient documentation

## 2019-08-24 NOTE — Therapy (Signed)
[facility] [place], [place], [address] Phone: [phone]   Fax:  [phone]  Physical Therapy Evaluation  Patient Details  Name: Ashlee Hoffman MRN: [id number] Date of Birth: [birth year] Referring Provider (PT): Gaynelle Arabian MD.   Encounter Date: 08/24/2019  PT End of Session - 08/24/19 1302    Visit Number  1    Number of Visits  6    Date for PT Re-Evaluation  09/14/19    PT Start Time  1114    PT Stop Time  1138    PT Time Calculation (min)  24 min    Activity Tolerance  Patient tolerated treatment well    Behavior During Therapy  [facility] for tasks assessed/performed       Past Medical History:  Diagnosis Date  . 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 Mental disability   . Shortness of breath    with exertion    Past Surgical History:  Procedure Laterality Date  . LUMBAR LAMINECTOMY/DECOMPRESSION MICRODISCECTOMY N/A 05/24/2013   Procedure: LUMBAR LAMINECTOMY/DECOMPRESSION MICRODISCECTOMY 1 LEVEL LEFT LUMBAR FIVE-SACARAL-ONE;  Surgeon: Charlie Pitter, MD;  Location: [facility];  Service: Neurosurgery;  Laterality: N/A;  left    There were no vitals filed for this visit.   Subjective Assessment - 08/24/19 1305    Subjective  COVID-19 screen performed prior to patient entering clinic.  The patient presents to the clinic with her Mother with c/o right knee pain that she has had for about a year.  She reports her knee hurts badly when performing stairs.  She has no pain at rest today.    Pertinent History  Mental disability, HTN, thyroid problem, lumbar surgery, right wrist surgery.    Patient Stated Goals  Go up and down stairs without pain.    Currently in Pain?  No/denies         [facility] PT Assessment - 08/24/19 0001      Assessment   Medical Diagnosis  Pain in right knee.    Referring Provider (PT)  Gaynelle Arabian MD.    Onset Date/Surgical Date  --   ~one tyear.     Precautions   Precautions  None      Restrictions   Weight Bearing Restrictions  No      Balance Screen   Has the patient fallen in the past 6 months  No    Has the patient had a decrease in activity level because of a fear of falling?   No    Is the patient reluctant to leave their home because of a fear of falling?   No      Prior Function   Level of Independence  Independent      Posture/Postural Control   Posture Comments  Right LE externally rotated.      ROM / Strength   AROM / PROM / Strength  AROM;Strength      AROM   Overall AROM Comments  Full right knee range of motion with notable crepitus.  Right SLR= 65 degrees.      Strength   Overall Strength Comments  Right hip abduction 4/5, right knee essentially normal.      Palpation   Palpation comment  Pain reported under right kneecap.      Special Tests   Other special tests  (+) right patello-femoral compression test.      Ambulation/Gait   Gait Comments  Patient walks with right LE externally rotated.  Objective measurements completed on examination: See above findings.                   PT Long Term Goals - 08/24/19 1338      PT LONG TERM GOAL #1   Title  Independent with a HEP.    Time  3    Period  Weeks    Status  New             Plan - 08/24/19 1332    Clinical Impression Statement  The patient presenst to [facility] with c/o right knee pain that can be severe with stairs.  This has been ongoing for about a year.  She has notable crepius with active right knee range of motion.  She demonstrated a positive right patello-femoral compression test.  Patient will benefit from skilled physical therapy intervention to address deficits and pain.    Personal Factors and Comorbidities  Comorbidity 1;Comorbidity 2    Comorbidities  Mental disability, HTN, thyroid problem, lumbar surgery, right wrist surgery.    Examination-Activity Limitations  Stairs    Examination-Participation Restrictions  Other     Stability/Clinical Decision Making  Evolving/Moderate complexity    Clinical Decision Making  Low    Rehab Potential  Excellent    PT Frequency  2x / week    PT Duration  3 weeks    PT Treatment/Interventions  ADLs/Self Care Home Management;Cryotherapy;Publishing copy;Therapeutic activities;Therapeutic exercise;Manual techniques;Passive range of motion;Vasopneumatic Device    PT Next Visit Plan  Stationary bike, sdly hip abduction, "clamshell"; PAIN-FREE right knee exercises, O and OKC exercises, patellar mobs.  Please establish a HEP.  Supine HSS>    Consulted and Agree with Plan of Care  Patient       Patient will benefit from skilled therapeutic intervention in order to improve the following deficits and impairments:  Pain, Decreased activity tolerance, Decreased strength  Visit Diagnosis: Chronic pain of right knee     Problem List Patient Active Problem List   Diagnosis Date Noted  . Wrist joint effusion 05/01/2018  . Morbid obesity ([place]) 07/04/2015  . HNP (herniated nucleus pulposus), lumbar 05/24/2013  . Mentally disabled 01/21/2013    Pernie Grosso, [place] MPT 08/24/2019, 1:39 PM  [facility] [address] [place], [place], [address] Phone: [phone]   Fax:  [phone]  Name: Ashlee Hoffman MRN: [id number] Date of Birth: [birth year]

## 2019-08-26 ENCOUNTER — Ambulatory Visit: Payer: Medicare Other | Admitting: Physical Therapy

## 2019-08-31 ENCOUNTER — Ambulatory Visit: Payer: Medicare Other | Admitting: *Deleted

## 2019-08-31 ENCOUNTER — Other Ambulatory Visit: Payer: Self-pay

## 2019-08-31 DIAGNOSIS — G8929 Other chronic pain: Secondary | ICD-10-CM

## 2019-08-31 DIAGNOSIS — M25561 Pain in right knee: Secondary | ICD-10-CM | POA: Diagnosis not present

## 2019-08-31 NOTE — Therapy (Signed)
[facility] [place], [place], [address] Phone: [phone]   Fax:  [phone]  Physical Therapy Treatment  Patient Details  Name: Ashlee Hoffman MRN: [id number] Date of Birth: [birth year] Referring Provider (PT): Gaynelle Arabian MD.   Encounter Date: 08/31/2019  PT End of Session - 08/31/19 2053    Visit Number  2    Number of Visits  6    Date for PT Re-Evaluation  09/14/19    PT Start Time  1115    PT Stop Time  1205    PT Time Calculation (min)  50 min       Past Medical History:  Diagnosis Date  . 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 Mental disability   . Shortness of breath    with exertion    Past Surgical History:  Procedure Laterality Date  . LUMBAR LAMINECTOMY/DECOMPRESSION MICRODISCECTOMY N/A 05/24/2013   Procedure: LUMBAR LAMINECTOMY/DECOMPRESSION MICRODISCECTOMY 1 LEVEL LEFT LUMBAR FIVE-SACARAL-ONE;  Surgeon: Charlie Pitter, MD;  Location: [facility];  Service: Neurosurgery;  Laterality: N/A;  left    There were no vitals filed for this visit.  Subjective Assessment - 08/31/19 1116    Subjective  COVID-19 screen performed prior to patient entering clinic.Marland Kitchen  She reports her knee hurts badly when performing stairs.  She has no pain at rest today.    Pertinent History  Mental disability, HTN, thyroid problem, lumbar surgery, right wrist surgery.    Patient Stated Goals  Go up and down stairs without pain.                       [facility] PT Treatment/Exercise - 08/31/19 0001      Exercises   Exercises  Knee/Hip      Knee/Hip Exercises: Aerobic   Nustep  L3 x 8.5 mins      Knee/Hip Exercises: Standing   Rocker Board  3 minutes      Knee/Hip Exercises: Seated   Long Arc Quad  Right;3 sets;10 reps   with ball squeeze   Long Arc Quad Weight  2 lbs.    Clamshell with TheraBand  Yellow   3x10   Hamstring Curl  Right;2 sets;10 reps      Manual Therapy   Manual Therapy  Soft tissue  mobilization    Soft tissue mobilization  patella mobs                  PT Long Term Goals - 08/24/19 1338      PT LONG TERM GOAL #1   Title  Independent with a HEP.    Time  3    Period  Weeks    Status  New            Plan - 08/31/19 1117    Clinical Impression Statement  Pt arrived today with minimal RT knee pain. She was able to complete therex with focus on quad control and strengthening. Minimal increase pain, but intermittent crepitus was felt. Pt needs constant verbal cues for technique    Personal Factors and Comorbidities  Comorbidity 1;Comorbidity 2    Comorbidities  Mental disability, HTN, thyroid problem, lumbar surgery, right wrist surgery.    Examination-Activity Limitations  Stairs    Examination-Participation Restrictions  Other    Stability/Clinical Decision Making  Evolving/Moderate complexity    Rehab Potential  Excellent    PT Frequency  2x / week    PT Duration  3 weeks  PT Treatment/Interventions  ADLs/Self Care Home Management;Cryotherapy;Publishing copy;Therapeutic activities;Therapeutic exercise;Manual techniques;Passive range of motion;Vasopneumatic Device    PT Next Visit Plan  Stationary bike, sdly hip abduction, "clamshell"; PAIN-FREE right knee exercises, O and OKC exercises, patellar mobs.  Please establish a HEP.  Supine HSS>       Patient will benefit from skilled therapeutic intervention in order to improve the following deficits and impairments:     Visit Diagnosis: Chronic pain of right knee     Problem List Patient Active Problem List   Diagnosis Date Noted  . Wrist joint effusion 05/01/2018  . Morbid obesity ([place]) 07/04/2015  . HNP (herniated nucleus pulposus), lumbar 05/24/2013  . Mentally disabled 01/21/2013    Emidio Warrell,CHRIS,PTA 08/31/2019, 9:01 PM  [facility] [address] [place], [place], [address] Phone:  [phone]   Fax:  [phone]  Name: Ashlee Hoffman MRN: [id number] Date of Birth: March 21, 1981

## 2019-09-02 ENCOUNTER — Ambulatory Visit: Payer: Medicare Other | Admitting: *Deleted

## 2019-09-02 ENCOUNTER — Other Ambulatory Visit: Payer: Self-pay

## 2019-09-02 DIAGNOSIS — G8929 Other chronic pain: Secondary | ICD-10-CM

## 2019-09-02 DIAGNOSIS — M25561 Pain in right knee: Secondary | ICD-10-CM | POA: Diagnosis not present

## 2019-09-02 NOTE — Therapy (Signed)
[facility] [place], [place], [address] Phone: [phone]   Fax:  [phone]  Physical Therapy Treatment  Patient Details  Name: Ashlee Hoffman MRN: [id number] Date of Birth: 15-Sep-1981 Referring Provider (PT): Gaynelle Arabian MD.   Encounter Date: 09/02/2019  PT End of Session - 09/02/19 1207    Visit Number  3    Date for PT Re-Evaluation  09/14/19    PT Start Time  1115    PT Stop Time  1205    PT Time Calculation (min)  50 min       Past Medical History:  Diagnosis Date  . 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 Mental disability   . Shortness of breath    with exertion    Past Surgical History:  Procedure Laterality Date  . LUMBAR LAMINECTOMY/DECOMPRESSION MICRODISCECTOMY N/A 05/24/2013   Procedure: LUMBAR LAMINECTOMY/DECOMPRESSION MICRODISCECTOMY 1 LEVEL LEFT LUMBAR FIVE-SACARAL-ONE;  Surgeon: Charlie Pitter, MD;  Location: [facility];  Service: Neurosurgery;  Laterality: N/A;  left    There were no vitals filed for this visit.  Subjective Assessment - 09/02/19 1422    Subjective  COVID-19 screen performed prior to patient entering clinic.Marland Kitchen  She reports her knee hurts badly when performing stairs. No pain at rest today. Still popping    Pertinent History  Mental disability, HTN, thyroid problem, lumbar surgery, right wrist surgery.    Patient Stated Goals  Go up and down stairs without pain.                       [facility] PT Treatment/Exercise - 09/02/19 0001      Exercises   Exercises  Knee/Hip      Knee/Hip Exercises: Aerobic   Nustep  L5 x 12 mins      Knee/Hip Exercises: Standing   Rocker Board  3 minutes   calf stretching     Knee/Hip Exercises: Seated   Long Arc Quad  Right;3 sets;10 reps    Long Arc Quad Weight  2 lbs.    Clamshell with TheraBand  Red   3x10   Hamstring Curl  Right;2 sets;10 reps      Knee/Hip Exercises: Supine   Quad Sets  AROM;Right;1 set;10 reps    Straight  Leg Raises  AROM;AAROM;3 sets;10 reps      Knee/Hip Exercises: Sidelying   Hip ABduction  AROM;3 sets;10 reps;Right      Manual Therapy   Manual Therapy  Soft tissue mobilization    Soft tissue mobilization  patella mobs                  PT Long Term Goals - 08/24/19 1338      PT LONG TERM GOAL #1   Title  Independent with a HEP.    Time  3    Period  Weeks    Status  New            Plan - 09/02/19 1414    Clinical Impression Statement  Pt arrived today doing fairly well with minimal RT knee pain. She was gueded through LT LE strengthening exs and requires constant verbal and some tactile cuing for technique. Weakness still notable in SLR and LAQs. LTGs are ongoing at this time    Comorbidities  Mental disability, HTN, thyroid problem, lumbar surgery, right wrist surgery.    Examination-Activity Limitations  Stairs    Examination-Participation Restrictions  Other    Stability/Clinical Decision Making  Evolving/Moderate complexity    Rehab Potential  Excellent    PT Frequency  2x / week    PT Treatment/Interventions  ADLs/Self Care Home Management;Cryotherapy;Publishing copy;Therapeutic activities;Therapeutic exercise;Manual techniques;Passive range of motion;Vasopneumatic Device    PT Next Visit Plan  Stationary bike, sdly hip abduction, "clamshell"; PAIN-FREE right knee exercises, O and OKC exercises, patellar mobs.  Please establish a HEP.  Supine HSS>    Consulted and Agree with Plan of Care  Patient       Patient will benefit from skilled therapeutic intervention in order to improve the following deficits and impairments:  Pain, Decreased activity tolerance, Decreased strength  Visit Diagnosis: Chronic pain of right knee     Problem List Patient Active Problem List   Diagnosis Date Noted  . Wrist joint effusion 05/01/2018  . Morbid obesity ([place]) 07/04/2015  . HNP (herniated nucleus pulposus), lumbar  05/24/2013  . Mentally disabled 01/21/2013    Rosangela Fehrenbach,CHRIS, PTA 09/02/2019, 2:24 PM  [facility] [address] [place], [place], [address] Phone: [phone]   Fax:  [phone]  Name: Ashlee Hoffman MRN: [id number] Date of Birth: 1981/03/16

## 2019-09-06 ENCOUNTER — Other Ambulatory Visit: Payer: Self-pay

## 2019-09-06 DIAGNOSIS — F79 Unspecified intellectual disabilities: Secondary | ICD-10-CM

## 2019-09-06 MED ORDER — MEDROXYPROGESTERONE ACETATE 10 MG PO TABS: 10.0000 mg | ORAL_TABLET | Freq: Every day | ORAL | 0 refills | Status: DC

## 2019-09-21 ENCOUNTER — Ambulatory Visit: Payer: Medicare Other | Attending: Orthopedic Surgery | Admitting: Physical Therapy

## 2019-09-21 ENCOUNTER — Encounter: Payer: Self-pay | Admitting: Physical Therapy

## 2019-09-21 ENCOUNTER — Other Ambulatory Visit: Payer: Self-pay

## 2019-09-21 DIAGNOSIS — G8929 Other chronic pain: Secondary | ICD-10-CM | POA: Diagnosis present

## 2019-09-21 DIAGNOSIS — M25561 Pain in right knee: Secondary | ICD-10-CM | POA: Diagnosis present

## 2019-09-21 NOTE — Therapy (Signed)
[facility] [place], [place], [address] Phone: [phone]   Fax:  [phone]  Physical Therapy Treatment  Patient Details  Name: Taiwo Bode MRN: [id number] Date of Birth: January 06, 1981 Referring Provider (PT): Gaynelle Arabian MD.   Encounter Date: 09/21/2019  PT End of Session - 09/21/19 1033    Visit Number  4    Number of Visits  6    Date for PT Re-Evaluation  09/14/19    PT Start Time  1032    PT Stop Time  1112    PT Time Calculation (min)  40 min    Activity Tolerance  Patient tolerated treatment well    Behavior During Therapy  [facility] for tasks assessed/performed       Past Medical History:  Diagnosis Date  . 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 Mental disability   . Shortness of breath    with exertion    Past Surgical History:  Procedure Laterality Date  . LUMBAR LAMINECTOMY/DECOMPRESSION MICRODISCECTOMY N/A 05/24/2013   Procedure: LUMBAR LAMINECTOMY/DECOMPRESSION MICRODISCECTOMY 1 LEVEL LEFT LUMBAR FIVE-SACARAL-ONE;  Surgeon: Charlie Pitter, MD;  Location: [place] [facility];  Service: Neurosurgery;  Laterality: N/A;  left    There were no vitals filed for this visit.  Subjective Assessment - 09/21/19 1033    Subjective  COVID 19 screening performed on patient upon arrival. Patient reports a little pain.    Pertinent History  Mental disability, HTN, thyroid problem, lumbar surgery, right wrist surgery.    Patient Stated Goals  Go up and down stairs without pain.    Currently in Pain?  Other (Comment)   No pain assessment provided by patient        [facility] PT Assessment - 09/21/19 0001      Assessment   Medical Diagnosis  Pain in right knee.    Referring Provider (PT)  Gaynelle Arabian MD.      Precautions   Precautions  None      Restrictions   Weight Bearing Restrictions  No                   [facility] Adult PT Treatment/Exercise - 09/21/19 0001      Knee/Hip Exercises: Aerobic   Nustep  L5 x15 min      Knee/Hip Exercises: Standing   Heel Raises  Both;20 reps    Heel Raises Limitations  B toe raise x20 reps    Hip Abduction  AROM;Both;2 sets;10 reps;Knee straight    Rocker Board  3 minutes   with assist for calf stretch     Knee/Hip Exercises: Seated   Long Arc Quad  Strengthening;Right;2 sets;10 reps;Weights    Long Arc Quad Weight  3 lbs.    Ball Squeeze  x20 reps    Clamshell with TheraBand  Red   x20 reps   Hamstring Curl  Strengthening;Right;2 sets;10 reps    Hamstring Limitations  red theraband    Sit to Sand  15 reps;without UE support      Knee/Hip Exercises: Supine   Bridges  Strengthening;2 sets;10 reps    Straight Leg Raises  AROM;Right;2 sets;10 reps      Knee/Hip Exercises: Sidelying   Hip ABduction  AROM;Right;2 sets;10 reps    Clams  RLE clam x20 reps                  PT Long Term Goals - 08/24/19 1338      PT LONG TERM GOAL #1  Title  Independent with a HEP.    Time  3    Period  Weeks    Status  New            Plan - 09/21/19 1123    Clinical Impression Statement  Patient presented in clinic with reports of continued R knee pain in which she has not seen any improvement. Patient guided through knee/hip strengthening exercises in multiple positions with multimodal cueing to improve technique. Patient continues to report more pain with stair ambulation.    Personal Factors and Comorbidities  Comorbidity 1;Comorbidity 2    Comorbidities  Mental disability, HTN, thyroid problem, lumbar surgery, right wrist surgery.    Examination-Activity Limitations  Stairs    Examination-Participation Restrictions  Other    Stability/Clinical Decision Making  Evolving/Moderate complexity    Rehab Potential  Excellent    PT Frequency  2x / week    PT Duration  3 weeks    PT Treatment/Interventions  ADLs/Self Care Home Management;Cryotherapy;Publishing copy;Therapeutic activities;Therapeutic exercise;Manual  techniques;Passive range of motion;Vasopneumatic Device    PT Next Visit Plan  Continue pain free knee strengthening.    Consulted and Agree with Plan of Care  Patient       Patient will benefit from skilled therapeutic intervention in order to improve the following deficits and impairments:  Pain, Decreased activity tolerance, Decreased strength  Visit Diagnosis: Chronic pain of right knee     Problem List Patient Active Problem List   Diagnosis Date Noted  . Wrist joint effusion 05/01/2018  . Morbid obesity ([place]) 07/04/2015  . HNP (herniated nucleus pulposus), lumbar 05/24/2013  . Mentally disabled 01/21/2013    Standley Brooking, PTA 09/21/2019, 11:29 AM  [facility] [place], [place], [address] Phone: [phone]   Fax:  [phone]  Name: Anelisse Debes MRN: [id number] Date of Birth: 06/24/1981

## 2019-09-24 ENCOUNTER — Ambulatory Visit: Payer: Medicare Other | Admitting: *Deleted

## 2019-09-28 ENCOUNTER — Ambulatory Visit: Payer: Medicare Other | Admitting: Physical Therapy

## 2019-09-28 ENCOUNTER — Other Ambulatory Visit: Payer: Self-pay

## 2019-09-28 ENCOUNTER — Encounter: Payer: Self-pay | Admitting: Physical Therapy

## 2019-09-28 DIAGNOSIS — G8929 Other chronic pain: Secondary | ICD-10-CM

## 2019-09-28 DIAGNOSIS — M25561 Pain in right knee: Secondary | ICD-10-CM

## 2019-09-28 NOTE — Therapy (Signed)
[facility] [place], [place], [address] Phone: [phone]   Fax:  [phone]  Physical Therapy Treatment  Patient Details  Name: Ashlee Hoffman MRN: [id number] Date of Birth: [birth year] Referring Provider (PT): Gaynelle Arabian MD.   Encounter Date: 09/28/2019  PT End of Session - 09/28/19 1128    Visit Number  5    Number of Visits  6    Date for PT Re-Evaluation  09/14/19    PT Start Time  1115    PT Stop Time  1158    PT Time Calculation (min)  43 min    Activity Tolerance  Patient tolerated treatment well    Behavior During Therapy  [facility] for tasks assessed/performed       Past Medical History:  Diagnosis Date  . 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 Mental disability   . Shortness of breath    with exertion    Past Surgical History:  Procedure Laterality Date  . LUMBAR LAMINECTOMY/DECOMPRESSION MICRODISCECTOMY N/A 05/24/2013   Procedure: LUMBAR LAMINECTOMY/DECOMPRESSION MICRODISCECTOMY 1 LEVEL LEFT LUMBAR FIVE-SACARAL-ONE;  Surgeon: Charlie Pitter, MD;  Location: [place] [facility];  Service: Neurosurgery;  Laterality: N/A;  left    There were no vitals filed for this visit.  Subjective Assessment - 09/28/19 1128    Subjective  COVID 19 screening performed on patient upon arrival. Patient reports feeling pretty good today.    Pertinent History  Mental disability, HTN, thyroid problem, lumbar surgery, right wrist surgery.    Patient Stated Goals  Go up and down stairs without pain.    Currently in Pain?  Other (Comment)         [facility] PT Assessment - 09/28/19 0001      Assessment   Medical Diagnosis  Pain in right knee.    Referring Provider (PT)  Gaynelle Arabian MD.      Precautions   Precautions  None      Restrictions   Weight Bearing Restrictions  No                   [facility] Adult PT Treatment/Exercise - 09/28/19 0001      Exercises   Exercises  Knee/Hip      Knee/Hip Exercises: Aerobic   Nustep  L5  x15 min      Knee/Hip Exercises: Standing   Heel Raises  Both;20 reps    Heel Raises Limitations  B toe raise x20 reps    Hip Abduction  AROM;Both;2 sets;10 reps;Knee straight    Forward Step Up  Both;2 sets;10 reps;Step Height: 4"    Rocker Board  3 minutes   assistance for calf stretching     Knee/Hip Exercises: Seated   Long Arc Quad  Strengthening;Right;2 sets;10 reps;Weights    Long Arc Quad Weight  3 lbs.    Ball Squeeze  x20 reps    Clamshell with [employer]   x20   Marching  Strengthening;Right;20 reps;Weights    Marching Limitations  3    Hamstring Curl  Strengthening;Right;2 sets;10 reps    Hamstring Limitations  red theraband      Knee/Hip Exercises: Supine   Bridges  Strengthening;2 sets;10 reps    Straight Leg Raises  AROM;Right;2 sets;10 reps      Knee/Hip Exercises: Sidelying   Hip ABduction  AROM;Right;2 sets;10 reps                  PT Long Term Goals - 09/28/19 1247  PT LONG TERM GOAL #1   Title  Independent with a HEP.    Time  3    Period  Weeks    Status  On-going            Plan - 09/28/19 1244    Clinical Impression Statement  Patient arrived with reports of feeling better and mother stating she has not heard her complain of right knee pain. Patient was guided through TEs with good technique when provided with constant demonstration throughout exercise. Patient and patient's mother discussed one more visit then DC.    Personal Factors and Comorbidities  Comorbidity 1;Comorbidity 2    Comorbidities  Mental disability, HTN, thyroid problem, lumbar surgery, right wrist surgery.    Examination-Activity Limitations  Stairs    Examination-Participation Restrictions  Other    Stability/Clinical Decision Making  Evolving/Moderate complexity    Clinical Decision Making  Low    Rehab Potential  Excellent    PT Frequency  2x / week    PT Duration  3 weeks    PT Treatment/Interventions  ADLs/Self Care Home  Management;Cryotherapy;Publishing copy;Therapeutic activities;Therapeutic exercise;Manual techniques;Passive range of motion;Vasopneumatic Device    PT Next Visit Plan  Continue pain free knee strengthening.    Consulted and Agree with Plan of Care  Patient       Patient will benefit from skilled therapeutic intervention in order to improve the following deficits and impairments:  Pain, Decreased activity tolerance, Decreased strength  Visit Diagnosis: Chronic pain of right knee     Problem List Patient Active Problem List   Diagnosis Date Noted  . Wrist joint effusion 05/01/2018  . Morbid obesity ([place]) 07/04/2015  . HNP (herniated nucleus pulposus), lumbar 05/24/2013  . Mentally disabled 01/21/2013    Gabriela Eves, PT, DPT 09/28/2019, 12:48 PM  [facility] [address] [place], [place], [address] Phone: [phone]   Fax:  [phone]  Name: Ashlee Hoffman MRN: [id number] Date of Birth: Jun 01, 1981

## 2019-10-05 ENCOUNTER — Ambulatory Visit: Payer: Medicare Other | Admitting: *Deleted

## 2019-10-05 ENCOUNTER — Other Ambulatory Visit: Payer: Self-pay

## 2019-10-05 ENCOUNTER — Ambulatory Visit (INDEPENDENT_AMBULATORY_CARE_PROVIDER_SITE_OTHER): Payer: Medicare Other | Admitting: Podiatry

## 2019-10-05 DIAGNOSIS — M25561 Pain in right knee: Secondary | ICD-10-CM | POA: Diagnosis not present

## 2019-10-05 DIAGNOSIS — M79674 Pain in right toe(s): Secondary | ICD-10-CM

## 2019-10-05 DIAGNOSIS — Q828 Other specified congenital malformations of skin: Secondary | ICD-10-CM | POA: Diagnosis not present

## 2019-10-05 DIAGNOSIS — G8929 Other chronic pain: Secondary | ICD-10-CM

## 2019-10-05 DIAGNOSIS — B351 Tinea unguium: Secondary | ICD-10-CM

## 2019-10-05 NOTE — Therapy (Signed)
[facility] [place], [place], [address] Phone: [phone]   Fax:  [phone]  Physical Therapy Treatment  Patient Details  Name: Ashlee Hoffman MRN: [id number] Date of Birth: [birth year] Referring Provider (PT): Gaynelle Arabian MD.   Encounter Date: 10/05/2019  PT End of Session - 10/05/19 1355    Visit Number  6    Number of Visits  6    Date for PT Re-Evaluation  09/14/19    PT Start Time  1157    PT Stop Time  1435    PT Time Calculation (min)  50 min       Past Medical History:  Diagnosis Date  . 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 Mental disability   . Shortness of breath    with exertion    Past Surgical History:  Procedure Laterality Date  . LUMBAR LAMINECTOMY/DECOMPRESSION MICRODISCECTOMY N/A 05/24/2013   Procedure: LUMBAR LAMINECTOMY/DECOMPRESSION MICRODISCECTOMY 1 LEVEL LEFT LUMBAR FIVE-SACARAL-ONE;  Surgeon: Charlie Pitter, MD;  Location: [place];  Service: Neurosurgery;  Laterality: N/A;  left    There were no vitals filed for this visit.  Subjective Assessment - 10/05/19 1351    Subjective  COVID 19 screening performed on patient upon arrival.  DC today    Pertinent History  Mental disability, HTN, thyroid problem, lumbar surgery, right wrist surgery.    Patient Stated Goals  Go up and down stairs without pain.    Currently in Pain?  Yes    Pain Score  2                        [facility] Adult PT Treatment/Exercise - 10/05/19 0001      Exercises   Exercises  Knee/Hip      Knee/Hip Exercises: Aerobic   Nustep  L5 x15 min      Knee/Hip Exercises: Standing   Heel Raises  Both;20 reps    Hip Abduction  AROM;Both;2 sets;10 reps;Knee straight    Forward Step Up  Both;2 sets;10 reps;Step Height: 4"    Rocker Board  3 minutes   assistance for calf stretching     Knee/Hip Exercises: Seated   Long Arc Quad  Strengthening;Right;2 sets;10 reps;Weights    Long Arc Quad Weight  3 lbs.    Ball  Squeeze  x20 reps    Clamshell with TheraBand  Red    Hamstring Curl  Strengthening;Right;2 sets;10 reps    Hamstring Limitations  red theraband    Sit to Sand  --      Knee/Hip Exercises: Supine   Straight Leg Raises  AROM;Right;2 sets;10 reps      Knee/Hip Exercises: Sidelying   Hip ABduction  AROM;Right;2 sets;10 reps          HEP reviewed/ discussed and sheet given        PT Education - 10/05/19 1532    Education Details  RT LE strengthening exs    Person(s) Educated  Patient    Methods  Explanation;Demonstration;Tactile cues;Verbal cues;Handout    Comprehension  Verbalized understanding;Returned demonstration          PT Long Term Goals - 10/05/19 1355      PT LONG TERM GOAL #1   Title  Independent with a HEP.    Time  3    Period  Weeks    Status  Achieved            Plan - 10/05/19 1533  Clinical Impression Statement  Pt arrived today doing fairly well with minimal RT knee pain ,but with continued patella-femoral crepitation. She was guided throgh RT LE strengthening and given ex sheet for HEP. LTG met.    Personal Factors and Comorbidities  Comorbidity 1;Comorbidity 2    Comorbidities  Mental disability, HTN, thyroid problem, lumbar surgery, right wrist surgery.    Examination-Activity Limitations  Stairs    Examination-Participation Restrictions  Other    Stability/Clinical Decision Making  Evolving/Moderate complexity    Rehab Potential  Excellent    PT Frequency  2x / week    PT Duration  3 weeks    PT Treatment/Interventions  ADLs/Self Care Home Management;Cryotherapy;Publishing copy;Therapeutic activities;Therapeutic exercise;Manual techniques;Passive range of motion;Vasopneumatic Device    PT Next Visit Plan  DC to HEP       Patient will benefit from skilled therapeutic intervention in order to improve the following deficits and impairments:  Pain, Decreased activity tolerance, Decreased  strength  Visit Diagnosis: Chronic pain of right knee     Problem List Patient Active Problem List   Diagnosis Date Noted  . Wrist joint effusion 05/01/2018  . Morbid obesity ([place]) 07/04/2015  . HNP (herniated nucleus pulposus), lumbar 05/24/2013  . Mentally disabled 01/21/2013    Antowan Samford,CHRIS , PTA 10/05/2019, 3:48 PM  [facility] [address] [place], [place], [address] Phone: [phone]   Fax:  [phone]  Name: Exilda Wilhite MRN: [id number] Date of Birth: Aug 11, 1981  PHYSICAL THERAPY DISCHARGE SUMMARY  Visits from Start of Care: 6.  Current functional level related to goals / functional outcomes: See above.   Remaining deficits: LTG met.   Education / Equipment: HEP.  Plan: Patient agrees to discharge.  Patient goals were met. Patient is being discharged due to meeting the stated rehab goals.  ?????         Mali Applegate MPT

## 2019-10-10 NOTE — Progress Notes (Signed)
Subjective: 39 y.o. returns the office today for painful, elongated, thickened toenails which she cannot trim herself. Denies any redness or drainage around the nails.  She also has calluses that she like to have trimmed.  No new complaints today. Denies any systemic complaints such as fevers, chills, nausea, vomiting.   Objective: AAO 3, NAD DP/PT pulses palpable, CRT less than 3 seconds  Nails hypertrophic, dystrophic, elongated, brittle, discolored 10. There is tenderness overlying the nails 1-5 bilaterally. There is no surrounding erythema or drainage along the nail sites. Minimal hyperkeratotic tissue is present bilateral submetatarsal 5 as well as hallux. Upon debridement there is no underlying ulceration, drainage or any signs of infection. No open lesions or pre-ulcerative lesions are identified. No other areas of tenderness bilateral lower extremities. No overlying edema, erythema, increased warmth. No pain with calf compression, swelling, warmth, erythema.  Assessment: Patient presents with symptomatic onychomycosis; hyperkeratotic lesions  Plan: -Treatment options including alternatives, risks, complications were discussed -Nails sharply debrided 10 without complication/bleeding. -Minimal hyperkeratotic tissue with sharp debrided x4 without any complications or bleeding -Discussed daily foot inspection. If there are any changes, to call the office immediately.  -Follow-up in 3 months or sooner if any problems are to arise. In the meantime, encouraged to call the office with any questions, concerns, changes symptoms.  Celesta Gentile, DPM

## 2019-10-16 ENCOUNTER — Other Ambulatory Visit: Payer: Self-pay | Admitting: Nurse Practitioner

## 2019-10-16 DIAGNOSIS — F79 Unspecified intellectual disabilities: Secondary | ICD-10-CM

## 2019-10-18 MED ORDER — MEDROXYPROGESTERONE ACETATE 10 MG PO TABS: 10.0000 mg | ORAL_TABLET | Freq: Every day | ORAL | 0 refills | Status: DC

## 2019-10-27 ENCOUNTER — Ambulatory Visit (INDEPENDENT_AMBULATORY_CARE_PROVIDER_SITE_OTHER): Payer: Medicare Other | Admitting: Family Medicine

## 2019-10-27 ENCOUNTER — Encounter: Payer: Self-pay | Admitting: Family Medicine

## 2019-10-27 DIAGNOSIS — B001 Herpesviral vesicular dermatitis: Secondary | ICD-10-CM | POA: Diagnosis not present

## 2019-10-27 MED ORDER — VALACYCLOVIR HCL 1 G PO TABS: 2000.0000 mg | ORAL_TABLET | Freq: Two times a day (BID) | ORAL | 0 refills | Status: AC

## 2019-10-27 MED ORDER — ACYCLOVIR 5 % EX OINT: 1.0000 "application " | TOPICAL_OINTMENT | CUTANEOUS | 1 refills | Status: DC | PRN

## 2019-10-27 NOTE — Progress Notes (Signed)
Subjective:    Patient ID: Ashlee Hoffman, female    DOB: 1981-03-19, 39 y.o.   MRN: [id number]   HPI: Ashlee Hoffman is a 39 y.o. female presenting for  Breaking out around her mouth for about a week. Cold sores all around her mouth.  She has not had any upper respiratory symptoms cough or fever.  No earaches.   Depression screen [facility] 2/9 05/17/2019 11/13/2016 10/15/2016 06/10/2016 01/12/2016  Decreased Interest 0 0 0 0 0  Down, Depressed, Hopeless 0 0 0 0 0  PHQ - 2 Score 0 0 0 0 0     Relevant past medical, surgical, family and social history reviewed and updated as indicated.  Interim medical history since our last visit reviewed. Allergies and medications reviewed and updated.  ROS:  Review of Systems  Constitutional: Negative for appetite change, chills, diaphoresis, fatigue and fever.  HENT: Negative for congestion, ear pain, hearing loss, postnasal drip, rhinorrhea, sore throat and trouble swallowing.   Respiratory: Negative for cough, chest tightness and shortness of breath.   Cardiovascular: Negative for chest pain and palpitations.  Gastrointestinal: Negative for abdominal pain.  Musculoskeletal: Negative for arthralgias.  Skin: Negative for rash.     Social History   Tobacco Use  Smoking Status Never Smoker  Smokeless Tobacco Never Used       Objective:     Wt Readings from Last 3 Encounters:  05/17/19 246 lb (111.6 kg)  12/02/17 248 lb (112.5 kg)  11/13/16 242 lb (109.8 kg)     Exam deferred. Pt. Harboring due to COVID 19.  Video visit revealed that there are fine erythematous papules at the vermilion border of the lips all the way around to scattered about.  None can be seen internally.   Assessment & Plan:   1. Herpes labialis     Meds ordered this encounter  Medications  . valACYclovir (VALTREX) 1000 MG tablet    Sig: Take 2 tablets (2,000 mg total) by mouth 2 (two) times daily for 2 days.    Dispense:  8 tablet    Refill:  0  . acyclovir ointment  (ZOVIRAX) 5 %    Sig: Apply 1 application topically every 2 (two) hours as needed.    Dispense:  15 g    Refill:  1    No orders of the defined types were placed in this encounter.     Diagnoses and all orders for this visit:  Herpes labialis -     valACYclovir (VALTREX) 1000 MG tablet; Take 2 tablets (2,000 mg total) by mouth 2 (two) times daily for 2 days. -     acyclovir ointment (ZOVIRAX) 5 %; Apply 1 application topically every 2 (two) hours as needed.    Virtual Visit via telephone Note  I discussed the limitations, risks, security and privacy concerns of performing an evaluation and management service by telephone and the availability of in person appointments. The patient was identified with two identifiers. Pt.expressed understanding and agreed to proceed. Pt. Is at home. Dr. Livia Snellen is in his office.  Follow Up Instructions:   I discussed the assessment and treatment plan with the patient. The patient was provided an opportunity to ask questions and all were answered. The patient agreed with the plan and demonstrated an understanding of the instructions.   The patient was advised to call back or seek an in-person evaluation if the symptoms worsen or if the condition fails to improve as anticipated.   Total minutes  including chart review and phone contact time: 17   Follow up plan: No follow-ups on file.  Claretta Fraise, MD [place]

## 2019-11-23 DIAGNOSIS — M25531 Pain in right wrist: Secondary | ICD-10-CM | POA: Diagnosis not present

## 2019-11-23 DIAGNOSIS — M25431 Effusion, right wrist: Secondary | ICD-10-CM | POA: Diagnosis not present

## 2019-12-10 ENCOUNTER — Other Ambulatory Visit: Payer: Self-pay | Admitting: Nurse Practitioner

## 2019-12-10 DIAGNOSIS — F79 Unspecified intellectual disabilities: Secondary | ICD-10-CM

## 2019-12-27 ENCOUNTER — Other Ambulatory Visit: Payer: Self-pay | Admitting: Nurse Practitioner

## 2019-12-27 DIAGNOSIS — M25561 Pain in right knee: Secondary | ICD-10-CM

## 2020-01-03 ENCOUNTER — Ambulatory Visit (INDEPENDENT_AMBULATORY_CARE_PROVIDER_SITE_OTHER): Payer: Medicare Other | Admitting: Podiatry

## 2020-01-03 ENCOUNTER — Other Ambulatory Visit: Payer: Self-pay

## 2020-01-03 ENCOUNTER — Encounter: Payer: Self-pay | Admitting: Podiatry

## 2020-01-03 DIAGNOSIS — Q828 Other specified congenital malformations of skin: Secondary | ICD-10-CM | POA: Diagnosis not present

## 2020-01-03 DIAGNOSIS — M79674 Pain in right toe(s): Secondary | ICD-10-CM | POA: Diagnosis not present

## 2020-01-03 DIAGNOSIS — B351 Tinea unguium: Secondary | ICD-10-CM

## 2020-01-04 ENCOUNTER — Other Ambulatory Visit: Payer: Self-pay | Admitting: *Deleted

## 2020-01-04 DIAGNOSIS — F79 Unspecified intellectual disabilities: Secondary | ICD-10-CM

## 2020-01-04 MED ORDER — MEDROXYPROGESTERONE ACETATE 10 MG PO TABS: 10.0000 mg | ORAL_TABLET | Freq: Every day | ORAL | 4 refills | Status: DC

## 2020-01-05 ENCOUNTER — Other Ambulatory Visit: Payer: Self-pay

## 2020-01-05 ENCOUNTER — Ambulatory Visit (INDEPENDENT_AMBULATORY_CARE_PROVIDER_SITE_OTHER): Payer: Medicare Other | Admitting: Nurse Practitioner

## 2020-01-05 ENCOUNTER — Encounter: Payer: Self-pay | Admitting: Nurse Practitioner

## 2020-01-05 VITALS — BP 114/75 | HR 79 | Temp 97.8°F | Ht 63.0 in | Wt 247.2 lb

## 2020-01-05 DIAGNOSIS — M25561 Pain in right knee: Secondary | ICD-10-CM

## 2020-01-05 DIAGNOSIS — M25431 Effusion, right wrist: Secondary | ICD-10-CM

## 2020-01-05 DIAGNOSIS — M5126 Other intervertebral disc displacement, lumbar region: Secondary | ICD-10-CM

## 2020-01-05 DIAGNOSIS — F79 Unspecified intellectual disabilities: Secondary | ICD-10-CM

## 2020-01-05 MED ORDER — MELOXICAM 15 MG PO TABS: 15.0000 mg | ORAL_TABLET | Freq: Every day | ORAL | 3 refills | Status: DC

## 2020-01-05 MED ORDER — MEDROXYPROGESTERONE ACETATE 10 MG PO TABS: 10.0000 mg | ORAL_TABLET | Freq: Every day | ORAL | 3 refills | Status: DC

## 2020-01-05 NOTE — Progress Notes (Signed)
Subjective:    Patient ID: Ashlee Hoffman, female    DOB: 01/04/1981, 38 y.o.   MRN: [id number]   Chief Complaint: Medical Management of Chronic Issues    HPI:  1. Acute pain of right knee Saw orthopedic doctor that set her up for PT which she did. Is still having pain and grinding in her knee especially when going up stairs. Is taking mobic for pain and it is helping.  2. Mentally disabled Has been mentally disabled since birth and lives with mom and stepdad.   3. Effusion of right wrist Still having some pain in her right wrist but it is better since having surgery in October.  4. Morbid obesity (Ashlee Hoffman) No significant changes in weight.  BMI Readings from Last 3 Encounters:  01/05/20 43.79 kg/m  05/17/19 43.58 kg/m  12/02/17 43.93 kg/m   Wt Readings from Last 3 Encounters:  01/05/20 247 lb 3.2 oz (112.1 kg)  05/17/19 246 lb (111.6 kg)  12/02/17 248 lb (112.5 kg)   5. HNP (herniated nucleus pulposus), lumbar Sees Dr. Trenton Hoffman about once a year for follow-up but does still have some back pain.   Outpatient Encounter Medications as of 01/05/2020  Medication Sig  . Acetaminophen (TYLENOL ARTHRITIS EXT RELIEF PO) Take 1-2 tablets by mouth as needed. Reported on 01/12/2016  . medroxyPROGESTERone (PROVERA) 10 MG tablet Take 1 tablet (10 mg total) by mouth daily.  . meloxicam (MOBIC) 15 MG tablet Take 1 tablet (15 mg total) by mouth daily.  Marland Kitchen acyclovir ointment (ZOVIRAX) 5 % Apply 1 application topically every 2 (two) hours as needed. (Patient not taking: Reported on 01/05/2020)  . valACYclovir (VALTREX) 1000 MG tablet valacyclovir 1 gram tablet   No facility-administered encounter medications on file as of 01/05/2020.    Past Surgical History:  Procedure Laterality Date  . LUMBAR LAMINECTOMY/DECOMPRESSION MICRODISCECTOMY N/A 05/24/2013   Procedure: LUMBAR LAMINECTOMY/DECOMPRESSION MICRODISCECTOMY 1 LEVEL LEFT LUMBAR FIVE-SACARAL-ONE;  Surgeon: Ashlee Pitter, MD;  Location: [place] [facility];  Service: Neurosurgery;  Laterality: N/A;  left    Family History  Problem Relation Age of Onset  . Hypertension Mother   . Arthritis Mother   . Diabetes Maternal Grandfather   . Hypertension Maternal Grandfather   . Heart disease Maternal Grandfather     New complaints: None  Social history: Lives with mom and stepdad.  Controlled substance contract: N/A     Review of Systems  Constitutional: Negative.   HENT: Negative.   Eyes: Negative.   Respiratory: Negative.   Cardiovascular: Negative.   Gastrointestinal: Negative.   Endocrine: Negative.   Genitourinary: Negative.   Musculoskeletal: Positive for arthralgias (right knee and wrist), back pain and joint swelling (right wrist).  Skin: Negative.   Neurological: Negative.   Psychiatric/Behavioral: Negative.        Objective:   Physical Exam Vitals and nursing note reviewed.  HENT:     Head: Normocephalic.     Right Ear: Tympanic membrane normal.     Left Ear: Tympanic membrane normal.     Nose: Nose normal.     Mouth/Throat:     Mouth: Mucous membranes are moist.  Eyes:     Conjunctiva/sclera: Conjunctivae normal.     Pupils: Pupils are equal, round, and reactive to light.  Cardiovascular:     Rate and Rhythm: Normal rate and regular rhythm.     Pulses: Normal pulses.     Heart sounds: Normal heart sounds.  Pulmonary:     Effort: Pulmonary  effort is normal.     Breath sounds: Normal breath sounds.  Abdominal:     General: Bowel sounds are normal.     Palpations: Abdomen is soft.  Musculoskeletal:        General: Normal range of motion.     Cervical back: Normal range of motion.     Right knee: Crepitus present.     Right lower leg: 1+ Edema present.     Comments: Crepitus on flexion and extension of right knee  Skin:    General: Skin is warm and dry.     Capillary Refill: Capillary refill takes less than 2 seconds.  Neurological:     Mental Status: She is alert and oriented to person, place,  and time. Mental status is at baseline.  Psychiatric:        Mood and Affect: Mood normal.        Behavior: Behavior normal.   BP 114/75   Pulse 79   Temp 97.8 F (36.6 C) (Temporal)   Ht 5\' 3"  (1.6 m)   Wt 247 lb 3.2 oz (112.1 kg)   BMI 43.79 kg/m      Assessment & Plan:  Ashlee Hoffman comes in today with chief complaint of Medical Management of Chronic Issues   Diagnosis and orders addressed:  1. Acute pain of right knee Make follow-up appointment with orthopedics - meloxicam (MOBIC) 15 MG tablet; Take 1 tablet (15 mg total) by mouth daily.  Dispense: 90 tablet; Refill: 3  2. Mentally disabled - medroxyPROGESTERone (PROVERA) 10 MG tablet; Take 1 tablet (10 mg total) by mouth daily.  Dispense: 90 tablet; Refill: 3  3. Effusion of right wrist Follow up with Dr. Caralyn Hoffman as needed  4. Morbid obesity (Ashlee Hoffman) Discussed diet and exercise for person with BMI >25 Will recheck weight in 3-6 months  5. HNP (herniated nucleus pulposus), lumbar Follow up with Dr. Trenton Hoffman Increase exercise as able    Labs pending Health Maintenance reviewed Diet and exercise encouraged  Follow up plan: 6 months   Mary-Margaret Hassell Done, FNP

## 2020-01-05 NOTE — Patient Instructions (Signed)

## 2020-01-13 NOTE — Progress Notes (Signed)
Subjective: 39 y.o. returns the office today for painful, elongated, thickened toenails which she cannot trim herself and for calluses. Denies any redness or drainage around the nails or calluses.  No new complaints today. Denies any systemic complaints such as fevers, chills, nausea, vomiting.   Objective: AAO 3, NAD DP/PT pulses palpable, CRT less than 3 seconds  Nails hypertrophic, dystrophic, elongated, brittle, discolored 10. There is tenderness overlying the nails 1-5 bilaterally. There is no surrounding erythema or drainage along the nail sites. Hyperkeratotic tissue is present bilateral submetatarsal 5 and hallux. Upon debridement there is no underlying ulceration, drainage or any signs of infection. No open lesions or pre-ulcerative lesions are identified. No other areas of tenderness bilateral lower extremities. No overlying edema, erythema, increased warmth. No pain with calf compression, swelling, warmth, erythema.  Assessment: Patient presents with symptomatic onychomycosis; hyperkeratotic lesions  Plan: -Treatment options including alternatives, risks, complications were discussed -Nails sharply debrided 10 without complication/bleeding. -Minimal hyperkeratotic tissue with sharp debrided x4 without any complications or bleeding -Discussed daily foot inspection. If there are any changes, to call the office immediately.  -Follow-up in 3 months or sooner if any problems are to arise. In the meantime, encouraged to call the office with any questions, concerns, changes symptoms.  Celesta Gentile, DPM

## 2020-02-15 DIAGNOSIS — M25431 Effusion, right wrist: Secondary | ICD-10-CM | POA: Diagnosis not present

## 2020-02-15 DIAGNOSIS — M25531 Pain in right wrist: Secondary | ICD-10-CM | POA: Diagnosis not present

## 2020-04-03 ENCOUNTER — Ambulatory Visit (INDEPENDENT_AMBULATORY_CARE_PROVIDER_SITE_OTHER): Payer: Medicare Other | Admitting: Podiatry

## 2020-04-03 ENCOUNTER — Other Ambulatory Visit: Payer: Self-pay

## 2020-04-03 DIAGNOSIS — B351 Tinea unguium: Secondary | ICD-10-CM

## 2020-04-03 DIAGNOSIS — M79674 Pain in right toe(s): Secondary | ICD-10-CM

## 2020-04-03 DIAGNOSIS — Q828 Other specified congenital malformations of skin: Secondary | ICD-10-CM

## 2020-04-03 NOTE — Progress Notes (Signed)
Subjective: 39 y.o. returns the office today for painful, elongated, thickened toenails which she cannot trim herself and for calluses. Denies any redness or drainage around the nails or calluses.  No new complaints today. Denies any systemic complaints such as fevers, chills, nausea, vomiting.   Objective: AAO 3, NAD DP/PT pulses palpable, CRT less than 3 seconds  Nails hypertrophic, dystrophic, elongated, brittle, discolored 10. There is tenderness overlying the nails 1-5 bilaterally. There is no surrounding erythema or drainage along the nail sites. Hyperkeratotic tissue is present bilateral submetatarsal 5 and hallux. Upon debridement there is no underlying ulceration, drainage or any signs of infection. No open lesions or pre-ulcerative lesions are identified. No other areas of tenderness bilateral lower extremities. No overlying edema, erythema, increased warmth. No pain with calf compression, swelling, warmth, erythema.  Assessment: Patient presents with symptomatic onychomycosis; hyperkeratotic lesions  Plan: -Treatment options including alternatives, risks, complications were discussed -Nails sharply debrided 10 without complication/bleeding. -Hyperkeratotic tissue with sharp debrided x4 without any complications or bleeding -Discussed daily foot inspection. If there are any changes, to call the office immediately.  -Follow-up in 3 months or sooner if any problems are to arise. In the meantime, encouraged to call the office with any questions, concerns, changes symptoms.  Celesta Gentile, DPM

## 2020-07-03 ENCOUNTER — Ambulatory Visit (INDEPENDENT_AMBULATORY_CARE_PROVIDER_SITE_OTHER): Payer: Medicare Other | Admitting: Podiatry

## 2020-07-03 ENCOUNTER — Other Ambulatory Visit: Payer: Self-pay

## 2020-07-03 DIAGNOSIS — Q828 Other specified congenital malformations of skin: Secondary | ICD-10-CM

## 2020-07-03 DIAGNOSIS — G8929 Other chronic pain: Secondary | ICD-10-CM

## 2020-07-03 DIAGNOSIS — B351 Tinea unguium: Secondary | ICD-10-CM

## 2020-07-03 DIAGNOSIS — M25571 Pain in right ankle and joints of right foot: Secondary | ICD-10-CM

## 2020-07-03 DIAGNOSIS — M79674 Pain in right toe(s): Secondary | ICD-10-CM | POA: Diagnosis not present

## 2020-07-03 DIAGNOSIS — M25572 Pain in left ankle and joints of left foot: Secondary | ICD-10-CM

## 2020-07-03 NOTE — Progress Notes (Signed)
Subjective: 39 y.o. returns the office today for painful, elongated, thickened toenails which she cannot trim herself and for calluses. Denies any redness or drainage around the nails or calluses.  At the end of today's appointment no new complaints today. Denies any systemic complaints such as fevers, chills, nausea, vomiting.   Objective: AAO 3, NAD DP/PT pulses palpable, CRT less than 3 seconds  Nails hypertrophic, dystrophic, elongated, brittle, discolored 10. There is tenderness overlying the nails 1-5 bilaterally. There is no surrounding erythema or drainage along the nail sites. Hyperkeratotic tissue present bilateral submetatarsal 5 and hallux. Upon debridement there is no underlying ulceration, drainage or any signs of infection. No open lesions or pre-ulcerativeThere is currently no area pinpoint tenderness to the there is no area of pinpoint tenderness.  MMT 5/5.ateral lower extremities. No overlying edema, erythema, increased warmth. No pain with calf compression, swelling, warmth, erythema.  Assessment: Patient presents with symptomatic onychomycosis; hyperkeratotic lesions; ankle tendinitis  Plan: -Treatment options including alternatives, risks, complications were discussed -Nails sharply debrided 10 without complication/bleeding. -Hyperkeratotic tissue with sharp debrided x4 without any complications or bleeding -Referral to physical therapy for the ankle tendinitis, discomfort.  Continue with supportive shoe gear. -Discussed daily foot inspection. If there are any changes, to call the office immediately.  -Follow-up in 3 months or sooner if any problems are to arise. In the meantime, encouraged to call the office with any questions, concerns, changes symptoms.  Celesta Gentile, DPM

## 2020-07-05 ENCOUNTER — Ambulatory Visit: Payer: Medicare Other | Attending: Podiatry | Admitting: Physical Therapy

## 2020-07-05 ENCOUNTER — Encounter: Payer: Self-pay | Admitting: Physical Therapy

## 2020-07-05 ENCOUNTER — Other Ambulatory Visit: Payer: Self-pay

## 2020-07-05 DIAGNOSIS — M25572 Pain in left ankle and joints of left foot: Secondary | ICD-10-CM | POA: Diagnosis not present

## 2020-07-05 DIAGNOSIS — M6281 Muscle weakness (generalized): Secondary | ICD-10-CM | POA: Insufficient documentation

## 2020-07-05 DIAGNOSIS — M25571 Pain in right ankle and joints of right foot: Secondary | ICD-10-CM | POA: Insufficient documentation

## 2020-07-05 NOTE — Therapy (Signed)
[facility] [place], [place], [address] Phone: [phone]   Fax:  [phone]  Physical Therapy Evaluation  Patient Details  Name: Ashlee Hoffman MRN: [id number] Date of Birth: 09-03-1981 Referring Provider (PT): Celesta Gentile, DPM   Encounter Date: 07/05/2020   PT End of Session - 07/05/20 1902    Visit Number 1    Number of Visits 12    Date for PT Re-Evaluation 08/23/20    Authorization Type Progress note every 10th visit, KX modifer at 15th visit.    PT Start Time 1115    PT Stop Time 1158    PT Time Calculation (min) 43 min    Activity Tolerance Patient tolerated treatment well    Behavior During Therapy WFL for tasks assessed/performed           Past Medical History:  Diagnosis Date  . 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 Mental disability   . Shortness of breath    with exertion    Past Surgical History:  Procedure Laterality Date  . LUMBAR LAMINECTOMY/DECOMPRESSION MICRODISCECTOMY N/A 05/24/2013   Procedure: LUMBAR LAMINECTOMY/DECOMPRESSION MICRODISCECTOMY 1 LEVEL LEFT LUMBAR FIVE-SACARAL-ONE;  Surgeon: Charlie Pitter, MD;  Location: [facility];  Service: Neurosurgery;  Laterality: N/A;  left    There were no vitals filed for this visit.    Subjective Assessment - 07/05/20 1843    Subjective COVID-19 screening performed upon arrival. Patient arrives to physical therapy with reports of bilateral ankle pain R>L that began insidiously. Patient reports pain with walking and standing but is able to complete all ADLs independently. Patient reports pain at worst as 6/10 and at best 0/10 with rest. Patient's goals are to decrease pain, improve movement, improve strength and improve standing, and walking tolerance.    Patient is accompained by: Family member    Pertinent History mental disability, lumbar laminectomy 2014, wrist surgery 2020    Limitations Walking;Standing    How long can you walk comfortably? community  distances    Diagnostic tests n/a    Patient Stated Goals decrease pain    Currently in Pain? Yes    Pain Score 4     Pain Location Ankle    Pain Orientation Right;Left    Pain Descriptors / Indicators Aching;Sore    Pain Type Chronic pain    Pain Onset More than a month ago    Pain Frequency Constant    Aggravating Factors  standing, walking    Pain Relieving Factors lying down    Effect of Pain on Daily Activities pain with walking and standing              [facility] PT Assessment - 07/05/20 0001      Assessment   Medical Diagnosis Chronic pain of both ankles    Referring Provider (PT) Celesta Gentile, DPM    Onset Date/Surgical Date --   ongoing   Next MD Visit 3 months, January 2022    Prior Therapy yes, for knees      Precautions   Precautions None      Restrictions   Weight Bearing Restrictions No      Balance Screen   Has the patient fallen in the past 6 months No    Has the patient had a decrease in activity level because of a fear of falling?  No    Is the patient reluctant to leave their home because of a fear of falling?  No  Home Environment   Living Environment Private residence    Living Arrangements Parent    Type of [place] Access Stairs to enter    Entrance Stairs-Number of Steps 4    Entrance Stairs-Rails Left    Home Layout Two level    Alternate Level Stairs-Number of Steps 18 with landing    Alternate Level Stairs-Rails Left      Prior Function   Level of Independence Independent      ROM / Strength   AROM / PROM / Strength AROM;PROM;Strength      AROM   AROM Assessment Site Ankle    Right/Left Ankle Right;Left    Right Ankle Dorsiflexion -12    Right Ankle Plantar Flexion 40    Right Ankle Inversion 40    Right Ankle Eversion 8    Left Ankle Dorsiflexion 2    Left Ankle Plantar Flexion 42    Left Ankle Inversion 38    Left Ankle Eversion 8      PROM   Overall PROM  Within functional limits for tasks performed     PROM Assessment Site Ankle    Right/Left Ankle Right;Left    Right Ankle Dorsiflexion 0    Right Ankle Plantar Flexion 42    Right Ankle Inversion 42    Right Ankle Eversion 10    Left Ankle Dorsiflexion 8    Left Ankle Plantar Flexion 44    Left Ankle Inversion 40    Left Ankle Eversion 12      Strength   Overall Strength Deficits    Strength Assessment Site Ankle    Right/Left Ankle Left;Right    Right Ankle Dorsiflexion 4-/5    Right Ankle Plantar Flexion 3/5    Right Ankle Inversion 4-/5    Right Ankle Eversion 4-/5    Left Ankle Dorsiflexion 4+/5    Left Ankle Plantar Flexion 3/5    Left Ankle Inversion 4-/5    Left Ankle Eversion 4-/5      Palpation   Palpation comment minimal tenderness to palpation to bilateral ankles, achilles tendons and calves      Ambulation/Gait   Gait Pattern Step-through pattern;Decreased stride length;Wide base of support;Abducted - left;Abducted- right                      Objective measurements completed on examination: See above findings.               PT Education - 07/05/20 1900    Education Details seated HR/TR, IN/EV, gastroc stretch    Person(s) Educated Patient;Parent(s)    Methods Explanation;Demonstration;Handout    Comprehension Verbalized understanding;Returned demonstration               PT Long Term Goals - 07/05/20 1903      PT LONG TERM GOAL #1   Title Patient will be independent with HEP    Time 6    Period Weeks    Status New      PT LONG TERM GOAL #2   Title Patient will demonstrate 4/5 or greater bilateral ankle MMT to improve stability during functional tasks.    Time 6    Period Weeks    Status New      PT LONG TERM GOAL #3   Title Patient will report ability to walk community distances with bilateral ankle pain less than or equal to 2-3/10 on Faces Pain Scale.    Time 6  Period Weeks    Status New                  Plan - 07/05/20 1918    Clinical Impression  Statement Patient is a 39 year old female who presents to physical therapy with her mother with chronic bilateral ankle pain R>L, decreased R ankle ROM, and decreased bilateral ankle MMT. Patient denies tenderness to palpation throughout bilateral feet and ankles. Patient, patient's parent, and PT disucussed POC and HEP to which they reported understanding. Patient would beneift from skilled physical therapy to address deficits and patient's goals.    Personal Factors and Comorbidities Comorbidity 1    Comorbidities mental disability, lumbar laminectomy 2014, wrist surgery 2020    Examination-Activity Limitations Stand;Locomotion Level    Stability/Clinical Decision Making Stable/Uncomplicated    Clinical Decision Making Low    Rehab Potential Fair    PT Frequency 2x / week    PT Duration 6 weeks    PT Treatment/Interventions ADLs/Self Care Home Management;Cryotherapy;Electrical Stimulation;Moist Heat;Ultrasound;Gait training;Stair training;Functional mobility training;Therapeutic activities;Therapeutic exercise;Balance training;Neuromuscular re-education;Manual techniques;Passive range of motion;Patient/family education;Vasopneumatic Device;Taping;Iontophoresis 4mg /ml Dexamethasone    PT Next Visit Plan nustep or bike, ankle ROM, ankle strengthening, balance activities per tolerance, modalities PRN for pain relief.    PT Home Exercise Plan see patient education section    Consulted and Agree with Plan of Care Patient;Family member/caregiver           Patient will benefit from skilled therapeutic intervention in order to improve the following deficits and impairments:  Abnormal gait, Difficulty walking, Decreased range of motion, Decreased balance, Decreased strength, Pain, Decreased activity tolerance  Visit Diagnosis: Pain in right ankle and joints of right foot - Plan: PT plan of care cert/re-cert  Pain in left ankle and joints of left foot - Plan: PT plan of care cert/re-cert  Muscle  weakness (generalized) - Plan: PT plan of care cert/re-cert     Problem List Patient Active Problem List   Diagnosis Date Noted  . Wrist joint effusion 05/01/2018  . Morbid obesity ([place]) 07/04/2015  . HNP (herniated nucleus pulposus), lumbar 05/24/2013  . Mentally disabled 01/21/2013    Gabriela Eves, PT, DPT 07/05/2020, 7:31 PM  [facility] [place], [place], [address] Phone: [phone]   Fax:  [phone]  Name: Rosalina Dingwall MRN: [id number] Date of Birth: [birth year]

## 2020-07-10 ENCOUNTER — Ambulatory Visit: Payer: Medicare Other | Admitting: Physical Therapy

## 2020-07-10 ENCOUNTER — Other Ambulatory Visit: Payer: Self-pay

## 2020-07-10 ENCOUNTER — Encounter: Payer: Self-pay | Admitting: Physical Therapy

## 2020-07-10 DIAGNOSIS — M25571 Pain in right ankle and joints of right foot: Secondary | ICD-10-CM | POA: Diagnosis not present

## 2020-07-10 DIAGNOSIS — M25572 Pain in left ankle and joints of left foot: Secondary | ICD-10-CM

## 2020-07-10 DIAGNOSIS — M6281 Muscle weakness (generalized): Secondary | ICD-10-CM

## 2020-07-10 NOTE — Therapy (Signed)
[facility] [place], [place], [address] Phone: [phone]   Fax:  [phone]  Physical Therapy Treatment  Patient Details  Name: Ashlee Hoffman MRN: [id number] Date of Birth: Oct 23, 1980 Referring Provider (PT): Celesta Gentile, DPM   Encounter Date: 07/10/2020   PT End of Session - 07/10/20 1127    Visit Number 2    Number of Visits 12    Date for PT Re-Evaluation 08/23/20    Authorization Type Progress note every 10th visit, KX modifer at 15th visit.    PT Start Time 1117    PT Stop Time 1158    PT Time Calculation (min) 41 min    Activity Tolerance Patient tolerated treatment well    Behavior During Therapy WFL for tasks assessed/performed           Past Medical History:  Diagnosis Date  . 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 Mental disability   . Shortness of breath    with exertion    Past Surgical History:  Procedure Laterality Date  . LUMBAR LAMINECTOMY/DECOMPRESSION MICRODISCECTOMY N/A 05/24/2013   Procedure: LUMBAR LAMINECTOMY/DECOMPRESSION MICRODISCECTOMY 1 LEVEL LEFT LUMBAR FIVE-SACARAL-ONE;  Surgeon: Charlie Pitter, MD;  Location: [facility];  Service: Neurosurgery;  Laterality: N/A;  left    There were no vitals filed for this visit.   Subjective Assessment - 07/10/20 1118    Subjective COVID 19 screening performed on patient upon arrival. Patient states that R ankle is worse but she has a history of R knee pain as well.    Pertinent History mental disability, lumbar laminectomy 2014, wrist surgery 2020    Limitations Walking;Standing    How long can you walk comfortably? community distances    Diagnostic tests n/a    Patient Stated Goals decrease pain    Currently in Pain? Yes    Pain Score 6     Pain Location Ankle    Pain Orientation Right    Pain Descriptors / Indicators Sore    Pain Type Chronic pain    Pain Onset More than a month ago    Pain Frequency Constant              [facility] PT  Assessment - 07/10/20 0001      Assessment   Medical Diagnosis Chronic pain of both ankles    Referring Provider (PT) Celesta Gentile, DPM    Next MD Visit 3 months, January 2022    Prior Therapy yes, for knees      Precautions   Precautions None      Restrictions   Weight Bearing Restrictions No                         [facility] Adult PT Treatment/Exercise - 07/10/20 0001      Exercises   Exercises Ankle      Ankle Exercises: Aerobic   Nustep L2 x10 min      Ankle Exercises: Standing   Rocker Board 2 minutes    Heel Raises Both;20 reps    Toe Raise 20 reps      Ankle Exercises: Seated   ABC's 1 rep   B ankle   Ankle Circles/Pumps Strengthening;Both;20 reps    Ankle Circles/Pumps Limitations B ankle    Towel Crunch Other (comment)   towel pickup for B ankle    Towel Inversion/Eversion 5 reps    Marble Pickup B foot x10 marbles each    Other Seated  Ankle Exercises B ankle isolator 1# DF/PF, Inv/Ev, circles x20 reps each                       PT Long Term Goals - 07/05/20 1903      PT LONG TERM GOAL #1   Title Patient will be independent with HEP    Time 6    Period Weeks    Status New      PT LONG TERM GOAL #2   Title Patient will demonstrate 4/5 or greater bilateral ankle MMT to improve stability during functional tasks.    Time 6    Period Weeks    Status New      PT LONG TERM GOAL #3   Title Patient will report ability to walk community distances with bilateral ankle pain less than or equal to 2-3/10 on Faces Pain Scale.    Time 6    Period Weeks    Status New                 Plan - 07/10/20 1206    Clinical Impression Statement Patient presented in clinic with reports of B ankle soreness and plantar surface soreness as well. Patient guided through various strengthening exercises with and without resistance. Arch strengthening also completed per reports of plantar soreness. Patient unable to indicate what caused pain or  what causes exacerbations. Patient denies any edema in B ankles.    Personal Factors and Comorbidities Comorbidity 1    Comorbidities mental disability, lumbar laminectomy 2014, wrist surgery 2020    Examination-Activity Limitations Stand;Locomotion Level    Stability/Clinical Decision Making Stable/Uncomplicated    Rehab Potential Fair    PT Frequency 2x / week    PT Duration 6 weeks    PT Treatment/Interventions ADLs/Self Care Home Management;Cryotherapy;Electrical Stimulation;Moist Heat;Ultrasound;Gait training;Stair training;Functional mobility training;Therapeutic activities;Therapeutic exercise;Balance training;Neuromuscular re-education;Manual techniques;Passive range of motion;Patient/family education;Vasopneumatic Device;Taping;Iontophoresis 4mg /ml Dexamethasone    PT Next Visit Plan nustep or bike, ankle ROM, ankle strengthening, balance activities per tolerance, modalities PRN for pain relief.    PT Home Exercise Plan see patient education section    Consulted and Agree with Plan of Care Patient           Patient will benefit from skilled therapeutic intervention in order to improve the following deficits and impairments:  Abnormal gait, Difficulty walking, Decreased range of motion, Decreased balance, Decreased strength, Pain, Decreased activity tolerance  Visit Diagnosis: Pain in right ankle and joints of right foot  Pain in left ankle and joints of left foot  Muscle weakness (generalized)     Problem List Patient Active Problem List   Diagnosis Date Noted  . Wrist joint effusion 05/01/2018  . Morbid obesity ([place]) 07/04/2015  . HNP (herniated nucleus pulposus), lumbar 05/24/2013  . Mentally disabled 01/21/2013    Standley Brooking, PTA 07/10/2020, 12:09 PM  [facility] [place], [place], [address] Phone: [phone]   Fax:  [phone]  Name: Ashlee Hoffman MRN: [id number] Date of Birth: Jun 18, 1981

## 2020-07-12 ENCOUNTER — Other Ambulatory Visit: Payer: Self-pay

## 2020-07-12 ENCOUNTER — Encounter: Payer: Self-pay | Admitting: Physical Therapy

## 2020-07-12 ENCOUNTER — Ambulatory Visit: Payer: Medicare Other | Admitting: Physical Therapy

## 2020-07-12 DIAGNOSIS — M25571 Pain in right ankle and joints of right foot: Secondary | ICD-10-CM

## 2020-07-12 DIAGNOSIS — M6281 Muscle weakness (generalized): Secondary | ICD-10-CM

## 2020-07-12 DIAGNOSIS — M25572 Pain in left ankle and joints of left foot: Secondary | ICD-10-CM | POA: Diagnosis not present

## 2020-07-12 NOTE — Therapy (Signed)
[facility] [place], [place], [address] Phone: [phone]   Fax:  [phone]  Physical Therapy Treatment  Patient Details  Name: Ashlee Hoffman MRN: [id number] Date of Birth: 20-Jul-1981 Referring Provider (PT): Celesta Gentile, DPM   Encounter Date: 07/12/2020   PT End of Session - 07/12/20 1219    Visit Number 3    Number of Visits 12    Date for PT Re-Evaluation 08/23/20    Authorization Type Progress note every 10th visit, KX modifer at 15th visit.    PT Start Time 1115    PT Stop Time 1156    PT Time Calculation (min) 41 min    Activity Tolerance Patient tolerated treatment well    Behavior During Therapy WFL for tasks assessed/performed           Past Medical History:  Diagnosis Date  . 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 Mental disability   . Shortness of breath    with exertion    Past Surgical History:  Procedure Laterality Date  . LUMBAR LAMINECTOMY/DECOMPRESSION MICRODISCECTOMY N/A 05/24/2013   Procedure: LUMBAR LAMINECTOMY/DECOMPRESSION MICRODISCECTOMY 1 LEVEL LEFT LUMBAR FIVE-SACARAL-ONE;  Surgeon: Charlie Pitter, MD;  Location: [facility];  Service: Neurosurgery;  Laterality: N/A;  left    There were no vitals filed for this visit.   Subjective Assessment - 07/12/20 1231    Subjective COVID 19 screening performed on patient upon arrival. Patient reports feeling about a 5/10 in both ankles today.    Pertinent History mental disability, lumbar laminectomy 2014, wrist surgery 2020    Limitations Walking;Standing    How long can you walk comfortably? community distances    Diagnostic tests n/a    Patient Stated Goals decrease pain    Currently in Pain? Yes    Pain Score 5     Pain Location Ankle    Pain Orientation Right;Left    Pain Descriptors / Indicators Sore    Pain Type Chronic pain    Pain Onset More than a month ago    Pain Frequency Constant              [facility] PT Assessment - 07/12/20  0001      Assessment   Medical Diagnosis Chronic pain of both ankles    Referring Provider (PT) Celesta Gentile, DPM    Next MD Visit 3 months, January 2022    Prior Therapy yes, for knees                         [facility] Adult PT Treatment/Exercise - 07/12/20 0001      Exercises   Exercises Ankle      Ankle Exercises: Aerobic   Nustep L2 x12 min      Ankle Exercises: Standing   Rocker Board 3 minutes    Heel Raises Both;20 reps    Toe Raise 20 reps    Balance Beam x5  on balance with UE support      Ankle Exercises: Seated   Heel Slides Right;Left;20 reps    Other Seated Ankle Exercises Dynadisc circles x20  CW and CCW bilaterally      Ankle Exercises: Supine   Other Supine Ankle Exercises bilateral 4 way ankle yellow theraband x20 each                       PT Long Term Goals - 07/05/20 1903  PT LONG TERM GOAL #1   Title Patient will be independent with HEP    Time 6    Period Weeks    Status New      PT LONG TERM GOAL #2   Title Patient will demonstrate 4/5 or greater bilateral ankle MMT to improve stability during functional tasks.    Time 6    Period Weeks    Status New      PT LONG TERM GOAL #3   Title Patient will report ability to walk community distances with bilateral ankle pain less than or equal to 2-3/10 on Faces Pain Scale.    Time 6    Period Weeks    Status New                 Plan - 07/12/20 1219    Clinical Impression Statement Patient responsed well to therapy session but with slight increase of soreness with HR. Patient guided through TEs with demonstration, tactile and verbal cuing. Patient required tactile cue to prevent hip IR and ER with bilateral resisted ankle inversion/eversion.    Personal Factors and Comorbidities Comorbidity 1    Comorbidities mental disability, lumbar laminectomy 2014, wrist surgery 2020    Examination-Activity Limitations Stand;Locomotion Level    Stability/Clinical Decision  Making Stable/Uncomplicated    Clinical Decision Making Low    Rehab Potential Fair    PT Frequency 2x / week    PT Duration 6 weeks    PT Treatment/Interventions ADLs/Self Care Home Management;Cryotherapy;Electrical Stimulation;Moist Heat;Ultrasound;Gait training;Stair training;Functional mobility training;Therapeutic activities;Therapeutic exercise;Balance training;Neuromuscular re-education;Manual techniques;Passive range of motion;Patient/family education;Vasopneumatic Device;Taping;Iontophoresis 4mg /ml Dexamethasone    PT Next Visit Plan nustep or bike, ankle ROM, ankle strengthening, balance activities per tolerance, modalities PRN for pain relief.    PT Home Exercise Plan see patient education section    Consulted and Agree with Plan of Care Patient           Patient will benefit from skilled therapeutic intervention in order to improve the following deficits and impairments:  Abnormal gait, Difficulty walking, Decreased range of motion, Decreased balance, Decreased strength, Pain, Decreased activity tolerance  Visit Diagnosis: Pain in right ankle and joints of right foot  Pain in left ankle and joints of left foot  Muscle weakness (generalized)     Problem List Patient Active Problem List   Diagnosis Date Noted  . Wrist joint effusion 05/01/2018  . Morbid obesity ([place]) 07/04/2015  . HNP (herniated nucleus pulposus), lumbar 05/24/2013  . Mentally disabled 01/21/2013    Gabriela Eves 07/12/2020, 12:35 PM  [facility] [place], [place], [address] Phone: [phone]   Fax:  [phone]  Name: Karesa Maultsby MRN: [id number] Date of Birth: 1980/11/04

## 2020-07-17 ENCOUNTER — Other Ambulatory Visit: Payer: Self-pay

## 2020-07-17 ENCOUNTER — Ambulatory Visit: Payer: Medicare Other | Attending: Podiatry | Admitting: Physical Therapy

## 2020-07-17 ENCOUNTER — Encounter: Payer: Self-pay | Admitting: Physical Therapy

## 2020-07-17 DIAGNOSIS — M25561 Pain in right knee: Secondary | ICD-10-CM | POA: Insufficient documentation

## 2020-07-17 DIAGNOSIS — M25571 Pain in right ankle and joints of right foot: Secondary | ICD-10-CM | POA: Insufficient documentation

## 2020-07-17 DIAGNOSIS — M6281 Muscle weakness (generalized): Secondary | ICD-10-CM | POA: Diagnosis not present

## 2020-07-17 DIAGNOSIS — M25572 Pain in left ankle and joints of left foot: Secondary | ICD-10-CM | POA: Insufficient documentation

## 2020-07-17 DIAGNOSIS — G8929 Other chronic pain: Secondary | ICD-10-CM | POA: Insufficient documentation

## 2020-07-17 NOTE — Therapy (Signed)
[facility] [place], [place], [address] Phone: [phone]   Fax:  [phone]  Physical Therapy Treatment  Patient Details  Name: Ashlee Hoffman MRN: [id number] Date of Birth: 1980/11/09 Referring Provider (PT): Celesta Gentile, DPM   Encounter Date: 07/17/2020   PT End of Session - 07/17/20 1210    Visit Number 4    Number of Visits 12    Date for PT Re-Evaluation 08/23/20    Authorization Type Progress note every 10th visit, KX modifer at 15th visit.    PT Start Time 1115    PT Stop Time 1157    PT Time Calculation (min) 42 min    Activity Tolerance Patient tolerated treatment well    Behavior During Therapy WFL for tasks assessed/performed           Past Medical History:  Diagnosis Date  . 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 Mental disability   . Shortness of breath    with exertion    Past Surgical History:  Procedure Laterality Date  . LUMBAR LAMINECTOMY/DECOMPRESSION MICRODISCECTOMY N/A 05/24/2013   Procedure: LUMBAR LAMINECTOMY/DECOMPRESSION MICRODISCECTOMY 1 LEVEL LEFT LUMBAR FIVE-SACARAL-ONE;  Surgeon: Charlie Pitter, MD;  Location: [facility];  Service: Neurosurgery;  Laterality: N/A;  left    There were no vitals filed for this visit.   Subjective Assessment - 07/17/20 1111    Subjective COVID 19 screening performed on patient upon arrival. Patient reports feeling about a 5/10 in both ankles today.    Pertinent History mental disability, lumbar laminectomy 2014, wrist surgery 2020    Limitations Walking;Standing    How long can you walk comfortably? community distances    Diagnostic tests n/a    Patient Stated Goals decrease pain    Currently in Pain? Yes    Pain Score 5     Pain Location Ankle    Pain Orientation Right    Pain Descriptors / Indicators Sore    Pain Type Chronic pain    Pain Onset More than a month ago    Pain Frequency Constant              [facility] PT Assessment - 07/17/20 0001        Assessment   Medical Diagnosis Chronic pain of both ankles    Referring Provider (PT) Celesta Gentile, DPM    Next MD Visit 3 months, January 2022    Prior Therapy yes, for knees      Precautions   Precautions None                         [facility] Adult PT Treatment/Exercise - 07/17/20 0001      Exercises   Exercises Ankle;Knee/Hip      Knee/Hip Exercises: Standing   Lateral Step Up Right;2 sets;10 reps;Hand Hold: 2;Step Height: 6"    Forward Step Up Right;2 sets;10 reps;Hand Hold: 2;Step Height: 6"      Ankle Exercises: Aerobic   Nustep L4 x15 min      Ankle Exercises: Standing   Rocker Board 3 minutes    Heel Raises Both;20 reps    Toe Raise 20 reps    Other Standing Ankle Exercises R ankle dynadisc PF/DF, Inv/Ev, circles x20 reps each      Ankle Exercises: Seated   Ankle Circles/Pumps Strengthening;Both;20 reps;10 reps   ankle isolater 1.5#   Other Seated Ankle Exercises Seated R ankle red theraband DF, Inv, Ev x20 reps each  PT Long Term Goals - 07/05/20 1903      PT LONG TERM GOAL #1   Title Patient will be independent with HEP    Time 6    Period Weeks    Status New      PT LONG TERM GOAL #2   Title Patient will demonstrate 4/5 or greater bilateral ankle MMT to improve stability during functional tasks.    Time 6    Period Weeks    Status New      PT LONG TERM GOAL #3   Title Patient will report ability to walk community distances with bilateral ankle pain less than or equal to 2-3/10 on Faces Pain Scale.    Time 6    Period Weeks    Status New                 Plan - 07/17/20 1215    Clinical Impression Statement Patient presented in clinic with reports of more R ankle soreness today. Patient requiring mod-max multimodal cueing especially demonstration for exercise completion. Patient did not report any increased ankle pain during treatment. Tactile and VCs provided to avoid hip/knee compensation  for therex.    Personal Factors and Comorbidities Comorbidity 1    Comorbidities mental disability, lumbar laminectomy 2014, wrist surgery 2020    Examination-Activity Limitations Stand;Locomotion Level    Stability/Clinical Decision Making Stable/Uncomplicated    Rehab Potential Fair    PT Frequency 2x / week    PT Duration 6 weeks    PT Treatment/Interventions ADLs/Self Care Home Management;Cryotherapy;Electrical Stimulation;Moist Heat;Ultrasound;Gait training;Stair training;Functional mobility training;Therapeutic activities;Therapeutic exercise;Balance training;Neuromuscular re-education;Manual techniques;Passive range of motion;Patient/family education;Vasopneumatic Device;Taping;Iontophoresis 4mg /ml Dexamethasone    PT Next Visit Plan nustep or bike, ankle ROM, ankle strengthening, balance activities per tolerance, modalities PRN for pain relief.    PT Home Exercise Plan see patient education section    Consulted and Agree with Plan of Care Patient           Patient will benefit from skilled therapeutic intervention in order to improve the following deficits and impairments:  Abnormal gait, Difficulty walking, Decreased range of motion, Decreased balance, Decreased strength, Pain, Decreased activity tolerance  Visit Diagnosis: Pain in right ankle and joints of right foot  Pain in left ankle and joints of left foot  Muscle weakness (generalized)     Problem List Patient Active Problem List   Diagnosis Date Noted  . Wrist joint effusion 05/01/2018  . Morbid obesity ([place]) 07/04/2015  . HNP (herniated nucleus pulposus), lumbar 05/24/2013  . Mentally disabled 01/21/2013    Standley Brooking, PTA 07/17/2020, 12:17 PM  [facility] [place], [place], [address] Phone: [phone]   Fax:  [phone]  Name: Ashlee Hoffman MRN: [id number] Date of Birth: [birth year]

## 2020-07-18 DIAGNOSIS — M25531 Pain in right wrist: Secondary | ICD-10-CM | POA: Diagnosis not present

## 2020-07-19 ENCOUNTER — Other Ambulatory Visit: Payer: Self-pay

## 2020-07-19 ENCOUNTER — Ambulatory Visit: Payer: Medicare Other | Admitting: Physical Therapy

## 2020-07-19 ENCOUNTER — Encounter: Payer: Self-pay | Admitting: Physical Therapy

## 2020-07-19 DIAGNOSIS — M25571 Pain in right ankle and joints of right foot: Secondary | ICD-10-CM | POA: Diagnosis not present

## 2020-07-19 DIAGNOSIS — M6281 Muscle weakness (generalized): Secondary | ICD-10-CM | POA: Diagnosis not present

## 2020-07-19 DIAGNOSIS — G8929 Other chronic pain: Secondary | ICD-10-CM | POA: Diagnosis not present

## 2020-07-19 DIAGNOSIS — M25561 Pain in right knee: Secondary | ICD-10-CM | POA: Diagnosis not present

## 2020-07-19 DIAGNOSIS — M25572 Pain in left ankle and joints of left foot: Secondary | ICD-10-CM

## 2020-07-19 NOTE — Therapy (Signed)
[facility] [place], [place], [address] Phone: [phone]   Fax:  [phone]  Physical Therapy Treatment  Patient Details  Name: Ashlee Hoffman MRN: [id number] Date of Birth: November 02, 1980 Referring Provider (PT): Celesta Gentile, DPM   Encounter Date: 07/19/2020   PT End of Session - 07/19/20 1123    Visit Number 5    Number of Visits 12    Date for PT Re-Evaluation 08/23/20    Authorization Type Progress note every 10th visit, KX modifer at 15th visit.    PT Start Time 1115    PT Stop Time 1157    PT Time Calculation (min) 42 min    Activity Tolerance Patient tolerated treatment well    Behavior During Therapy WFL for tasks assessed/performed           Past Medical History:  Diagnosis Date  . 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 Mental disability   . Shortness of breath    with exertion    Past Surgical History:  Procedure Laterality Date  . LUMBAR LAMINECTOMY/DECOMPRESSION MICRODISCECTOMY N/A 05/24/2013   Procedure: LUMBAR LAMINECTOMY/DECOMPRESSION MICRODISCECTOMY 1 LEVEL LEFT LUMBAR FIVE-SACARAL-ONE;  Surgeon: Charlie Pitter, MD;  Location: [facility];  Service: Neurosurgery;  Laterality: N/A;  left    There were no vitals filed for this visit.   Subjective Assessment - 07/19/20 1122    Subjective COVID 19 screening performed on patient upon arrival. Patient reports more pain in R ankle than left today, 7/10.    Pertinent History mental disability, lumbar laminectomy 2014, wrist surgery 2020    Limitations Walking;Standing    How long can you walk comfortably? community distances    Diagnostic tests n/a    Patient Stated Goals decrease pain    Currently in Pain? Yes    Pain Score 7     Pain Location Ankle    Pain Orientation Right    Pain Descriptors / Indicators Sore    Pain Type Chronic pain    Pain Onset More than a month ago    Pain Frequency Constant              [facility] PT Assessment - 07/19/20 0001       Assessment   Medical Diagnosis Chronic pain of both ankles    Referring Provider (PT) Celesta Gentile, DPM    Next MD Visit 3 months, January 2022    Prior Therapy yes, for knees      Precautions   Precautions None                         [facility] Adult PT Treatment/Exercise - 07/19/20 0001      Exercises   Exercises Ankle      Ankle Exercises: Aerobic   Nustep L4 x15 min      Ankle Exercises: Standing   Rocker Board 3 minutes    Heel Raises Both;20 reps    Toe Raise 20 reps    Balance Beam x 3 mins lateral stepping on balance with UE support      Ankle Exercises: Seated   Ankle Circles/Pumps Strengthening;Both;20 reps;10 reps   ankle isolator 1.5#   Other Seated Ankle Exercises Seated R ankle red theraband DF, Inv, Ev x20 reps each                       PT Long Term Goals - 07/19/20 1224  PT LONG TERM GOAL #1   Title Patient will be independent with HEP    Time 6    Period Weeks    Status On-going      PT LONG TERM GOAL #2   Title Patient will demonstrate 4/5 or greater bilateral ankle MMT to improve stability during functional tasks.    Time 6    Period Weeks    Status On-going      PT LONG TERM GOAL #3   Title Patient will report ability to walk community distances with bilateral ankle pain less than or equal to 2-3/10 on Faces Pain Scale.    Time 6    Period Weeks    Status On-going                 Plan - 07/19/20 1214    Clinical Impression Statement Patient arrived with moderate ankle pain, particularly in the dorsum of the foot. Patient reported slight decrease of pain throughout session. Patient requires demonstration and tactile cuing to prevent compensatory motions with fair carryover for remainder of exercise.    Personal Factors and Comorbidities Comorbidity 1    Comorbidities mental disability, lumbar laminectomy 2014, wrist surgery 2020    Examination-Activity Limitations Stand;Locomotion Level     Stability/Clinical Decision Making Stable/Uncomplicated    Clinical Decision Making Low    Rehab Potential Fair    PT Frequency 2x / week    PT Duration 6 weeks    PT Treatment/Interventions ADLs/Self Care Home Management;Cryotherapy;Electrical Stimulation;Moist Heat;Ultrasound;Gait training;Stair training;Functional mobility training;Therapeutic activities;Therapeutic exercise;Balance training;Neuromuscular re-education;Manual techniques;Passive range of motion;Patient/family education;Vasopneumatic Device;Taping;Iontophoresis 4mg /ml Dexamethasone    PT Next Visit Plan nustep or bike, ankle ROM, ankle strengthening, balance activities per tolerance, modalities PRN for pain relief.    PT Home Exercise Plan see patient education section    Consulted and Agree with Plan of Care Patient           Patient will benefit from skilled therapeutic intervention in order to improve the following deficits and impairments:  Abnormal gait, Difficulty walking, Decreased range of motion, Decreased balance, Decreased strength, Pain, Decreased activity tolerance  Visit Diagnosis: Pain in right ankle and joints of right foot  Pain in left ankle and joints of left foot  Muscle weakness (generalized)     Problem List Patient Active Problem List   Diagnosis Date Noted  . Wrist joint effusion 05/01/2018  . Morbid obesity ([place]) 07/04/2015  . HNP (herniated nucleus pulposus), lumbar 05/24/2013  . Mentally disabled 01/21/2013    Gabriela Eves, PT, DPT 07/19/2020, 12:29 PM  [facility] [place], [place], [address] Phone: [phone]   Fax:  [phone]  Name: July Linam MRN: [id number] Date of Birth: 1981-02-10

## 2020-07-24 ENCOUNTER — Other Ambulatory Visit: Payer: Self-pay

## 2020-07-24 ENCOUNTER — Encounter: Payer: Self-pay | Admitting: Physical Therapy

## 2020-07-24 ENCOUNTER — Ambulatory Visit: Payer: Medicare Other | Admitting: Physical Therapy

## 2020-07-24 DIAGNOSIS — G8929 Other chronic pain: Secondary | ICD-10-CM | POA: Diagnosis not present

## 2020-07-24 DIAGNOSIS — M25572 Pain in left ankle and joints of left foot: Secondary | ICD-10-CM | POA: Diagnosis not present

## 2020-07-24 DIAGNOSIS — M25571 Pain in right ankle and joints of right foot: Secondary | ICD-10-CM

## 2020-07-24 DIAGNOSIS — M6281 Muscle weakness (generalized): Secondary | ICD-10-CM

## 2020-07-24 DIAGNOSIS — M25561 Pain in right knee: Secondary | ICD-10-CM | POA: Diagnosis not present

## 2020-07-24 NOTE — Therapy (Signed)
[facility] [place], [place], [address] Phone: [phone]   Fax:  [phone]  Physical Therapy Treatment  Patient Details  Name: Ashlee Hoffman MRN: [id number] Date of Birth: 1981-04-01 Referring Provider (PT): Celesta Gentile, DPM   Encounter Date: 07/24/2020   PT End of Session - 07/24/20 1518    Visit Number 6    Number of Visits 12    Date for PT Re-Evaluation 08/23/20    Authorization Type Progress note every 10th visit, KX modifer at 15th visit.    PT Start Time 1432    PT Stop Time 1512    PT Time Calculation (min) 40 min    Activity Tolerance Patient tolerated treatment well    Behavior During Therapy WFL for tasks assessed/performed           Past Medical History:  Diagnosis Date  . 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 Mental disability   . Shortness of breath    with exertion    Past Surgical History:  Procedure Laterality Date  . LUMBAR LAMINECTOMY/DECOMPRESSION MICRODISCECTOMY N/A 05/24/2013   Procedure: LUMBAR LAMINECTOMY/DECOMPRESSION MICRODISCECTOMY 1 LEVEL LEFT LUMBAR FIVE-SACARAL-ONE;  Surgeon: Charlie Pitter, MD;  Location: [facility];  Service: Neurosurgery;  Laterality: N/A;  left    There were no vitals filed for this visit.   Subjective Assessment - 07/24/20 1517    Subjective COVID 19 screening performed on patient upon arrival. Patient reports more pain in R ankle than left today at 4/10.    Pertinent History mental disability, lumbar laminectomy 2014, wrist surgery 2020    Limitations Walking;Standing    How long can you walk comfortably? community distances    Diagnostic tests n/a    Patient Stated Goals decrease pain    Currently in Pain? Yes    Pain Score 4     Pain Location Ankle    Pain Orientation Right    Pain Descriptors / Indicators Discomfort    Pain Type Chronic pain    Pain Onset More than a month ago    Pain Frequency Intermittent              [facility] PT Assessment -  07/24/20 0001      Assessment   Medical Diagnosis Chronic pain of both ankles    Referring Provider (PT) Celesta Gentile, DPM    Next MD Visit 3 months, January 2022    Prior Therapy yes, for knees      Precautions   Precautions None                         [facility] Adult PT Treatment/Exercise - 07/24/20 0001      Ankle Exercises: Aerobic   Nustep L4 x12 min      Ankle Exercises: Standing   Rocker Board 5 minutes    Heel Raises Both;20 reps    Toe Raise 20 reps    Balance Beam x5 RT sidestepping    Other Standing Ankle Exercises R forward lunge on 8" step for ROM x20 rep      Ankle Exercises: Seated   Ankle Circles/Pumps Strengthening;Both;20 reps;10 reps    Ankle Circles/Pumps Limitations 1.5#    Other Seated Ankle Exercises Seated B ankle isolator 4D strengthening x30 reps each    Other Seated Ankle Exercises R ankle prostretch x3 min  PT Long Term Goals - 07/19/20 1224      PT LONG TERM GOAL #1   Title Patient will be independent with HEP    Time 6    Period Weeks    Status On-going      PT LONG TERM GOAL #2   Title Patient will demonstrate 4/5 or greater bilateral ankle MMT to improve stability during functional tasks.    Time 6    Period Weeks    Status On-going      PT LONG TERM GOAL #3   Title Patient will report ability to walk community distances with bilateral ankle pain less than or equal to 2-3/10 on Faces Pain Scale.    Time 6    Period Weeks    Status On-going                 Plan - 07/24/20 1525    Clinical Impression Statement Patient presented in clinic with continued reports of greater R ankle pain than L ankle. Patient initally rated R ankle pain as 4/10 upon arrival. Patient guided through strengthening of B ankle but particular focus on R ankle since greater limitation reported by patient. Patient only reported ankle fatigue in R ankle during therex session. Fair technique carryover noted  during treatment but requires multimodal cueing throughout treatment for corrections.    Personal Factors and Comorbidities Comorbidity 1    Comorbidities mental disability, lumbar laminectomy 2014, wrist surgery 2020    Examination-Activity Limitations Stand;Locomotion Level    Stability/Clinical Decision Making Stable/Uncomplicated    Rehab Potential Fair    PT Frequency 2x / week    PT Duration 6 weeks    PT Treatment/Interventions ADLs/Self Care Home Management;Cryotherapy;Electrical Stimulation;Moist Heat;Ultrasound;Gait training;Stair training;Functional mobility training;Therapeutic activities;Therapeutic exercise;Balance training;Neuromuscular re-education;Manual techniques;Passive range of motion;Patient/family education;Vasopneumatic Device;Taping;Iontophoresis 4mg /ml Dexamethasone    PT Next Visit Plan nustep or bike, ankle ROM, ankle strengthening, balance activities per tolerance, modalities PRN for pain relief.    PT Home Exercise Plan see patient education section    Consulted and Agree with Plan of Care Patient           Patient will benefit from skilled therapeutic intervention in order to improve the following deficits and impairments:  Abnormal gait, Difficulty walking, Decreased range of motion, Decreased balance, Decreased strength, Pain, Decreased activity tolerance  Visit Diagnosis: Pain in right ankle and joints of right foot  Pain in left ankle and joints of left foot  Muscle weakness (generalized)     Problem List Patient Active Problem List   Diagnosis Date Noted  . Wrist joint effusion 05/01/2018  . Morbid obesity ([place]) 07/04/2015  . HNP (herniated nucleus pulposus), lumbar 05/24/2013  . Mentally disabled 01/21/2013    Standley Brooking, PTA 07/24/2020, 3:29 PM  [facility] [place], [place], [address] Phone: [phone]   Fax:  [phone]  Name: Ashlee Hoffman MRN: [id number] Date of Birth:  [birth year]

## 2020-07-26 ENCOUNTER — Encounter: Payer: Medicare Other | Admitting: Physical Therapy

## 2020-07-27 ENCOUNTER — Ambulatory Visit: Payer: Medicare Other | Admitting: *Deleted

## 2020-07-27 ENCOUNTER — Other Ambulatory Visit: Payer: Self-pay

## 2020-07-27 DIAGNOSIS — M25571 Pain in right ankle and joints of right foot: Secondary | ICD-10-CM | POA: Diagnosis not present

## 2020-07-27 DIAGNOSIS — G8929 Other chronic pain: Secondary | ICD-10-CM | POA: Diagnosis not present

## 2020-07-27 DIAGNOSIS — M25572 Pain in left ankle and joints of left foot: Secondary | ICD-10-CM | POA: Diagnosis not present

## 2020-07-27 DIAGNOSIS — M25561 Pain in right knee: Secondary | ICD-10-CM | POA: Diagnosis not present

## 2020-07-27 DIAGNOSIS — M6281 Muscle weakness (generalized): Secondary | ICD-10-CM

## 2020-07-27 NOTE — Therapy (Signed)
[facility] [place], [place], [address] Phone: [phone]   Fax:  [phone]  Physical Therapy Treatment  Patient Details  Name: Ashlee Hoffman MRN: [id number] Date of Birth: 03/22/1981 Referring Provider (PT): Celesta Gentile, DPM   Encounter Date: 07/27/2020   PT End of Session - 07/27/20 1036    Visit Number 7    Number of Visits 12    Date for PT Re-Evaluation 08/23/20    Authorization Type Progress note every 10th visit, KX modifer at 15th visit.    PT Start Time 1031    PT Stop Time 1119    PT Time Calculation (min) 48 min           Past Medical History:  Diagnosis Date  . 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 Mental disability   . Shortness of breath    with exertion    Past Surgical History:  Procedure Laterality Date  . LUMBAR LAMINECTOMY/DECOMPRESSION MICRODISCECTOMY N/A 05/24/2013   Procedure: LUMBAR LAMINECTOMY/DECOMPRESSION MICRODISCECTOMY 1 LEVEL LEFT LUMBAR FIVE-SACARAL-ONE;  Surgeon: Charlie Pitter, MD;  Location: [facility];  Service: Neurosurgery;  Laterality: N/A;  left    There were no vitals filed for this visit.                      [facility] Adult PT Treatment/Exercise - 07/27/20 0001      Exercises   Exercises Ankle      Ankle Exercises: Aerobic   Nustep L4 x13 min      Ankle Exercises: Standing   Rocker Board 5 minutes    Heel Raises Both;20 reps    Toe Raise 20 reps    Balance Beam x5 RT sidestepping    Other Standing Ankle Exercises R forward lunge on 8" step for ROM x20 rep      Ankle Exercises: Seated   Ankle Circles/Pumps Strengthening;Both;20 reps;10 reps    Ankle Circles/Pumps Limitations 1.5#    Other Seated Ankle Exercises Seated B ankle isolator 4D strengthening x30 reps each    Other Seated Ankle Exercises --                       PT Long Term Goals - 07/19/20 1224      PT LONG TERM GOAL #1   Title Patient will be independent with HEP     Time 6    Period Weeks    Status On-going      PT LONG TERM GOAL #2   Title Patient will demonstrate 4/5 or greater bilateral ankle MMT to improve stability during functional tasks.    Time 6    Period Weeks    Status On-going      PT LONG TERM GOAL #3   Title Patient will report ability to walk community distances with bilateral ankle pain less than or equal to 2-3/10 on Faces Pain Scale.    Time 6    Period Weeks    Status On-going                 Plan - 07/27/20 1038    Clinical Impression Statement Pt arrived today with RT ankle pain>LT 3/10 and  wanted to try vaso end of Rx.  Rx focused on Bil ankle strengthening and proprioception. Pt needs tactile and verbal cuing for technique. Vaso appllied to RT foot and tolerated well.    Personal Factors and Comorbidities Comorbidity 1    Comorbidities mental  disability, lumbar laminectomy 2014, wrist surgery 2020    Examination-Activity Limitations Stand;Locomotion Level    Stability/Clinical Decision Making Stable/Uncomplicated    Rehab Potential Fair    PT Frequency 2x / week    PT Duration 6 weeks    PT Treatment/Interventions ADLs/Self Care Home Management;Cryotherapy;Electrical Stimulation;Moist Heat;Ultrasound;Gait training;Stair training;Functional mobility training;Therapeutic activities;Therapeutic exercise;Balance training;Neuromuscular re-education;Manual techniques;Passive range of motion;Patient/family education;Vasopneumatic Device;Taping;Iontophoresis 4mg /ml Dexamethasone    PT Next Visit Plan nustep or bike, ankle ROM, ankle strengthening, balance activities per tolerance, modalities PRN for pain relief.    PT Home Exercise Plan see patient education section    Consulted and Agree with Plan of Care Patient           Patient will benefit from skilled therapeutic intervention in order to improve the following deficits and impairments:  Abnormal gait, Difficulty walking, Decreased range of motion, Decreased  balance, Decreased strength, Pain, Decreased activity tolerance  Visit Diagnosis: Pain in right ankle and joints of right foot  Pain in left ankle and joints of left foot  Muscle weakness (generalized)  Chronic pain of right knee     Problem List Patient Active Problem List   Diagnosis Date Noted  . Wrist joint effusion 05/01/2018  . Morbid obesity ([place]) 07/04/2015  . HNP (herniated nucleus pulposus), lumbar 05/24/2013  . Mentally disabled 01/21/2013    Deontaye Civello,CHRIS, PTA 07/27/2020, 11:34 AM  [facility] [place], [place], [address] Phone: [phone]   Fax:  [phone]  Name: Ashlee Hoffman MRN: [id number] Date of Birth: Oct 27, 1980

## 2020-07-31 ENCOUNTER — Ambulatory Visit: Payer: Medicare Other | Admitting: Physical Therapy

## 2020-07-31 ENCOUNTER — Other Ambulatory Visit: Payer: Self-pay

## 2020-07-31 ENCOUNTER — Encounter: Payer: Self-pay | Admitting: Physical Therapy

## 2020-07-31 DIAGNOSIS — M6281 Muscle weakness (generalized): Secondary | ICD-10-CM

## 2020-07-31 DIAGNOSIS — M25572 Pain in left ankle and joints of left foot: Secondary | ICD-10-CM | POA: Diagnosis not present

## 2020-07-31 DIAGNOSIS — G8929 Other chronic pain: Secondary | ICD-10-CM | POA: Diagnosis not present

## 2020-07-31 DIAGNOSIS — M25571 Pain in right ankle and joints of right foot: Secondary | ICD-10-CM

## 2020-07-31 DIAGNOSIS — M25561 Pain in right knee: Secondary | ICD-10-CM | POA: Diagnosis not present

## 2020-07-31 NOTE — Therapy (Signed)
[facility] [place], [place], [address] Phone: [phone]   Fax:  [phone]  Physical Therapy Treatment  Patient Details  Name: Ashlee Hoffman MRN: [id number] Date of Birth: September 14, 1981 Referring Provider (PT): Celesta Gentile, DPM   Encounter Date: 07/31/2020   PT End of Session - 07/31/20 0947    Visit Number 8    Number of Visits 12    Date for PT Re-Evaluation 08/23/20    Authorization Type Progress note every 10th visit, KX modifer at 15th visit.    PT Start Time [phone]    PT Stop Time 1030    PT Time Calculation (min) 38 min    Activity Tolerance Patient tolerated treatment well    Behavior During Therapy WFL for tasks assessed/performed           Past Medical History:  Diagnosis Date  . 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 Mental disability   . Shortness of breath    with exertion    Past Surgical History:  Procedure Laterality Date  . LUMBAR LAMINECTOMY/DECOMPRESSION MICRODISCECTOMY N/A 05/24/2013   Procedure: LUMBAR LAMINECTOMY/DECOMPRESSION MICRODISCECTOMY 1 LEVEL LEFT LUMBAR FIVE-SACARAL-ONE;  Surgeon: Charlie Pitter, MD;  Location: [place] [facility];  Service: Neurosurgery;  Laterality: N/A;  left    There were no vitals filed for this visit.   Subjective Assessment - 07/31/20 0947    Subjective COVID 19 screening performed on patient upon arrival. Patient reports more pain in R plantar surface of foot.    Pertinent History mental disability, lumbar laminectomy 2014, wrist surgery 2020    Limitations Walking;Standing    How long can you walk comfortably? community distances    Diagnostic tests n/a    Patient Stated Goals decrease pain    Currently in Pain? Other (Comment)   No pain assessment provided by patient             [facility] PT Assessment - 07/31/20 0001      Assessment   Medical Diagnosis Chronic pain of both ankles    Referring Provider (PT) Celesta Gentile, DPM    Next MD Visit 3 months, January  2022    Prior Therapy yes, for knees      Precautions   Precautions None                         [facility] Adult PT Treatment/Exercise - 07/31/20 0001      Ankle Exercises: Aerobic   Nustep L4 x13 min      Ankle Exercises: Standing   SLS BLE SLS 2x1 min each; limited UE support    Heel Raises Both;20 reps   x15 reps with ball squeeze   Toe Raise 20 reps    Other Standing Ankle Exercises Tandem stance on floor x1 min with limited UE support      Ankle Exercises: Seated   Ankle Circles/Pumps AROM;Right;20 reps    Ankle Circles/Pumps Limitations circles, squares    Other Seated Ankle Exercises 4D R ankle strengthening green theraband x30 reps each    Other Seated Ankle Exercises Rockerboard DF/PF x2 min, Prostretch x2 min                       PT Long Term Goals - 07/19/20 1224      PT LONG TERM GOAL #1   Title Patient will be independent with HEP    Time 6    Period Weeks  Status On-going      PT LONG TERM GOAL #2   Title Patient will demonstrate 4/5 or greater bilateral ankle MMT to improve stability during functional tasks.    Time 6    Period Weeks    Status On-going      PT LONG TERM GOAL #3   Title Patient will report ability to walk community distances with bilateral ankle pain less than or equal to 2-3/10 on Faces Pain Scale.    Time 6    Period Weeks    Status On-going                 Plan - 07/31/20 1039    Clinical Impression Statement Patient presented in clinic with reports of more plantar surface pain of R foot. Patient guided through more resisted strengthening exercises with addition of balance activities. Patient required more multimodal cueing for appropriate technique of resisted ankle strengthening with green theraband. Fairly good stabiltiy with SLS and narrow BOS.    Personal Factors and Comorbidities Comorbidity 1    Comorbidities mental disability, lumbar laminectomy 2014, wrist surgery 2020     Examination-Activity Limitations Stand;Locomotion Level    Stability/Clinical Decision Making Stable/Uncomplicated    Rehab Potential Fair    PT Frequency 2x / week    PT Duration 6 weeks    PT Treatment/Interventions ADLs/Self Care Home Management;Cryotherapy;Electrical Stimulation;Moist Heat;Ultrasound;Gait training;Stair training;Functional mobility training;Therapeutic activities;Therapeutic exercise;Balance training;Neuromuscular re-education;Manual techniques;Passive range of motion;Patient/family education;Vasopneumatic Device;Taping;Iontophoresis 4mg /ml Dexamethasone    PT Next Visit Plan nustep or bike, ankle ROM, ankle strengthening, balance activities per tolerance, modalities PRN for pain relief.    PT Home Exercise Plan see patient education section    Consulted and Agree with Plan of Care Patient           Patient will benefit from skilled therapeutic intervention in order to improve the following deficits and impairments:  Abnormal gait, Difficulty walking, Decreased range of motion, Decreased balance, Decreased strength, Pain, Decreased activity tolerance  Visit Diagnosis: Pain in right ankle and joints of right foot  Pain in left ankle and joints of left foot  Muscle weakness (generalized)     Problem List Patient Active Problem List   Diagnosis Date Noted  . Wrist joint effusion 05/01/2018  . Morbid obesity ([place]) 07/04/2015  . HNP (herniated nucleus pulposus), lumbar 05/24/2013  . Mentally disabled 01/21/2013    Standley Brooking, PTA 07/31/2020, 10:49 AM  [facility] [place], [place], [address] Phone: [phone]   Fax:  [phone]  Name: Ashlee Hoffman MRN: [id number] Date of Birth: 23-Jul-1981

## 2020-08-02 ENCOUNTER — Other Ambulatory Visit: Payer: Self-pay | Admitting: Nurse Practitioner

## 2020-08-02 ENCOUNTER — Encounter: Payer: Self-pay | Admitting: Physical Therapy

## 2020-08-02 ENCOUNTER — Other Ambulatory Visit: Payer: Self-pay

## 2020-08-02 ENCOUNTER — Ambulatory Visit: Payer: Medicare Other | Admitting: Physical Therapy

## 2020-08-02 DIAGNOSIS — M25572 Pain in left ankle and joints of left foot: Secondary | ICD-10-CM

## 2020-08-02 DIAGNOSIS — M6281 Muscle weakness (generalized): Secondary | ICD-10-CM | POA: Diagnosis not present

## 2020-08-02 DIAGNOSIS — Z1231 Encounter for screening mammogram for malignant neoplasm of breast: Secondary | ICD-10-CM

## 2020-08-02 DIAGNOSIS — M25571 Pain in right ankle and joints of right foot: Secondary | ICD-10-CM

## 2020-08-02 DIAGNOSIS — G8929 Other chronic pain: Secondary | ICD-10-CM | POA: Diagnosis not present

## 2020-08-02 DIAGNOSIS — M25561 Pain in right knee: Secondary | ICD-10-CM | POA: Diagnosis not present

## 2020-08-02 NOTE — Therapy (Signed)
[facility] [place], [place], [address] Phone: [phone]   Fax:  [phone]  Physical Therapy Treatment  Patient Details  Name: Ashlee Hoffman MRN: [id number] Date of Birth: [birth year] Referring Provider (PT): Celesta Gentile, DPM   Encounter Date: 08/02/2020   PT End of Session - 08/02/20 0955    Visit Number 9    Number of Visits 12    Date for PT Re-Evaluation 08/23/20    Authorization Type Progress note every 10th visit, KX modifer at 15th visit.    PT Start Time [phone]    PT Stop Time 1028    PT Time Calculation (min) 40 min    Activity Tolerance Patient tolerated treatment well    Behavior During Therapy WFL for tasks assessed/performed           Past Medical History:  Diagnosis Date  . 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 Mental disability   . Shortness of breath    with exertion    Past Surgical History:  Procedure Laterality Date  . LUMBAR LAMINECTOMY/DECOMPRESSION MICRODISCECTOMY N/A 05/24/2013   Procedure: LUMBAR LAMINECTOMY/DECOMPRESSION MICRODISCECTOMY 1 LEVEL LEFT LUMBAR FIVE-SACARAL-ONE;  Surgeon: Charlie Pitter, MD;  Location: [place] [facility];  Service: Neurosurgery;  Laterality: N/A;  left    There were no vitals filed for this visit.   Subjective Assessment - 08/02/20 0952    Subjective COVID 19 screening performed on patient upon arrival. Patient reports more pain in anterior ankle with Nustep.    Pertinent History mental disability, lumbar laminectomy 2014, wrist surgery 2020    Limitations Walking;Standing    How long can you walk comfortably? community distances    Diagnostic tests n/a    Patient Stated Goals decrease pain    Currently in Pain? Yes    Pain Score --   No pain score provided   Pain Location Ankle    Pain Orientation Right;Anterior    Pain Descriptors / Indicators Discomfort    Pain Type Chronic pain    Pain Onset More than a month ago    Pain Frequency Intermittent               [facility] PT Assessment - 08/02/20 0001      Assessment   Medical Diagnosis Chronic pain of both ankles    Referring Provider (PT) Celesta Gentile, DPM    Next MD Visit 3 months, January 2022    Prior Therapy yes, for knees      Precautions   Precautions None                         [facility] Adult PT Treatment/Exercise - 08/02/20 0001      Ankle Exercises: Aerobic   Nustep L4 x15 min      Ankle Exercises: Standing   Rocker Board 3 minutes    Heel Raises Both;20 reps    Toe Raise 20 reps      Ankle Exercises: Seated   Toe Raise 20 reps   inversion with ball squeeze /eversion   BAPS Sitting;Level 2;10 reps;Limitations    BAPS Limitations DF/PF, Inv/Ev    Other Seated Ankle Exercises R ankle prostretch x3 min    Other Seated Ankle Exercises Toe crunch x20 reps                       PT Long Term Goals - 08/02/20 1014      PT LONG TERM  GOAL #1   Title Patient will be independent with HEP    Time 6    Period Weeks    Status Partially Met      PT LONG TERM GOAL #2   Title Patient will demonstrate 4/5 or greater bilateral ankle MMT to improve stability during functional tasks.    Time 6    Period Weeks    Status On-going      PT LONG TERM GOAL #3   Title Patient will report ability to walk community distances with bilateral ankle pain less than or equal to 2-3/10 on Faces Pain Scale.    Time 6    Period Weeks    Status On-going   4/10 according to FACES scale 08/02/2020                Plan - 08/02/20 1110    Clinical Impression Statement Patient progressed through therex today with reports of anterior R ankle pain while on Nustep. No other complaints of pain throughout therex session. Patient requires constant supervision of technique and multimodal cueing for any technique corrections. Patient has worn slide on flip flops to the last several appointments.    Personal Factors and Comorbidities Comorbidity 1    Comorbidities mental  disability, lumbar laminectomy 2014, wrist surgery 2020    Examination-Activity Limitations Stand;Locomotion Level    Stability/Clinical Decision Making Stable/Uncomplicated    Rehab Potential Fair    PT Frequency 2x / week    PT Duration 6 weeks    PT Treatment/Interventions ADLs/[facility] Home Management;Cryotherapy;Electrical Stimulation;Moist Heat;Ultrasound;Gait training;Stair training;Functional mobility training;Therapeutic activities;Therapeutic exercise;Balance training;Neuromuscular re-education;Manual techniques;Passive range of motion;Patient/family education;Vasopneumatic Device;Taping;Iontophoresis 92m/ml Dexamethasone    PT Next Visit Plan nustep or bike, ankle ROM, ankle strengthening, balance activities per tolerance, modalities PRN for pain relief.    PT Home Exercise Plan see patient education section    Consulted and Agree with Plan of Care Patient           Patient will benefit from skilled therapeutic intervention in order to improve the following deficits and impairments:  Abnormal gait, Difficulty walking, Decreased range of motion, Decreased balance, Decreased strength, Pain, Decreased activity tolerance  Visit Diagnosis: Pain in right ankle and joints of right foot  Pain in left ankle and joints of left foot  Muscle weakness (generalized)     Problem List Patient Active Problem List   Diagnosis Date Noted  . Wrist joint effusion 05/01/2018  . [place] 07/04/[place]  . HNP (herniated nucleus pulposus), lumbar 05/24/2013  . Mentally disabled 01/21/2013    KStandley Brooking PTA 08/02/2020, 11:12 AM  [facility] [address]Phone: [phone]  Fax:  [phone] Name: Ashlee SelfMRN: [id number] of Birth: 626-Jun-1982

## 2020-08-07 ENCOUNTER — Other Ambulatory Visit: Payer: Self-pay

## 2020-08-07 ENCOUNTER — Ambulatory Visit: Payer: Medicare Other | Admitting: Physical Therapy

## 2020-08-07 ENCOUNTER — Encounter: Payer: Self-pay | Admitting: Physical Therapy

## 2020-08-07 DIAGNOSIS — M6281 Muscle weakness (generalized): Secondary | ICD-10-CM | POA: Diagnosis not present

## 2020-08-07 DIAGNOSIS — M25571 Pain in right ankle and joints of right foot: Secondary | ICD-10-CM | POA: Diagnosis not present

## 2020-08-07 DIAGNOSIS — M25572 Pain in left ankle and joints of left foot: Secondary | ICD-10-CM

## 2020-08-07 DIAGNOSIS — M25561 Pain in right knee: Secondary | ICD-10-CM | POA: Diagnosis not present

## 2020-08-07 DIAGNOSIS — G8929 Other chronic pain: Secondary | ICD-10-CM | POA: Diagnosis not present

## 2020-08-07 NOTE — Therapy (Addendum)
[facility] [place], [place], [address] Phone: [phone]   Fax:  [phone]  Physical Therapy Treatment  Progress Note Reporting Period 07/05/2020 to 08/23/2020  See note below for Objective Data and Assessment of Progress/Goals. Patient's goals are ongoing at this time, still with pain. Gabriela Eves, PT, DPT     Patient Details  Name: Ashlee Hoffman MRN: [id number] Date of Birth: [birth year] Referring Provider (PT): Celesta Gentile, DPM   Encounter Date: 08/07/2020   PT End of Session - 08/07/20 0949    Visit Number 10    Number of Visits 12    Date for PT Re-Evaluation 08/23/20    Authorization Type Progress note every 10th visit, KX modifer at 15th visit.    PT Start Time [phone]    PT Stop Time 1030    PT Time Calculation (min) 43 min    Activity Tolerance Patient tolerated treatment well    Behavior During Therapy WFL for tasks assessed/performed           Past Medical History:  Diagnosis Date  . 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 Mental disability   . Shortness of breath    with exertion    Past Surgical History:  Procedure Laterality Date  . LUMBAR LAMINECTOMY/DECOMPRESSION MICRODISCECTOMY N/A 05/24/2013   Procedure: LUMBAR LAMINECTOMY/DECOMPRESSION MICRODISCECTOMY 1 LEVEL LEFT LUMBAR FIVE-SACARAL-ONE;  Surgeon: Charlie Pitter, MD;  Location: [place] [facility];  Service: Neurosurgery;  Laterality: N/A;  left    There were no vitals filed for this visit.   Subjective Assessment - 08/07/20 0948    Subjective COVID 19 screening performed on patient upon arrival. Patient reports L foot cramp but R ankle is a little sore.    Pertinent History mental disability, lumbar laminectomy 2014, wrist surgery 2020    Limitations Walking;Standing    How long can you walk comfortably? community distances    Diagnostic tests n/a    Patient Stated Goals decrease pain    Currently in Pain? Yes    Pain Score --   No pain score  provided   Pain Location Ankle    Pain Orientation Right    Pain Descriptors / Indicators Sore    Pain Type Chronic pain    Pain Onset More than a month ago    Pain Frequency Intermittent              [facility] PT Assessment - 08/07/20 0001      Assessment   Medical Diagnosis Chronic pain of both ankles    Referring Provider (PT) Celesta Gentile, DPM    Next MD Visit 3 months, January 2022    Prior Therapy yes, for knees      Precautions   Precautions None                         [facility] Adult PT Treatment/Exercise - 08/07/20 0001      Knee/Hip Exercises: Standing   Forward Lunges Both;10 reps    Forward Lunges Limitations off 6" step    Forward Step Up Both;20 reps;Hand Hold: 2;Step Height: 6"    Step Down Both;2 sets;10 reps;Hand Hold: 2;Step Height: 6"      Ankle Exercises: Aerobic   Nustep L4 x15 min      Ankle Exercises: Standing   Rocker Board 3 minutes    Heel Raises Both;20 reps    Toe Raise 20 reps      Ankle Exercises: Seated  BAPS Sitting;Level 2;15 reps    BAPS Limitations DF/PF, Inv/Ev, attempted circles    Other Seated Ankle Exercises B toe curls on floor x20 rpes                       PT Long Term Goals - 08/02/20 1014      PT LONG TERM GOAL #1   Title Patient will be independent with HEP    Time 6    Period Weeks    Status Partially Met      PT LONG TERM GOAL #2   Title Patient will demonstrate 4/5 or greater bilateral ankle MMT to improve stability during functional tasks.    Time 6    Period Weeks    Status On-going      PT LONG TERM GOAL #3   Title Patient will report ability to walk community distances with bilateral ankle pain less than or equal to 2-3/10 on Faces Pain Scale.    Time 6    Period Weeks    Status On-going   4/10 according to FACES scale 08/02/2020                Plan - 08/07/20 1037    Clinical Impression Statement Patient presented in clinic with reports of cramping in L knee and  some R ankle discomfort. Patient guided through more functional ankle strengthening with intermittant rpeorts of R ankle discomfort. Patient required constant multimodal cueing in order to maintain proper technique. Patient reported difficulty with L toe curls.    Personal Factors and Comorbidities Comorbidity 1    Comorbidities mental disability, lumbar laminectomy 2014, wrist surgery 2020    Examination-Activity Limitations Stand;Locomotion Level    Stability/Clinical Decision Making Stable/Uncomplicated    Rehab Potential Fair    PT Frequency 2x / week    PT Duration 6 weeks    PT Treatment/Interventions ADLs/Self Care Home Management;Cryotherapy;Electrical Stimulation;Moist Heat;Ultrasound;Gait training;Stair training;Functional mobility training;Therapeutic activities;Therapeutic exercise;Balance training;Neuromuscular re-education;Manual techniques;Passive range of motion;Patient/family education;Vasopneumatic Device;Taping;Iontophoresis 43m/ml Dexamethasone    PT Next Visit Plan nustep or bike, ankle ROM, ankle strengthening, balance activities per tolerance, modalities PRN for pain relief.    PT Home Exercise Plan see patient education section    Consulted and Agree with Plan of Care Patient           Patient will benefit from skilled therapeutic intervention in order to improve the following deficits and impairments:  Abnormal gait, Difficulty walking, Decreased range of motion, Decreased balance, Decreased strength, Pain, Decreased activity tolerance  Visit Diagnosis: Pain in right ankle and joints of right foot  Pain in left ankle and joints of left foot  Muscle weakness (generalized)     Problem List Patient Active Problem List   Diagnosis Date Noted  . Wrist joint effusion 05/01/2018  . [place]bid [place] 07/04/2015  . HNP (herniated nucleus pulposus), lumbar 05/24/2013  . Mentally disabled 01/21/2013    KStandley Brooking PTA 08/07/2020, 10:43 AM  [facility] [address]Phone: [phone]  Fax:  [phone] Name: KKaidan SpenglerMRN: [id number] of Birth: [birth year]

## 2020-08-21 ENCOUNTER — Other Ambulatory Visit: Payer: Self-pay

## 2020-08-21 ENCOUNTER — Encounter: Payer: Self-pay | Admitting: Physical Therapy

## 2020-08-21 ENCOUNTER — Ambulatory Visit: Payer: Medicare Other | Attending: Podiatry | Admitting: Physical Therapy

## 2020-08-21 DIAGNOSIS — M6281 Muscle weakness (generalized): Secondary | ICD-10-CM

## 2020-08-21 DIAGNOSIS — M25571 Pain in right ankle and joints of right foot: Secondary | ICD-10-CM | POA: Diagnosis not present

## 2020-08-21 DIAGNOSIS — L821 Other seborrheic keratosis: Secondary | ICD-10-CM | POA: Diagnosis not present

## 2020-08-21 DIAGNOSIS — M25572 Pain in left ankle and joints of left foot: Secondary | ICD-10-CM

## 2020-08-21 DIAGNOSIS — L218 Other seborrheic dermatitis: Secondary | ICD-10-CM | POA: Diagnosis not present

## 2020-08-21 DIAGNOSIS — D225 Melanocytic nevi of trunk: Secondary | ICD-10-CM | POA: Diagnosis not present

## 2020-08-21 DIAGNOSIS — L814 Other melanin hyperpigmentation: Secondary | ICD-10-CM | POA: Diagnosis not present

## 2020-08-21 DIAGNOSIS — D2262 Melanocytic nevi of left upper limb, including shoulder: Secondary | ICD-10-CM | POA: Diagnosis not present

## 2020-08-21 NOTE — Therapy (Signed)
[facility] [place], [place], [address] Phone: [phone]   Fax:  [phone]  Physical Therapy Treatment  Patient Details  Name: Ashlee Hoffman MRN: [id number] Date of Birth: 22-Sep-1980 Referring Provider (PT): Celesta Gentile, DPM   Encounter Date: 08/21/2020   PT End of Session - 08/21/20 1313    Visit Number 11    Number of Visits 12    Date for PT Re-Evaluation 08/23/20    Authorization Type Progress note every 10th visit, KX modifer at 15th visit.    PT Start Time 1304    PT Stop Time 1343    PT Time Calculation (min) 39 min    Activity Tolerance Patient tolerated treatment well    Behavior During Therapy WFL for tasks assessed/performed           Past Medical History:  Diagnosis Date  . 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 Mental disability   . Shortness of breath    with exertion    Past Surgical History:  Procedure Laterality Date  . LUMBAR LAMINECTOMY/DECOMPRESSION MICRODISCECTOMY N/A 05/24/2013   Procedure: LUMBAR LAMINECTOMY/DECOMPRESSION MICRODISCECTOMY 1 LEVEL LEFT LUMBAR FIVE-SACARAL-ONE;  Surgeon: Charlie Pitter, MD;  Location: [place] [facility];  Service: Neurosurgery;  Laterality: N/A;  left    There were no vitals filed for this visit.   Subjective Assessment - 08/21/20 1304    Subjective COVID 19 screening performed on patient upon arrival. Patient reports that she went Christmas shopping over the weekend and her ankles are sore.    Pertinent History mental disability, lumbar laminectomy 2014, wrist surgery 2020    Limitations Walking;Standing    How long can you walk comfortably? community distances    Diagnostic tests n/a    Patient Stated Goals decrease pain    Currently in Pain? Yes    Pain Score 6     Pain Location Ankle    Pain Orientation Right    Pain Descriptors / Indicators Sore    Pain Type Chronic pain    Pain Onset More than a month ago    Pain Frequency Intermittent               [facility] PT Assessment - 08/21/20 0001      Assessment   Medical Diagnosis Chronic pain of both ankles    Referring Provider (PT) Celesta Gentile, DPM    Next MD Visit 3 months, January 2022    Prior Therapy yes, for knees      Precautions   Precautions None                         [facility] Adult PT Treatment/Exercise - 08/21/20 0001      Modalities   Modalities Vasopneumatic      Vasopneumatic   Number Minutes Vasopneumatic  10 minutes    Vasopnuematic Location  Ankle   B ankles   Vasopneumatic Pressure Low    Vasopneumatic Temperature  34/pain      Ankle Exercises: Aerobic   Nustep L3 x13 min      Ankle Exercises: Standing   Rocker Board 5 minutes    Heel Raises Both;20 reps   with ball squeeze   Toe Raise 20 reps    Balance Beam x5 RT sidestepping                       PT Long Term Goals - 08/02/20 1014  PT LONG TERM GOAL #1   Title Patient will be independent with HEP    Time 6    Period Weeks    Status Partially Met      PT LONG TERM GOAL #2   Title Patient will demonstrate 4/5 or greater bilateral ankle MMT to improve stability during functional tasks.    Time 6    Period Weeks    Status On-going      PT LONG TERM GOAL #3   Title Patient will report ability to walk community distances with bilateral ankle pain less than or equal to 2-3/10 on Faces Pain Scale.    Time 6    Period Weeks    Status On-going   4/10 according to FACES scale 08/02/2020                Plan - 08/21/20 1335    Clinical Impression Statement Patient presented in clinic with reports of fatigue and B ankle soreness after a lot of walking over the weekend. Patient limited somewhat by standing therex due to reports of increased soreness and fatigue between exercises. Patient reports some discomfort with stairs or walking fast. Normal vasopneumatic response noted following removal of the modality.    Personal Factors and Comorbidities Comorbidity 1     Comorbidities mental disability, lumbar laminectomy 2014, wrist surgery 2020    Examination-Activity Limitations Stand;Locomotion Level    Stability/Clinical Decision Making Stable/Uncomplicated    Rehab Potential Fair    PT Frequency 2x / week    PT Duration 6 weeks    PT Treatment/Interventions ADLs/Self Care Home Management;Cryotherapy;Electrical Stimulation;Moist Heat;Ultrasound;Gait training;Stair training;Functional mobility training;Therapeutic activities;Therapeutic exercise;Balance training;Neuromuscular re-education;Manual techniques;Passive range of motion;Patient/family education;Vasopneumatic Device;Taping;Iontophoresis 15m/ml Dexamethasone    PT Next Visit Plan nustep or bike, ankle ROM, ankle strengthening, balance activities per tolerance, modalities PRN for pain relief.    PT Home Exercise Plan see patient education section    Consulted and Agree with Plan of Care Patient           Patient will benefit from skilled therapeutic intervention in order to improve the following deficits and impairments:  Abnormal gait, Difficulty walking, Decreased range of motion, Decreased balance, Decreased strength, Pain, Decreased activity tolerance  Visit Diagnosis: Pain in right ankle and joints of right foot  Pain in left ankle and joints of left foot  Muscle weakness (generalized)     Problem List Patient Active Problem List   Diagnosis Date Noted  . Wrist joint effusion 05/01/2018  . [place]bid [place] 07/04/2015  . HNP (herniated nucleus pulposus), lumbar 05/24/2013  . Mentally disabled 01/21/2013    KStandley Brooking PTA 08/21/2020, 2:45 PM  [facility] [address]Phone: [phone]  Fax:  [phone] Name: KMelinna LinarezMRN: [id number] of Birth: 6Aug 30, 1982

## 2020-08-23 ENCOUNTER — Ambulatory Visit: Payer: Medicare Other | Admitting: Physical Therapy

## 2020-08-23 ENCOUNTER — Ambulatory Visit
Admission: RE | Admit: 2020-08-23 | Discharge: 2020-08-23 | Disposition: A | Discharge status: Home / Self Care | Payer: Medicare Other | Source: Ambulatory Visit | Attending: Nurse Practitioner | Admitting: Nurse Practitioner

## 2020-08-23 ENCOUNTER — Other Ambulatory Visit: Payer: Self-pay

## 2020-08-23 ENCOUNTER — Encounter: Payer: Self-pay | Admitting: Physical Therapy

## 2020-08-23 DIAGNOSIS — M25572 Pain in left ankle and joints of left foot: Secondary | ICD-10-CM

## 2020-08-23 DIAGNOSIS — M6281 Muscle weakness (generalized): Secondary | ICD-10-CM | POA: Diagnosis not present

## 2020-08-23 DIAGNOSIS — Z1231 Encounter for screening mammogram for malignant neoplasm of breast: Secondary | ICD-10-CM

## 2020-08-23 DIAGNOSIS — M25571 Pain in right ankle and joints of right foot: Secondary | ICD-10-CM | POA: Diagnosis not present

## 2020-08-23 NOTE — Therapy (Signed)
[facility] [place], [place], [address] Phone: [phone]   Fax:  [phone]  Physical Therapy Treatment   PHYSICAL THERAPY DISCHARGE SUMMARY  Visits from Start of Care: 12  Current functional level related to goals / functional outcomes: See below   Remaining deficits: See goals   Education / Equipment: HEP Plan: Patient agrees to discharge.  Patient goals were partially met. Patient is being discharged due to lack of progress.  ?????  Gabriela Eves, PT, DPT   Patient Details  Name: Ashlee Hoffman MRN: [id number] Date of Birth: 03-Jul-1981 Referring Provider (PT): Celesta Gentile, DPM   Encounter Date: 08/23/2020   PT End of Session - 08/23/20 0951    Visit Number 12    Number of Visits 12    Date for PT Re-Evaluation 08/23/20    Authorization Type Progress note every 10th visit, KX modifer at 15th visit.    PT Start Time [id number]    PT Stop Time 1037    PT Time Calculation (min) 48 min    Activity Tolerance Patient tolerated treatment well    Behavior During Therapy WFL for tasks assessed/performed           Past Medical History:  Diagnosis Date  . 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 Mental disability   . Shortness of breath    with exertion    Past Surgical History:  Procedure Laterality Date  . LUMBAR LAMINECTOMY/DECOMPRESSION MICRODISCECTOMY N/A 05/24/2013   Procedure: LUMBAR LAMINECTOMY/DECOMPRESSION MICRODISCECTOMY 1 LEVEL LEFT LUMBAR FIVE-SACARAL-ONE;  Surgeon: Charlie Pitter, MD;  Location: [facility];  Service: Neurosurgery;  Laterality: N/A;  left    There were no vitals filed for this visit.   Subjective Assessment - 08/23/20 0949    Subjective COVID 19 screening performed on patient upon arrival. Patient reports her ankles are still tired a little.    Pertinent History mental disability, lumbar laminectomy 2014, wrist surgery 2020    Limitations Walking;Standing    How long can you walk  comfortably? community distances    Diagnostic tests n/a    Patient Stated Goals decrease pain    Currently in Pain? Yes    Pain Score --   "a little"   Pain Location Ankle    Pain Orientation Right    Pain Descriptors / Indicators Tiring    Pain Type Chronic pain    Pain Onset More than a month ago    Pain Frequency Intermittent              [facility] PT Assessment - 08/23/20 0001      Assessment   Medical Diagnosis Chronic pain of both ankles    Referring Provider (PT) Celesta Gentile, DPM    Next MD Visit 3 months, January 2022    Prior Therapy yes, for knees      Precautions   Precautions None      Restrictions   Weight Bearing Restrictions No      ROM / Strength   AROM / PROM / Strength Strength      Strength   Overall Strength Within functional limits for tasks performed    Strength Assessment Site Ankle    Right/Left Ankle Right;Left    Right Ankle Dorsiflexion 4+/5    Right Ankle Plantar Flexion 4+/5    Right Ankle Inversion 4+/5    Right Ankle Eversion 4/5    Left Ankle Dorsiflexion 4+/5    Left Ankle Plantar Flexion 4+/5  Left Ankle Inversion 4+/5    Left Ankle Eversion 4+/5                         [facility] Adult PT Treatment/Exercise - 08/23/20 0001      Modalities   Modalities Vasopneumatic      Vasopneumatic   Number Minutes Vasopneumatic  15 minutes    Vasopnuematic Location  Ankle   B ankle   Vasopneumatic Pressure Low    Vasopneumatic Temperature  34/pain      Ankle Exercises: Aerobic   Nustep L4 x15 min      Ankle Exercises: Standing   SLS BLE SLS on floor; 1x 30 sec holds    Rocker Board 5 minutes    Heel Raises Both;20 reps    Toe Raise 20 reps   with glute squeeze   Other Standing Ankle Exercises Toe press 5 sec holds x30 reps for arch strengthening                  PT Education - 08/23/20 1125    Education Details Continue HEP, use of tennis ball and frozen water bottle for self massage and pain control     Person(s) Educated Patient;Parent(s)    Methods Explanation;Demonstration    Comprehension Verbalized understanding               PT Long Term Goals - 08/23/20 1039      PT LONG TERM GOAL #1   Title Patient will be independent with HEP    Time 6    Period Weeks    Status Partially Met      PT LONG TERM GOAL #2   Title Patient will demonstrate 4/5 or greater bilateral ankle MMT to improve stability during functional tasks.    Time 6    Period Weeks    Status Achieved      PT LONG TERM GOAL #3   Title Patient will report ability to walk community distances with bilateral ankle pain less than or equal to 2-3/10 on Faces Pain Scale.    Time 6    Period Weeks    Status Not Met   4/10 according to FACES scale 08/23/2020                Plan - 08/23/20 1113    Clinical Impression Statement Patient presented in clinic with reports of mild B foot and ankle soreness. Patient guided through more arch strengthening but required greater cueing via demo and VCs. Patient able to tolerate SLS well with very minimal UE support. Patient and her mother both educated to continue HEP for ankle/foot strengthening as well as use of tennis ball or frozen water bottle for pain control and self massage. Normal vasopneumatic response noted following removal of the modality.    Personal Factors and Comorbidities Comorbidity 1    Comorbidities mental disability, lumbar laminectomy 2014, wrist surgery 2020    Examination-Activity Limitations Stand;Locomotion Level    Stability/Clinical Decision Making Stable/Uncomplicated    Rehab Potential Fair    PT Frequency 2x / week    PT Duration 6 weeks    PT Treatment/Interventions ADLs/Self Care Home Management;Cryotherapy;Electrical Stimulation;Moist Heat;Ultrasound;Gait training;Stair training;Functional mobility training;Therapeutic activities;Therapeutic exercise;Balance training;Neuromuscular re-education;Manual techniques;Passive range of  motion;Patient/family education;Vasopneumatic Device;Taping;Iontophoresis 58m/ml Dexamethasone    PT Next Visit Plan D/C.    PT Home Exercise Plan see patient education section    Consulted and Agree with Plan of Care Patient  Patient will benefit from skilled therapeutic intervention in order to improve the following deficits and impairments:  Abnormal gait, Difficulty walking, Decreased range of motion, Decreased balance, Decreased strength, Pain, Decreased activity tolerance  Visit Diagnosis: Pain in right ankle and joints of right foot  Pain in left ankle and joints of left foot  Muscle weakness (generalized)     Problem List Patient Active Problem List   Diagnosis Date Noted  . Wrist joint effusion 05/01/2018  . Morbid obesity ([place]) 07/04/2015  . HNP (herniated nucleus pulposus), lumbar 05/24/2013  . Mentally disabled 01/21/2013    Standley Brooking, PTA 08/23/20 11:28 AM   [facility] [place], [place], [address] Phone: [phone]   Fax:  [phone]  Name: Ashlee Hoffman MRN: [id number] Date of Birth: 05-31-1981

## 2020-08-29 ENCOUNTER — Other Ambulatory Visit (HOSPITAL_COMMUNITY): Payer: Self-pay | Admitting: Orthopedic Surgery

## 2020-08-29 ENCOUNTER — Other Ambulatory Visit: Payer: Self-pay | Admitting: Orthopedic Surgery

## 2020-08-29 DIAGNOSIS — M25531 Pain in right wrist: Secondary | ICD-10-CM | POA: Diagnosis not present

## 2020-08-29 DIAGNOSIS — M25431 Effusion, right wrist: Secondary | ICD-10-CM | POA: Diagnosis not present

## 2020-09-01 ENCOUNTER — Other Ambulatory Visit (HOSPITAL_COMMUNITY): Payer: Self-pay | Admitting: Orthopedic Surgery

## 2020-09-01 DIAGNOSIS — M25531 Pain in right wrist: Secondary | ICD-10-CM

## 2020-09-06 ENCOUNTER — Other Ambulatory Visit: Payer: Self-pay | Admitting: Neurosurgery

## 2020-09-06 DIAGNOSIS — R030 Elevated blood-pressure reading, without diagnosis of hypertension: Secondary | ICD-10-CM | POA: Insufficient documentation

## 2020-09-06 DIAGNOSIS — Z6841 Body Mass Index (BMI) 40.0 and over, adult: Secondary | ICD-10-CM | POA: Insufficient documentation

## 2020-09-06 DIAGNOSIS — M5126 Other intervertebral disc displacement, lumbar region: Secondary | ICD-10-CM

## 2020-09-25 ENCOUNTER — Ambulatory Visit (HOSPITAL_COMMUNITY)
Admission: RE | Admit: 2020-09-25 | Discharge: 2020-09-25 | Disposition: A | Discharge status: Home / Self Care | Payer: Medicare Other | Source: Ambulatory Visit | Attending: Orthopedic Surgery | Admitting: Orthopedic Surgery

## 2020-09-25 ENCOUNTER — Other Ambulatory Visit: Payer: Self-pay

## 2020-09-25 DIAGNOSIS — M25431 Effusion, right wrist: Secondary | ICD-10-CM | POA: Diagnosis not present

## 2020-09-25 DIAGNOSIS — M25531 Pain in right wrist: Secondary | ICD-10-CM | POA: Diagnosis not present

## 2020-10-03 ENCOUNTER — Ambulatory Visit (INDEPENDENT_AMBULATORY_CARE_PROVIDER_SITE_OTHER): Payer: Medicare Other

## 2020-10-03 ENCOUNTER — Ambulatory Visit (INDEPENDENT_AMBULATORY_CARE_PROVIDER_SITE_OTHER): Payer: Medicare Other | Admitting: Podiatry

## 2020-10-03 ENCOUNTER — Other Ambulatory Visit: Payer: Self-pay

## 2020-10-03 DIAGNOSIS — Q828 Other specified congenital malformations of skin: Secondary | ICD-10-CM

## 2020-10-03 DIAGNOSIS — B351 Tinea unguium: Secondary | ICD-10-CM

## 2020-10-03 DIAGNOSIS — M7751 Other enthesopathy of right foot: Secondary | ICD-10-CM

## 2020-10-03 DIAGNOSIS — T148XXA Other injury of unspecified body region, initial encounter: Secondary | ICD-10-CM | POA: Diagnosis not present

## 2020-10-03 DIAGNOSIS — M79674 Pain in right toe(s): Secondary | ICD-10-CM

## 2020-10-03 DIAGNOSIS — B353 Tinea pedis: Secondary | ICD-10-CM | POA: Diagnosis not present

## 2020-10-03 MED ORDER — MELOXICAM 15 MG PO TABS: 15.0000 mg | ORAL_TABLET | Freq: Every day | ORAL | 0 refills | Status: DC

## 2020-10-03 MED ORDER — KETOCONAZOLE 2 % EX CREA: 1.0000 "application " | TOPICAL_CREAM | Freq: Every day | CUTANEOUS | 0 refills | Status: DC

## 2020-10-03 NOTE — Progress Notes (Signed)
Subjective: 40 y.o. returns the office today for painful, elongated, thickened toenails which she cannot trim herself and for calluses. Denies any redness or drainage around the nails or calluses.  Also states that she still having some discomfort to the lateral aspect of the right ankle.  She did put physical therapy without any significant improvement.  She does get swelling to the area as well.  Pain is intermittent.  No recent injury or falls.  She does tend to walk and will her mom states that showed me shoes were in the back of the heel though she was very wide from her rolling. Denies any systemic complaints such as fevers, chills, nausea, vomiting.   Objective: AAO 3, NAD DP/PT pulses palpable, CRT less than 3 seconds  Nails hypertrophic, dystrophic, elongated, brittle, discolored 10. There is tenderness overlying the nails 1-5 bilaterally. There is no surrounding erythema or drainage along the nail sites. Minimal hyperkeratotic tissue present today medial hallux.  No underlying ulceration drainage or signs of infection. There is mild tenderness palpation along the anterior lateral aspect of the ankle joint on the right side there is localized edema but there is no erythema or warmth.  There is no gross ankle instability present.  Ankle, subtalar joint range of motion intact but any restrictions or crepitation. No pain with calf compression, swelling, warmth, erythema.  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 -Hyperkeratotic tissue with sharp debrided x2 without any complications or bleeding -X-rays obtained reviewed of the right ankle.  No evidence of acute fracture or stress fracture or foreign body.  Tri-Lock ankle brace was dispensed.  I ordered an MRI of  the right ankle.  She can use meloxicam as needed but also Voltaren gel intermittently. -For the tinea pedis prescribed ketoconazole  Trula Slade DPM

## 2020-10-03 NOTE — Patient Instructions (Addendum)
For instructions on how to put on your Tri-Lock Ankle Brace, please visit [email or web address]  You can also use Voltaren gel

## 2020-10-04 ENCOUNTER — Ambulatory Visit
Admission: RE | Admit: 2020-10-04 | Discharge: 2020-10-04 | Disposition: A | Discharge status: Home / Self Care | Payer: Medicare Other | Source: Ambulatory Visit | Attending: Neurosurgery | Admitting: Neurosurgery

## 2020-10-04 DIAGNOSIS — M5126 Other intervertebral disc displacement, lumbar region: Secondary | ICD-10-CM

## 2020-10-04 DIAGNOSIS — M545 Low back pain, unspecified: Secondary | ICD-10-CM | POA: Diagnosis not present

## 2020-10-23 ENCOUNTER — Other Ambulatory Visit: Payer: Medicare Other

## 2020-11-06 ENCOUNTER — Other Ambulatory Visit: Payer: Self-pay

## 2020-11-06 ENCOUNTER — Ambulatory Visit (INDEPENDENT_AMBULATORY_CARE_PROVIDER_SITE_OTHER): Payer: Medicare Other | Admitting: Podiatry

## 2020-11-06 DIAGNOSIS — M7751 Other enthesopathy of right foot: Secondary | ICD-10-CM

## 2020-11-06 DIAGNOSIS — T148XXA Other injury of unspecified body region, initial encounter: Secondary | ICD-10-CM

## 2020-11-07 ENCOUNTER — Ambulatory Visit
Admission: RE | Admit: 2020-11-07 | Discharge: 2020-11-07 | Disposition: A | Discharge status: Home / Self Care | Payer: Medicare Other | Source: Ambulatory Visit | Attending: Podiatry | Admitting: Podiatry

## 2020-11-07 ENCOUNTER — Encounter: Payer: Medicare Other | Admitting: Nurse Practitioner

## 2020-11-07 DIAGNOSIS — T148XXA Other injury of unspecified body region, initial encounter: Secondary | ICD-10-CM

## 2020-11-07 DIAGNOSIS — M7661 Achilles tendinitis, right leg: Secondary | ICD-10-CM | POA: Diagnosis not present

## 2020-11-07 DIAGNOSIS — R600 Localized edema: Secondary | ICD-10-CM | POA: Diagnosis not present

## 2020-11-07 DIAGNOSIS — M65871 Other synovitis and tenosynovitis, right ankle and foot: Secondary | ICD-10-CM | POA: Diagnosis not present

## 2020-11-11 ENCOUNTER — Encounter: Payer: Self-pay | Admitting: Podiatry

## 2020-11-11 NOTE — Progress Notes (Signed)
Subjective: 40 year old female presents the office today for follow-up evaluation of right ankle pain.  She is scheduled for an MRI tomorrow of the right ankle.  She states that the left ankle started hurting the same areas.  She denies recent injury or falls or changes otherwise since I last saw her.  Not noticed any increase in swelling. Denies any systemic complaints such as fevers, chills, nausea, vomiting. No acute changes since last appointment, and no other complaints at this time.   Objective: NAD DP/PT pulses palpable bilaterally, CRT less than 3 seconds There is mild tenderness palpation along the anterior lateral aspect ankle joint of the right side and some global tenderness to the lateral aspect the ankle but there is no specific area of pinpoint tenderness.  There is no erythema or warmth.  There is trace edema.  Flexor, extensor tendons appear to be intact.  MMT 5/5. No pain with calf compression, swelling, warmth, erythema  Assessment: Right ankle pain, likely tendinitis however rule out tear of the lateral ankle ligament complex  Plan: -All treatment options discussed with the patient including all alternatives, risks, complications.  -Scheduled for MRI tomorrow.  For now continue with ankle brace as this is helpful.  We will discuss further plans after the MRI. -Patient encouraged to call the office with any questions, concerns, change in symptoms.   Trula Slade DPM

## 2020-11-13 NOTE — Telephone Encounter (Signed)
Patient's mother came to the office for anther issue and we discussed this. I think she was seeing "ligament tear" as a diagnosis. Reviewed MRI. Will start with powersteps, trying new shoes, NSAIDS, ice. She is going to make a follow up in about 3-4 weeks.

## 2020-11-20 ENCOUNTER — Other Ambulatory Visit: Payer: Self-pay

## 2020-11-20 ENCOUNTER — Ambulatory Visit (INDEPENDENT_AMBULATORY_CARE_PROVIDER_SITE_OTHER): Payer: Medicare Other | Admitting: Nurse Practitioner

## 2020-11-20 ENCOUNTER — Encounter: Payer: Self-pay | Admitting: Nurse Practitioner

## 2020-11-20 VITALS — BP 113/80 | HR 75 | Temp 97.0°F | Resp 20 | Ht 63.0 in | Wt 248.0 lb

## 2020-11-20 DIAGNOSIS — Z0001 Encounter for general adult medical examination with abnormal findings: Secondary | ICD-10-CM

## 2020-11-20 DIAGNOSIS — Z0000 Encounter for general adult medical examination without abnormal findings: Secondary | ICD-10-CM

## 2020-11-20 DIAGNOSIS — K219 Gastro-esophageal reflux disease without esophagitis: Secondary | ICD-10-CM | POA: Diagnosis not present

## 2020-11-20 DIAGNOSIS — M5126 Other intervertebral disc displacement, lumbar region: Secondary | ICD-10-CM | POA: Diagnosis not present

## 2020-11-20 DIAGNOSIS — F79 Unspecified intellectual disabilities: Secondary | ICD-10-CM

## 2020-11-20 MED ORDER — MEDROXYPROGESTERONE ACETATE 10 MG PO TABS: 10.0000 mg | ORAL_TABLET | Freq: Every day | ORAL | 3 refills | Status: DC

## 2020-11-20 MED ORDER — OMEPRAZOLE 40 MG PO CPDR: 40.0000 mg | DELAYED_RELEASE_CAPSULE | Freq: Every day | ORAL | 3 refills | Status: DC

## 2020-11-20 NOTE — Patient Instructions (Signed)
Exercising to Stay Healthy To become healthy and stay healthy, it is recommended that you do moderate-intensity and vigorous-intensity exercise. You can tell that you are exercising at a moderate intensity if your heart starts beating faster and you start breathing faster but can still hold a conversation. You can tell that you are exercising at a vigorous intensity if you are breathing much harder and faster and cannot hold a conversation while exercising. Exercising regularly is important. It has many health benefits, such as:  Improving overall fitness, flexibility, and endurance.  Increasing bone density.  Helping with weight control.  Decreasing body fat.  Increasing muscle strength.  Reducing stress and tension.  Improving overall health. How often should I exercise? Choose an activity that you enjoy, and set realistic goals. Your health care provider can help you make an activity plan that works for you. Exercise regularly as told by your health care provider. This may include:  Doing strength training two times a week, such as: ? Lifting weights. ? Using resistance bands. ? Push-ups. ? Sit-ups. ? Yoga.  Doing a certain intensity of exercise for a given amount of time. Choose from these options: ? A total of 150 minutes of moderate-intensity exercise every week. ? A total of 75 minutes of vigorous-intensity exercise every week. ? A mix of moderate-intensity and vigorous-intensity exercise every week. Children, pregnant women, people who have not exercised regularly, people who are overweight, and older adults may need to talk with a health care provider about what activities are safe to do. If you have a medical condition, be sure to talk with your health care provider before you start a new exercise program. What are some exercise ideas? Moderate-intensity exercise ideas include:  Walking 1 mile (1.6 km) in about 15  minutes.  Biking.  Hiking.  Golfing.  Dancing.  Water aerobics. Vigorous-intensity exercise ideas include:  Walking 4.5 miles (7.2 km) or more in about 1 hour.  Jogging or running 5 miles (8 km) in about 1 hour.  Biking 10 miles (16.1 km) or more in about 1 hour.  Lap swimming.  Roller-skating or in-line skating.  Cross-country skiing.  Vigorous competitive sports, such as football, basketball, and soccer.  Jumping rope.  Aerobic dancing.   What are some everyday activities that can help me to get exercise?  Yard work, such as: ? Pushing a lawn mower. ? Raking and bagging leaves.  Washing your car.  Pushing a stroller.  Shoveling snow.  Gardening.  Washing windows or floors. How can I be more active in my day-to-day activities?  Use stairs instead of an elevator.  Take a walk during your lunch break.  If you drive, park your car farther away from your work or school.  If you take public transportation, get off one stop early and walk the rest of the way.  Stand up or walk around during all of your indoor phone calls.  Get up, stretch, and walk around every 30 minutes throughout the day.  Enjoy exercise with a friend. Support to continue exercising will help you keep a regular routine of activity. What guidelines can I follow while exercising?  Before you start a new exercise program, talk with your health care provider.  Do not exercise so much that you hurt yourself, feel dizzy, or get very short of breath.  Wear comfortable clothes and wear shoes with good support.  Drink plenty of water while you exercise to prevent dehydration or heat stroke.  Work out until   your breathing and your heartbeat get faster. Where to find more information  U.S. Department of Health and Human Services: www.hhs.gov  Centers for Disease Control and Prevention (CDC): www.cdc.gov Summary  Exercising regularly is important. It will improve your overall fitness,  flexibility, and endurance.  Regular exercise also will improve your overall health. It can help you control your weight, reduce stress, and improve your bone density.  Do not exercise so much that you hurt yourself, feel dizzy, or get very short of breath.  Before you start a new exercise program, talk with your health care provider. This information is not intended to replace advice given to you by your health care provider. Make sure you discuss any questions you have with your health care provider. Document Revised: 08/15/2017 Document Reviewed: 07/24/2017 Elsevier Patient Education  2021 Elsevier Inc.  

## 2020-11-20 NOTE — Addendum Note (Signed)
Addended by: Chevis Pretty on: 11/20/2020 11:02 AM   Modules accepted: Orders

## 2020-11-20 NOTE — Progress Notes (Addendum)
Subjective:    Patient ID: Ashlee Hoffman, female    DOB: Sep 25, 1980, 40 y.o.   MRN: [id number]   Chief Complaint: Annual Exam    HPI: annual physical  1. Mentally disabled Lives with her mom and dad.  2. Morbid obesity ([place]) Her weight has not changed since last visit. Wt Readings from Last 3 Encounters:  11/20/20 248 lb (112.5 kg)  01/05/20 247 lb 3.2 oz (112.1 kg)  05/17/19 246 lb (111.6 kg)   BMI Readings from Last 3 Encounters:  11/20/20 43.93 kg/m  01/05/20 43.79 kg/m  05/17/19 43.58 kg/m     3. HNP (herniated nucleus pulposus), lumbar Has chronic back pain. Has ot really changed. She is on Mobic. Has an appointment with Dr. Trenton Gammon this month.    Outpatient Encounter Medications as of 11/20/2020  Medication Sig  . Acetaminophen (TYLENOL ARTHRITIS EXT RELIEF PO) Take 1-2 tablets by mouth as needed. Reported on 01/12/2016  . acyclovir ointment (ZOVIRAX) 5 % Apply 1 application topically every 2 (two) hours as needed.  Marland Kitchen ketoconazole (NIZORAL) 2 % cream Apply 1 application topically daily.  . medroxyPROGESTERone (PROVERA) 10 MG tablet Take 1 tablet (10 mg total) by mouth daily.  . meloxicam (MOBIC) 15 MG tablet Take 1 tablet (15 mg total) by mouth daily.    Past Surgical History:  Procedure Laterality Date  . LUMBAR LAMINECTOMY/DECOMPRESSION MICRODISCECTOMY N/A 05/24/2013   Procedure: LUMBAR LAMINECTOMY/DECOMPRESSION MICRODISCECTOMY 1 LEVEL LEFT LUMBAR FIVE-SACARAL-ONE;  Surgeon: Charlie Pitter, MD;  Location: [facility];  Service: Neurosurgery;  Laterality: N/A;  left    Family History  Problem Relation Age of Onset  . Hypertension Mother   . Arthritis Mother   . Diabetes Maternal Grandfather   . Hypertension Maternal Grandfather   . Heart disease Maternal Grandfather     New complaints: Mom says she has been belching a lot. Her mom has gien her some omeprazole which has helped a littlle.  Social history: Lives with her mom and dad  Controlled substance  contract: n/a    Review of Systems  Constitutional: Negative for diaphoresis.  Eyes: Negative for pain.  Respiratory: Negative for shortness of breath.   Cardiovascular: Negative for chest pain, palpitations and leg swelling.  Gastrointestinal: Negative for abdominal pain.  Endocrine: Negative for polydipsia.  Skin: Negative for rash.  Neurological: Negative for dizziness, weakness and headaches.  Hematological: Does not bruise/bleed easily.  All other systems reviewed and are negative.      Objective:   Physical Exam Vitals and nursing note reviewed.  Constitutional:      General: She is not in acute distress.    Appearance: Normal appearance. She is well-developed and well-nourished.  HENT:     Head: Normocephalic.     Nose: Nose normal.     Mouth/Throat:     Mouth: Oropharynx is clear and moist.  Eyes:     Extraocular Movements: EOM normal.     Pupils: Pupils are equal, round, and reactive to light.  Neck:     Vascular: No carotid bruit or JVD.  Cardiovascular:     Rate and Rhythm: Normal rate and regular rhythm.     Pulses: Intact distal pulses.     Heart sounds: Normal heart sounds.  Pulmonary:     Effort: Pulmonary effort is normal. No respiratory distress.     Breath sounds: Normal breath sounds. No wheezing or rales.  Chest:     Chest wall: No tenderness.  Abdominal:  General: Bowel sounds are normal. There is no distension or abdominal bruit. Aorta is normal.     Palpations: Abdomen is soft. There is no hepatomegaly, splenomegaly, mass or pulsatile mass.     Tenderness: There is no abdominal tenderness.  Musculoskeletal:        General: No edema. Normal range of motion.     Cervical back: Normal range of motion and neck supple.  Lymphadenopathy:     Cervical: No cervical adenopathy.  Skin:    General: Skin is warm and dry.  Neurological:     Mental Status: She is alert and oriented to person, place, and time.     Deep Tendon Reflexes: Reflexes are  normal and symmetric.  Psychiatric:        Mood and Affect: Mood and affect normal.        Behavior: Behavior normal.        Thought Content: Thought content normal.        Judgment: Judgment normal.    BP 113/80   Pulse 75   Temp (!) 97 F (36.1 C) (Temporal)   Resp 20   Ht 5\' 3"  (1.6 m)   Wt 248 lb (112.5 kg)   SpO2 100%   BMI 43.93 kg/m        Assessment & Plan:  Ashlee Hoffman comes in today with chief complaint of Annual Exam   Diagnosis and orders addressed:  1. Mentally disabled - medroxyPROGESTERone (PROVERA) 10 MG tablet; Take 1 tablet (10 mg total) by mouth daily.  Dispense: 90 tablet; Refill: 3  2. Morbid obesity ([place]) Discussed diet and exercise for person with BMI >25 Will recheck weight in 3-6 months  3. HNP (herniated nucleus pulposus), lumbar Keep follow up with DR. Poole  4. Gastroesophageal reflux disease without esophagitis Avoid spicy foods Do not eat 2 hours prior to bedtime - omeprazole (PRILOSEC) 40 MG capsule; Take 1 capsule (40 mg total) by mouth daily.  Dispense: 30 capsule; Refill: 3   Labs pending Health Maintenance reviewed Diet and exercise encouraged  Follow up plan: 6 months   Mary-Margaret Hassell Done, FNP

## 2020-11-21 DIAGNOSIS — M25531 Pain in right wrist: Secondary | ICD-10-CM | POA: Diagnosis not present

## 2020-11-21 LAB — CBC WITH DIFFERENTIAL/PLATELET
Basophils Absolute: 0.1 10*3/uL (ref 0.0–0.2)
Basos: 1 %
EOS (ABSOLUTE): 0.2 10*3/uL (ref 0.0–0.4)
Eos: 2 %
Hematocrit: 40.2 % (ref 34.0–46.6)
Hemoglobin: 13.4 g/dL (ref 11.1–15.9)
Immature Grans (Abs): 0 10*3/uL (ref 0.0–0.1)
Immature Granulocytes: 0 %
Lymphocytes Absolute: 2.4 10*3/uL (ref 0.7–3.1)
Lymphs: 27 %
MCH: 29.5 pg (ref 26.6–33.0)
MCHC: 33.3 g/dL (ref 31.5–35.7)
MCV: 89 fL (ref 79–97)
Monocytes Absolute: 0.6 10*3/uL (ref 0.1–0.9)
Monocytes: 6 %
Neutrophils Absolute: 5.8 10*3/uL (ref 1.4–7.0)
Neutrophils: 64 %
Platelets: 295 10*3/uL (ref 150–450)
RBC: 4.54 x10E6/uL (ref 3.77–5.28)
RDW: 12.6 % (ref 11.7–15.4)
WBC: 9 10*3/uL (ref 3.4–10.8)

## 2020-11-21 LAB — CMP14+EGFR
ALT: 34 IU/L — ABNORMAL HIGH (ref 0–32)
AST: 21 IU/L (ref 0–40)
Albumin/Globulin Ratio: 1.8 (ref 1.2–2.2)
Albumin: 4.5 g/dL (ref 3.8–4.8)
Alkaline Phosphatase: 57 IU/L (ref 44–121)
BUN/Creatinine Ratio: 11 (ref 9–23)
BUN: 9 mg/dL (ref 6–20)
Bilirubin Total: 0.3 mg/dL (ref 0.0–1.2)
CO2: 19 mmol/L — ABNORMAL LOW (ref 20–29)
Calcium: 9.5 mg/dL (ref 8.7–10.2)
Chloride: 103 mmol/L (ref 96–106)
Creatinine, Ser: 0.84 mg/dL (ref 0.57–1.00)
Globulin, Total: 2.5 g/dL (ref 1.5–4.5)
Glucose: 76 mg/dL (ref 65–99)
Potassium: 4.5 mmol/L (ref 3.5–5.2)
Sodium: 137 mmol/L (ref 134–144)
Total Protein: 7 g/dL (ref 6.0–8.5)
eGFR: 91 mL/min/{1.73_m2} (ref >59)

## 2020-11-21 LAB — THYROID PANEL WITH TSH
Free Thyroxine Index: 1.7 (ref 1.2–4.9)
T3 Uptake Ratio: 26 % (ref 24–39)
T4, Total: 6.4 ug/dL (ref 4.5–12.0)
TSH: 1.13 u[IU]/mL (ref 0.450–4.500)

## 2020-11-21 LAB — LIPID PANEL
Chol/HDL Ratio: 4.9 ratio — ABNORMAL HIGH (ref 0.0–4.4)
Cholesterol, Total: 165 mg/dL (ref 100–199)
HDL: 34 mg/dL — ABNORMAL LOW (ref >39)
LDL Chol Calc (NIH): 103 mg/dL — ABNORMAL HIGH (ref 0–99)
Triglycerides: 157 mg/dL — ABNORMAL HIGH (ref 0–149)
VLDL Cholesterol Cal: 28 mg/dL (ref 5–40)

## 2020-11-30 DIAGNOSIS — M5126 Other intervertebral disc displacement, lumbar region: Secondary | ICD-10-CM | POA: Diagnosis not present

## 2020-11-30 DIAGNOSIS — M5124 Other intervertebral disc displacement, thoracic region: Secondary | ICD-10-CM | POA: Diagnosis not present

## 2020-12-11 ENCOUNTER — Ambulatory Visit (INDEPENDENT_AMBULATORY_CARE_PROVIDER_SITE_OTHER): Payer: Medicare Other | Admitting: Podiatry

## 2020-12-11 ENCOUNTER — Other Ambulatory Visit: Payer: Self-pay

## 2020-12-11 DIAGNOSIS — B351 Tinea unguium: Secondary | ICD-10-CM

## 2020-12-11 DIAGNOSIS — M7751 Other enthesopathy of right foot: Secondary | ICD-10-CM | POA: Diagnosis not present

## 2020-12-11 DIAGNOSIS — M25571 Pain in right ankle and joints of right foot: Secondary | ICD-10-CM | POA: Diagnosis not present

## 2020-12-11 DIAGNOSIS — G8929 Other chronic pain: Secondary | ICD-10-CM | POA: Diagnosis not present

## 2020-12-11 DIAGNOSIS — Q828 Other specified congenital malformations of skin: Secondary | ICD-10-CM

## 2020-12-11 MED ORDER — METHYLPREDNISOLONE 4 MG PO TBPK: ORAL_TABLET | ORAL | 0 refills | Status: DC

## 2020-12-12 NOTE — Progress Notes (Signed)
Subjective: 40 year old female presents the office today for follow-up evaluation of right ankle pain.  She states that she still been discomfort but the power step inserts are comfortable.  She takes Mobic every day.  No recent or falls or changes.  She previously was in physical therapy we stop this temporarily until the MRI was performed given the ongoing discomfort.  I did place discussed the MRI with her mom.  She also presents to have the nails trimmed there is becoming thickened elongated she cannot do them herself.  Denies any systemic complaints such as fevers, chills, nausea, vomiting. No acute changes since last appointment, and no other complaints at this time.   Objective: NAD DP/PT pulses palpable bilaterally, CRT less than 3 seconds Nails are hypertrophic, dystrophic, brittle, discolored, elongated 10. No surrounding redness or drainage. Tenderness nails 1-5 bilaterally.  Hyperkeratotic lesions medial hallux without any underlying ulceration drainage or signs of infection. There is mild tenderness palpation along the right ankle.  Appears to be most on the anterior lateral aspect of the ankle and mildly along the course the peroneal tendon but there is minimal tenderness upon palpation there is no edema.  No area of pinpoint tenderness.  MMT 5/5. No pain with calf compression, swelling, warmth, erythema  Assessment: Chronic right ankle pain, tendinitis; symptomatic onychomycosis, hyperkeratotic lesion  Plan: -All treatment options discussed with the patient including all alternatives, risks, complications.  -She is to continue the power step inserts.  We will start a Medrol Dosepak.  Hold off on meloxicam for now. -We will restart physical therapy.  We can try iontophoresis and other treatments as well. -Debrided nails x10 without any complications or bleeding -Debrided hyperkeratotic lesions x2 without any  Trula Slade DPM

## 2020-12-18 DIAGNOSIS — M5414 Radiculopathy, thoracic region: Secondary | ICD-10-CM | POA: Diagnosis not present

## 2020-12-19 ENCOUNTER — Ambulatory Visit: Payer: Medicare Other | Attending: Podiatry | Admitting: Physical Therapy

## 2020-12-19 ENCOUNTER — Encounter: Payer: Self-pay | Admitting: Physical Therapy

## 2020-12-19 ENCOUNTER — Other Ambulatory Visit: Payer: Self-pay

## 2020-12-19 DIAGNOSIS — M25571 Pain in right ankle and joints of right foot: Secondary | ICD-10-CM | POA: Insufficient documentation

## 2020-12-19 DIAGNOSIS — M25572 Pain in left ankle and joints of left foot: Secondary | ICD-10-CM | POA: Insufficient documentation

## 2020-12-19 DIAGNOSIS — M25561 Pain in right knee: Secondary | ICD-10-CM | POA: Diagnosis not present

## 2020-12-19 DIAGNOSIS — G8929 Other chronic pain: Secondary | ICD-10-CM | POA: Diagnosis not present

## 2020-12-19 DIAGNOSIS — M6281 Muscle weakness (generalized): Secondary | ICD-10-CM | POA: Diagnosis not present

## 2020-12-19 NOTE — Therapy (Signed)
[facility] [place], [place], [address] Phone: [phone]   Fax:  [phone]  Physical Therapy Evaluation  Patient Details  Name: Ashlee Hoffman MRN: [id number] Date of Birth: June 10, 1981 Referring Provider (PT): Celesta Gentile, DPM   Encounter Date: 12/19/2020   PT End of Session - 12/19/20 1816    Visit Number 1    Number of Visits 12    Date for PT Re-Evaluation 02/13/21    Authorization Type [employer], medicaid secondary; progress note every 10th visit    PT Start Time 1300    PT Stop Time 1345    PT Time Calculation (min) 45 min    Activity Tolerance Patient tolerated treatment well    Behavior During Therapy [facility] for tasks assessed/performed           Past Medical History:  Diagnosis Date  . 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 Mental disability   . Shortness of breath    with exertion    Past Surgical History:  Procedure Laterality Date  . LUMBAR LAMINECTOMY/DECOMPRESSION MICRODISCECTOMY N/A 05/24/2013   Procedure: LUMBAR LAMINECTOMY/DECOMPRESSION MICRODISCECTOMY 1 LEVEL LEFT LUMBAR FIVE-SACARAL-ONE;  Surgeon: Charlie Pitter, MD;  Location: [facility];  Service: Neurosurgery;  Laterality: N/A;  left    There were no vitals filed for this visit.    Subjective Assessment - 12/19/20 1810    Subjective COVID-19 screening performed upon arrival. Patient arrives to physical therapy with R ankle pain that has been ongoing for the past year. Patient reports having pain with walking on the lateral aspect of the right ankle. Patient has orthotics with reports of improvements in walking but still with pain. Patient reports pain averages between 3-6/10 on the [employer] Scale. Patient performs HEP but not on a consistent basis per mother's report. Patient utilizes ice machine, TENs unit, and pain medication for pain relief. Patient's goals are to decrease pain and walk with less pain    Patient is accompained by:  Family member   Mother   Pertinent History mental disability, lumbar laminectomy 2014, wrist surgery 2020    Limitations Walking;Standing;House hold activities    Diagnostic tests MRI: see imaging tab    Patient Stated Goals stop pain    Currently in Pain? Yes    Pain Score 4    3-4/10 on [employer] Faces Scale   Pain Location Ankle    Pain Orientation Right    Pain Descriptors / Indicators Sore    Pain Type Chronic pain    Pain Onset More than a month ago    Pain Frequency Constant    Aggravating Factors  walking    Pain Relieving Factors ice, TENs machine, medication tramadol    Effect of Pain on Daily Activities pain with walking              [facility] PT Assessment - 12/19/20 0001      Assessment   Medical Diagnosis Tendonitis of ankle, right    Referring Provider (PT) Celesta Gentile, DPM    Onset Date/Surgical Date --   ongoing   Prior Therapy yes      Precautions   Precautions None      Restrictions   Weight Bearing Restrictions No      Balance Screen   Has the patient fallen in the past 6 months No    Has the patient had a decrease in activity level because of a fear of falling?  No  Is the patient reluctant to leave their home because of a fear of falling?  No      Home Environment   Living Environment Private residence    Living Arrangements Parent    Type of [place] Access Stairs to enter    Entrance Stairs-Number of Steps 4    Entrance Stairs-Rails Left    Home Layout Two level      Observation/Other Assessments-Edema    Edema Figure 8      Figure 8 Edema   Figure 8 - Right  51.3    Figure 8 - Left  51.0      ROM / Strength   AROM / PROM / Strength Strength;AROM;PROM      AROM   Overall AROM  Deficits    AROM Assessment Site Ankle    Right/Left Ankle Right    Right Ankle Dorsiflexion 2    Right Ankle Plantar Flexion 42    Right Ankle Inversion 32    Right Ankle Eversion 12      PROM   Overall PROM  Within functional limits for  tasks performed    PROM Assessment Site Ankle    Right/Left Ankle Right    Right Ankle Dorsiflexion 10   4 DF   Right Ankle Plantar Flexion 52    Right Ankle Inversion 40    Right Ankle Eversion 22      Strength   Overall Strength Deficits    Overall Strength Comments 10 bilateral heel raises performed but with ankle inversion bilaterally    Strength Assessment Site Ankle    Right/Left Ankle Right    Right Ankle Dorsiflexion 4-/5    Right Ankle Plantar Flexion 3/5    Right Ankle Inversion 3+/5    Right Ankle Eversion 3+/5      Palpation   Palpation comment mild tenderness to palpation to L lateral malleoli. No reports of pain or tenderness to calf, achilles or plantar aspect of R foot      Ambulation/Gait   Gait Pattern Step-through pattern;Decreased weight shift to right;Abducted- right;Antalgic                      Objective measurements completed on examination: See above findings.               PT Education - 12/19/20 1815    Education Details heel raises toe raises, inversion pillow squeeze, eversion against resistance, yellow theraband    Person(s) Educated Patient;Parent(s)    Methods Explanation;Demonstration;Handout    Comprehension Verbalized understanding               PT Long Term Goals - 12/19/20 2059      PT LONG TERM GOAL #1   Title Patient will be modified independent with HEP with assistance from mother    Time 8    Period Weeks    Status New      PT LONG TERM GOAL #2   Title Patient will demonstrate 4/5 or greater right ankle MMT to improve stability during functional tasks.    Time 8    Period Weeks    Status New      PT LONG TERM GOAL #3   Title Patient will report ability to walk community distances with right ankle pain less than or equal to 2-3/10 on Faces Pain Scale.    Time 8    Period Weeks    Status New  PT LONG TERM GOAL #4   Title Patient will report ability to perform ADLs and home activities with  right ankle pain less than or equal to 2-3/10 on Faces Pain Scale.    Time 8    Period Weeks    Status New                  Plan - 12/19/20 1818    Clinical Impression Statement Patient is a 40 year old female who presents to physical therapy with her mother with right lateral ankle pain, difficulty walking, and decreased R ankle MMT that has been ongoing for about a year. Patient noted with tenderness to palpation to lateral malleoli but denied tenderness in calf, achilles tendon or heel. Patient with minimal edema in comparison to L ankle. Patient's mother, patient, and PT discussed HEP and plan of care for therapy to which they reported understanding. Patient would benefit from skilled physical therapy to address deficits and goals.    Personal Factors and Comorbidities Comorbidity 2    Comorbidities mental disability, lumbar laminectomy 2014, wrist surgery 2020    Examination-Activity Limitations Locomotion Level;Stairs;Squat;Stand    Stability/Clinical Decision Making Stable/Uncomplicated    Clinical Decision Making Low    Rehab Potential Fair    PT Frequency 1x / week   1-2x/week   PT Duration 8 weeks    PT Treatment/Interventions ADLs/Self Care Home Management;Iontophoresis 4mg /ml Dexamethasone;Moist Heat;Ultrasound;Electrical Stimulation;Cryotherapy;Therapeutic exercise;Balance training;Neuromuscular re-education;Manual techniques;Passive range of motion;Therapeutic activities;Functional mobility training;Stair training;Gait training;Vasopneumatic Device;Taping    PT Next Visit Plan nustep or bike, ankle stability and strengthening, balance activities. iontophoresis once certification is signed.    PT Home Exercise Plan see patient education section    Consulted and Agree with Plan of Care Patient           Patient will benefit from skilled therapeutic intervention in order to improve the following deficits and impairments:  Abnormal gait,Decreased range of motion,Difficulty  walking,Decreased activity tolerance,Decreased balance,Decreased strength,Pain  Visit Diagnosis: Pain in right ankle and joints of right foot - Plan: PT plan of care cert/re-cert  Muscle weakness (generalized) - Plan: PT plan of care cert/re-cert     Problem List Patient Active Problem List   Diagnosis Date Noted  . Wrist joint effusion 05/01/2018  . Morbid obesity ([place]) 07/04/2015  . HNP (herniated nucleus pulposus), lumbar 05/24/2013  . Mentally disabled 01/21/2013    Gabriela Eves, PT, DPT 12/19/2020, 9:25 PM  [facility] [address] [place], [place], [address] Phone: [phone]   Fax:  [phone]  Name: Ashlee Hoffman MRN: [id number] Date of Birth: [birth year]

## 2020-12-21 ENCOUNTER — Ambulatory Visit: Payer: Medicare Other | Admitting: *Deleted

## 2020-12-21 ENCOUNTER — Other Ambulatory Visit: Payer: Self-pay

## 2020-12-21 DIAGNOSIS — M25561 Pain in right knee: Secondary | ICD-10-CM

## 2020-12-21 DIAGNOSIS — M25571 Pain in right ankle and joints of right foot: Secondary | ICD-10-CM | POA: Diagnosis not present

## 2020-12-21 DIAGNOSIS — G8929 Other chronic pain: Secondary | ICD-10-CM | POA: Diagnosis not present

## 2020-12-21 DIAGNOSIS — M6281 Muscle weakness (generalized): Secondary | ICD-10-CM

## 2020-12-21 DIAGNOSIS — M25572 Pain in left ankle and joints of left foot: Secondary | ICD-10-CM | POA: Diagnosis not present

## 2020-12-21 NOTE — Therapy (Signed)
[facility] [place], [place], [address] Phone: [phone]   Fax:  [phone]  Physical Therapy Treatment  Patient Details  Name: Ashlee Hoffman MRN: [id number] Date of Birth: [birth year] Referring Provider (PT): Celesta Gentile, DPM   Encounter Date: 12/21/2020   PT End of Session - 12/21/20 1450    Visit Number 2    Number of Visits 12    Date for PT Re-Evaluation 02/13/21    Authorization Type [employer], medicaid secondary; progress note every 10th visit    PT Start Time 1445    PT Stop Time 1525    PT Time Calculation (min) 40 min           Past Medical History:  Diagnosis Date  . 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 Mental disability   . Shortness of breath    with exertion    Past Surgical History:  Procedure Laterality Date  . LUMBAR LAMINECTOMY/DECOMPRESSION MICRODISCECTOMY N/A 05/24/2013   Procedure: LUMBAR LAMINECTOMY/DECOMPRESSION MICRODISCECTOMY 1 LEVEL LEFT LUMBAR FIVE-SACARAL-ONE;  Surgeon: Charlie Pitter, MD;  Location: [place] [facility];  Service: Neurosurgery;  Laterality: N/A;  left    There were no vitals filed for this visit.                      [facility] Adult PT Treatment/Exercise - 12/21/20 0001      Exercises   Exercises Ankle      Modalities   Modalities Vasopneumatic      Vasopneumatic   Number Minutes Vasopneumatic  10 minutes    Vasopnuematic Location  Ankle    Vasopneumatic Pressure Low    Vasopneumatic Temperature  34/pain      Ankle Exercises: Standing   SLS Balance on RT foot    Rocker Board 5 minutes    Heel Raises Both;20 reps    Toe Raise 20 reps   both     Ankle Exercises: Seated   Other Seated Ankle Exercises ankle isolator 1# DF 3x10, 1# eversion 3x10 in sitting                       PT Long Term Goals - 12/19/20 2059      PT LONG TERM GOAL #1   Title Patient will be modified independent with HEP with assistance from mother    Time 8     Period Weeks    Status New      PT LONG TERM GOAL #2   Title Patient will demonstrate 4/5 or greater right ankle MMT to improve stability during functional tasks.    Time 8    Period Weeks    Status New      PT LONG TERM GOAL #3   Title Patient will report ability to walk community distances with right ankle pain less than or equal to 2-3/10 on Faces Pain Scale.    Time 8    Period Weeks    Status New      PT LONG TERM GOAL #4   Title Patient will report ability to perform ADLs and home activities with right ankle pain less than or equal to 2-3/10 on Faces Pain Scale.    Time 8    Period Weeks    Status New                 Plan - 12/21/20 1447    Clinical Impression Statement Pt arrrived today doing a little better  with minimal pain in RT ankle. Pt was 15 mins late.  Rx focused on sitting and standing  pain free exs and balance act.'s.  Eval not signed yet for Ionto patch. Vaso used and tolerated well    Personal Factors and Comorbidities Comorbidity 2    Comorbidities mental disability, lumbar laminectomy 2014, wrist surgery 2020    Rehab Potential Fair    PT Frequency 1x / week    PT Duration 8 weeks    PT Treatment/Interventions ADLs/Self Care Home Management;Iontophoresis 4mg /ml Dexamethasone;Moist Heat;Ultrasound;Electrical Stimulation;Cryotherapy;Therapeutic exercise;Balance training;Neuromuscular re-education;Manual techniques;Passive range of motion;Therapeutic activities;Functional mobility training;Stair training;Gait training;Vasopneumatic Device;Taping    PT Next Visit Plan nustep or bike, ankle stability and strengthening, balance activities. iontophoresis once certification is signed.    PT Home Exercise Plan see patient education section           Patient will benefit from skilled therapeutic intervention in order to improve the following deficits and impairments:  Abnormal gait,Decreased range of motion,Difficulty walking,Decreased activity  tolerance,Decreased balance,Decreased strength,Pain  Visit Diagnosis: Pain in right ankle and joints of right foot  Muscle weakness (generalized)  Pain in left ankle and joints of left foot  Chronic pain of right knee     Problem List Patient Active Problem List   Diagnosis Date Noted  . Wrist joint effusion 05/01/2018  . Morbid obesity ([place]) 07/04/2015  . HNP (herniated nucleus pulposus), lumbar 05/24/2013  . Mentally disabled 01/21/2013    Ashlee Hoffman,CHRIS, PTA 12/21/2020, 3:54 PM  [facility] [address] [place], [place], [address] Phone: [phone]   Fax:  [phone]  Name: Ashlee Hoffman MRN: [id number] Date of Birth: 1981/01/24

## 2020-12-25 ENCOUNTER — Encounter: Payer: Self-pay | Admitting: Physical Therapy

## 2020-12-25 ENCOUNTER — Other Ambulatory Visit: Payer: Self-pay

## 2020-12-25 ENCOUNTER — Ambulatory Visit: Payer: Medicare Other | Admitting: Physical Therapy

## 2020-12-25 ENCOUNTER — Other Ambulatory Visit: Payer: Self-pay | Admitting: Nurse Practitioner

## 2020-12-25 DIAGNOSIS — M25561 Pain in right knee: Secondary | ICD-10-CM

## 2020-12-25 DIAGNOSIS — M6281 Muscle weakness (generalized): Secondary | ICD-10-CM

## 2020-12-25 DIAGNOSIS — M25571 Pain in right ankle and joints of right foot: Secondary | ICD-10-CM

## 2020-12-25 DIAGNOSIS — G8929 Other chronic pain: Secondary | ICD-10-CM | POA: Diagnosis not present

## 2020-12-25 DIAGNOSIS — M25572 Pain in left ankle and joints of left foot: Secondary | ICD-10-CM | POA: Diagnosis not present

## 2020-12-25 NOTE — Therapy (Signed)
[facility] [place], [place], [address] Phone: [phone]   Fax:  [phone]  Physical Therapy Treatment  Patient Details  Name: Ashlee Hoffman MRN: [id number] Date of Birth: 11/25/1980 Referring Provider (PT): Celesta Gentile, DPM   Encounter Date: 12/25/2020   PT End of Session - 12/25/20 1014    Visit Number 3    Number of Visits 12    Date for PT Re-Evaluation 02/13/21    Authorization Type [employer] healthcare, medicaid secondary; progress note every 10th visit    PT Start Time 0908    PT Stop Time 0956    PT Time Calculation (min) 48 min    Activity Tolerance Patient tolerated treatment well    Behavior During Therapy [facility] for tasks assessed/performed           Past Medical History:  Diagnosis Date  . 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 Mental disability   . Shortness of breath    with exertion    Past Surgical History:  Procedure Laterality Date  . LUMBAR LAMINECTOMY/DECOMPRESSION MICRODISCECTOMY N/A 05/24/2013   Procedure: LUMBAR LAMINECTOMY/DECOMPRESSION MICRODISCECTOMY 1 LEVEL LEFT LUMBAR FIVE-SACARAL-ONE;  Surgeon: Charlie Pitter, MD;  Location: [place];  Service: Neurosurgery;  Laterality: N/A;  left    There were no vitals filed for this visit.   Subjective Assessment - 12/25/20 0908    Subjective COVID-19 screening performed upon arrival. "It hurts a little bit."    Pertinent History mental disability, lumbar laminectomy 2014, wrist surgery 2020    Limitations Walking;Standing;House hold activities    Diagnostic tests MRI: see imaging tab    Patient Stated Goals stop pain    Currently in Pain? Yes    Pain Score --   No pain score provided   Pain Location Ankle    Pain Orientation Right    Pain Descriptors / Indicators Discomfort    Pain Type Chronic pain    Pain Onset More than a month ago    Pain Frequency Intermittent              [facility] PT Assessment - 12/25/20 0001      Assessment    Medical Diagnosis Tendonitis of ankle, right    Referring Provider (PT) Celesta Gentile, DPM    Prior Therapy yes      Precautions   Precautions None      Restrictions   Weight Bearing Restrictions No                         [facility] Adult PT Treatment/Exercise - 12/25/20 0001      Modalities   Modalities Iontophoresis;Vasopneumatic      Iontophoresis   Type of Iontophoresis Dexamethasone    Location R anterior ankle    Dose 1.0 ml    Time 8      Vasopneumatic   Number Minutes Vasopneumatic  10 minutes    Vasopnuematic Location  Ankle    Vasopneumatic Pressure Low    Vasopneumatic Temperature  34/pain      Ankle Exercises: Aerobic   Recumbent Bike L1 x10 min, seat 4      Ankle Exercises: Standing   SLS RLE SLS on balance beam    Rocker Board 3 minutes    Heel Raises Both;20 reps    Toe Raise 20 reps    Other Standing Ankle Exercises towel crunch in standing x20 reps    Other Standing Ankle Exercises sidestepping on balance  beam x3 RT      Ankle Exercises: Seated   Other Seated Ankle Exercises R ankle isolator DF/PF, Inv/Ev, CW and CCW circles x20 reps                       PT Long Term Goals - 12/19/20 2059      PT LONG TERM GOAL #1   Title Patient will be modified independent with HEP with assistance from mother    Time 8    Period Weeks    Status New      PT LONG TERM GOAL #2   Title Patient will demonstrate 4/5 or greater right ankle MMT to improve stability during functional tasks.    Time 8    Period Weeks    Status New      PT LONG TERM GOAL #3   Title Patient will report ability to walk community distances with right ankle pain less than or equal to 2-3/10 on Faces Pain Scale.    Time 8    Period Weeks    Status New      PT LONG TERM GOAL #4   Title Patient will report ability to perform ADLs and home activities with right ankle pain less than or equal to 2-3/10 on Faces Pain Scale.    Time 8    Period Weeks    Status  New                 Plan - 12/25/20 1017    Clinical Impression Statement Patient presented in clinic with reports of "a little" R anterior ankle pain along anterior tib tendon. Patient progressed through standing ankle/intrinsic foot strengthening without complaints of pain. Patient limited to max 10 sec holds on RLE on balance beam. Normal vasopneumatic response noted following removal of the modality. Iontophoresis patch donned to R anterior ankle with written paper and instructions for her mother.    Personal Factors and Comorbidities Comorbidity 2    Comorbidities mental disability, lumbar laminectomy 2014, wrist surgery 2020    Examination-Activity Limitations Locomotion Level;Stairs;Squat;Stand    Stability/Clinical Decision Making Stable/Uncomplicated    Rehab Potential Fair    PT Frequency 1x / week    PT Duration 8 weeks    PT Treatment/Interventions ADLs/Self Care Home Management;Iontophoresis 4mg /ml Dexamethasone;Moist Heat;Ultrasound;Electrical Stimulation;Cryotherapy;Therapeutic exercise;Balance training;Neuromuscular re-education;Manual techniques;Passive range of motion;Therapeutic activities;Functional mobility training;Stair training;Gait training;Vasopneumatic Device;Taping    PT Next Visit Plan nustep or bike, ankle stability and strengthening, balance activities. iontophoresis once certification is signed.    PT Home Exercise Plan see patient education section    Consulted and Agree with Plan of Care Patient           Patient will benefit from skilled therapeutic intervention in order to improve the following deficits and impairments:  Abnormal gait,Decreased range of motion,Difficulty walking,Decreased activity tolerance,Decreased balance,Decreased strength,Pain  Visit Diagnosis: Pain in right ankle and joints of right foot  Muscle weakness (generalized)     Problem List Patient Active Problem List   Diagnosis Date Noted  . Wrist joint effusion  05/01/2018  . Morbid obesity ([place]) 07/04/2015  . HNP (herniated nucleus pulposus), lumbar 05/24/2013  . Mentally disabled 01/21/2013    Standley Brooking, PTA 12/25/2020, 10:24 AM  [facility] [place], [place], [address] Phone: [phone]   Fax:  [phone]  Name: Ashlee Hoffman MRN: [id number] Date of Birth: December 12, 1980

## 2020-12-27 ENCOUNTER — Encounter: Payer: Self-pay | Admitting: Physical Therapy

## 2020-12-27 ENCOUNTER — Other Ambulatory Visit: Payer: Self-pay

## 2020-12-27 ENCOUNTER — Ambulatory Visit: Payer: Medicare Other | Admitting: Physical Therapy

## 2020-12-27 DIAGNOSIS — M25571 Pain in right ankle and joints of right foot: Secondary | ICD-10-CM | POA: Diagnosis not present

## 2020-12-27 DIAGNOSIS — M6281 Muscle weakness (generalized): Secondary | ICD-10-CM | POA: Diagnosis not present

## 2020-12-27 DIAGNOSIS — G8929 Other chronic pain: Secondary | ICD-10-CM | POA: Diagnosis not present

## 2020-12-27 DIAGNOSIS — M25572 Pain in left ankle and joints of left foot: Secondary | ICD-10-CM | POA: Diagnosis not present

## 2020-12-27 DIAGNOSIS — M25561 Pain in right knee: Secondary | ICD-10-CM | POA: Diagnosis not present

## 2020-12-27 NOTE — Therapy (Signed)
[facility] [place], [place], [address] Phone: [phone]   Fax:  [phone]  Physical Therapy Treatment  Patient Details  Name: Ashlee Hoffman MRN: [id number] Date of Birth: 02-11-1981 Referring Provider (PT): Ashlee Hoffman, DPM   Encounter Date: 12/27/2020   PT End of Session - 12/27/20 1021    Visit Number 4    Number of Visits 12    Date for PT Re-Evaluation 02/13/21    Authorization Type [employer], medicaid secondary; progress note every 10th visit    PT Start Time 0901    PT Stop Time 0949    PT Time Calculation (min) 48 min    Activity Tolerance Patient tolerated treatment well    Behavior During Therapy [facility] for tasks assessed/performed           Past Medical History:  Diagnosis Date  . 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 Mental disability   . Shortness of breath    with exertion    Past Surgical History:  Procedure Laterality Date  . LUMBAR LAMINECTOMY/DECOMPRESSION MICRODISCECTOMY N/A 05/24/2013   Procedure: LUMBAR LAMINECTOMY/DECOMPRESSION MICRODISCECTOMY 1 LEVEL LEFT LUMBAR FIVE-SACARAL-ONE;  Surgeon: Ashlee Pitter, MD;  Location: [place] [facility];  Service: Neurosurgery;  Laterality: N/A;  left    There were no vitals filed for this visit.   Subjective Assessment - 12/27/20 0922    Subjective COVID-19 screening performed upon arrival. "They're there." Reports pain earlier this morning.    Pertinent History mental disability, lumbar laminectomy 2014, wrist surgery 2020    Limitations Walking;Standing;House hold activities    Diagnostic tests MRI: see imaging tab    Patient Stated Goals stop pain    Currently in Pain? Other (Comment)   No pain assessment provided             [facility] PT Assessment - 12/27/20 0001      Assessment   Medical Diagnosis Tendonitis of ankle, right    Referring Provider (PT) Ashlee Hoffman, DPM    Prior Therapy yes      Precautions   Precautions None       Restrictions   Weight Bearing Restrictions No                         [facility] Adult PT Treatment/Exercise - 12/27/20 0001      Exercises   Exercises Ankle      Modalities   Modalities Iontophoresis      Iontophoresis   Type of Iontophoresis Dexamethasone    Location R anterior ankle    Dose 1.0 ml    Time 8      Ankle Exercises: Aerobic   Recumbent Bike L4 x10 min, seat 4      Ankle Exercises: Machines for Strengthening   Cybex Leg Press 1 pl, seat 7 x20 reps      Ankle Exercises: Standing   SLS 3x30 sec   floor   Rocker Board 3 minutes    Heel Raises Both;20 reps   normal and with ball squeeze   Toe Raise 20 reps    Other Standing Ankle Exercises R forward lunge x15 reps    Other Standing Ankle Exercises sidestepping on balance beam x3 RT, sidestepping with green theraband at ankles x3 RT      Ankle Exercises: Seated   Towel Crunch Other (comment)   towel crunch with ankle DF   Other Seated Ankle Exercises R ankle isolator DF/PF, Inv/Ev, CW and  CCW circles x20 reps      Ankle Exercises: Supine   Other Supine Ankle Exercises Bridge with ankle DF x15 reps                       PT Long Term Goals - 12/19/20 2059      PT LONG TERM GOAL #1   Title Patient will be modified independent with HEP with assistance from mother    Time 8    Period Weeks    Status New      PT LONG TERM GOAL #2   Title Patient will demonstrate 4/5 or greater right ankle MMT to improve stability during functional tasks.    Time 8    Period Weeks    Status New      PT LONG TERM GOAL #3   Title Patient will report ability to walk community distances with right ankle pain less than or equal to 2-3/10 on Faces Pain Scale.    Time 8    Period Weeks    Status New      PT LONG TERM GOAL #4   Title Patient will report ability to perform ADLs and home activities with right ankle pain less than or equal to 2-3/10 on Faces Pain Scale.    Time 8    Period Weeks     Status New                 Plan - 12/27/20 1022    Clinical Impression Statement Patient presented in clinic with reports of no R ankle pain upon arrival. Patient did experience R ankle pain earlier this morning for unknown reason per patient report. Patient progressed through more strengthening and weightbearing exercises. Patient did report some fatigue and discomfort with ankle isolator. Iontophoresis patch donned to anterior R ankle with patient's mother educated to remove in 4 hours (2 pm.)    Personal Factors and Comorbidities Comorbidity 2    Comorbidities mental disability, lumbar laminectomy 2014, wrist surgery 2020    Examination-Activity Limitations Locomotion Level;Stairs;Squat;Stand    Stability/Clinical Decision Making Stable/Uncomplicated    Rehab Potential Fair    PT Frequency 1x / week    PT Duration 8 weeks    PT Treatment/Interventions ADLs/Self Care Home Management;Iontophoresis 4mg /ml Dexamethasone;Moist Heat;Ultrasound;Electrical Stimulation;Cryotherapy;Therapeutic exercise;Balance training;Neuromuscular re-education;Manual techniques;Passive range of motion;Therapeutic activities;Functional mobility training;Stair training;Gait training;Vasopneumatic Device;Taping    PT Next Visit Plan nustep or bike, ankle stability and strengthening, balance activities. iontophoresis once certification is signed.    PT Home Exercise Plan see patient education section    Consulted and Agree with Plan of Care Patient           Patient will benefit from skilled therapeutic intervention in order to improve the following deficits and impairments:  Abnormal gait,Decreased range of motion,Difficulty walking,Decreased activity tolerance,Decreased balance,Decreased strength,Pain  Visit Diagnosis: Pain in right ankle and joints of right foot  Muscle weakness (generalized)     Problem List Patient Active Problem List   Diagnosis Date Noted  . Wrist joint effusion 05/01/2018  .  Morbid obesity ([name]) 07/04/2015  . HNP (herniated nucleus pulposus), lumbar 05/24/2013  . Mentally disabled 01/21/2013    Ashlee Hoffman, PTA 12/27/2020, 10:45 AM  [facility] [place], [place], [address] Phone: [phone]   Fax:  [phone]  Name: Ashlee Hoffman MRN: [id number] Date of Birth: March 11, 1981

## 2021-01-01 ENCOUNTER — Ambulatory Visit: Payer: Medicare Other | Admitting: Physical Therapy

## 2021-01-01 ENCOUNTER — Encounter: Payer: Self-pay | Admitting: Physical Therapy

## 2021-01-01 ENCOUNTER — Other Ambulatory Visit: Payer: Self-pay

## 2021-01-01 DIAGNOSIS — M25572 Pain in left ankle and joints of left foot: Secondary | ICD-10-CM | POA: Diagnosis not present

## 2021-01-01 DIAGNOSIS — M6281 Muscle weakness (generalized): Secondary | ICD-10-CM | POA: Diagnosis not present

## 2021-01-01 DIAGNOSIS — M25571 Pain in right ankle and joints of right foot: Secondary | ICD-10-CM

## 2021-01-01 DIAGNOSIS — M25561 Pain in right knee: Secondary | ICD-10-CM | POA: Diagnosis not present

## 2021-01-01 DIAGNOSIS — G8929 Other chronic pain: Secondary | ICD-10-CM | POA: Diagnosis not present

## 2021-01-01 NOTE — Therapy (Signed)
[facility] [place], [place], [address] Phone: [phone]   Fax:  [phone]  Physical Therapy Treatment  Patient Details  Name: Ashlee Hoffman MRN: [id number] Date of Birth: [birth year] Referring Provider (PT): Celesta Gentile, DPM   Encounter Date: 01/01/2021   PT End of Session - 01/01/21 1003    Visit Number 5    Number of Visits 12    Date for PT Re-Evaluation 02/13/21    Authorization Type [employer], medicaid secondary; progress note every 10th visit    PT Start Time 0905    PT Stop Time 0952    PT Time Calculation (min) 47 min    Activity Tolerance Patient tolerated treatment well    Behavior During Therapy [facility] for tasks assessed/performed           Past Medical History:  Diagnosis Date  . 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 Mental disability   . Shortness of breath    with exertion    Past Surgical History:  Procedure Laterality Date  . LUMBAR LAMINECTOMY/DECOMPRESSION MICRODISCECTOMY N/A 05/24/2013   Procedure: LUMBAR LAMINECTOMY/DECOMPRESSION MICRODISCECTOMY 1 LEVEL LEFT LUMBAR FIVE-SACARAL-ONE;  Surgeon: Charlie Pitter, MD;  Location: [place] [facility];  Service: Neurosurgery;  Laterality: N/A;  left    There were no vitals filed for this visit.   Subjective Assessment - 01/01/21 1000    Subjective COVID-19 screening performed upon arrival. Mother reports that she fell on the affected ankle while stacking wood yesterday. Used ice on her ankle last night and states that if they are in the wood working on their land, her mother donns an ankle brace.    Patient is accompained by: Family member   Mother, Ashlee Hoffman   Pertinent History mental disability, lumbar laminectomy 2014, wrist surgery 2020    Limitations Walking;Standing;House hold activities    Diagnostic tests MRI: see imaging tab    Patient Stated Goals stop pain    Currently in Pain? Other (Comment)   No pain assessment provided             [facility]  PT Assessment - 01/01/21 0001      Assessment   Medical Diagnosis Tendonitis of ankle, right    Referring Provider (PT) Celesta Gentile, DPM    Prior Therapy yes      Precautions   Precautions None      Restrictions   Weight Bearing Restrictions No                         [facility] Adult PT Treatment/Exercise - 01/01/21 0001      Modalities   Modalities Vasopneumatic;Iontophoresis      Iontophoresis   Type of Iontophoresis Dexamethasone    Location R anterior ankle    Dose 1.0 ml    Time 8      Vasopneumatic   Number Minutes Vasopneumatic  10 minutes    Vasopnuematic Location  Ankle    Vasopneumatic Pressure Low    Vasopneumatic Temperature  34/pain      Ankle Exercises: Aerobic   Recumbent Bike L4 x15 min, seat 4      Ankle Exercises: Standing   Heel Raises Both;20 reps    Toe Raise 20 reps      Ankle Exercises: Seated   Ankle Circles/Pumps AROM;Both;20 reps    Other Seated Ankle Exercises R ankle 3D green theraband x30 reps each  PT Long Term Goals - 01/01/21 1150      PT LONG TERM GOAL #1   Title Patient will be modified independent with HEP with assistance from mother    Time 8    Period Weeks    Status On-going      PT LONG TERM GOAL #2   Title Patient will demonstrate 4/5 or greater right ankle MMT to improve stability during functional tasks.    Time 8    Period Weeks    Status On-going      PT LONG TERM GOAL #3   Title Patient will report ability to walk community distances with right ankle pain less than or equal to 2-3/10 on Faces Pain Scale.    Time 8    Period Weeks    Status On-going      PT LONG TERM GOAL #4   Title Patient will report ability to perform ADLs and home activities with right ankle pain less than or equal to 2-3/10 on Faces Pain Scale.    Time 8    Period Weeks    Status On-going                 Plan - 01/01/21 1028    Clinical Impression Statement Patient presented  in clinic with her mother to report her fall. Patient's mother reports their use of ice at home after the fall. No ecchymosis noted surrounding R ankle and no abrasions were noted either. No abnormal swelling present in R ankle upon observation. Patient guided through light strengthening with intermittant but minimal complaint of pain. Patient has reported that ionto has helped as well according to mother's report. Normal modalities response noted following removal of the modalities.    Personal Factors and Comorbidities Comorbidity 2    Comorbidities mental disability, lumbar laminectomy 2014, wrist surgery 2020    Examination-Activity Limitations Locomotion Level;Stairs;Squat;Stand    Stability/Clinical Decision Making Stable/Uncomplicated    Rehab Potential Fair    PT Frequency 1x / week    PT Duration 8 weeks    PT Treatment/Interventions ADLs/Self Care Home Management;Iontophoresis 4mg /ml Dexamethasone;Moist Heat;Ultrasound;Electrical Stimulation;Cryotherapy;Therapeutic exercise;Balance training;Neuromuscular re-education;Manual techniques;Passive range of motion;Therapeutic activities;Functional mobility training;Stair training;Gait training;Vasopneumatic Device;Taping    PT Next Visit Plan nustep or bike, ankle stability and strengthening, balance activities. iontophoresis once certification is signed.    PT Home Exercise Plan see patient education section    Consulted and Agree with Plan of Care Patient;Family member/caregiver    Family Member Consulted Mother, Ashlee Hoffman           Patient will benefit from skilled therapeutic intervention in order to improve the following deficits and impairments:  Abnormal gait,Decreased range of motion,Difficulty walking,Decreased activity tolerance,Decreased balance,Decreased strength,Pain  Visit Diagnosis: Pain in right ankle and joints of right foot  Muscle weakness (generalized)     Problem List Patient Active Problem List   Diagnosis Date  Noted  . Wrist joint effusion 05/01/2018  . Morbid obesity ([place]) 07/04/2015  . HNP (herniated nucleus pulposus), lumbar 05/24/2013  . Mentally disabled 01/21/2013    Standley Brooking, PTA 01/01/2021, 11:50 AM  [facility] [place], [place], [address] Phone: [phone]   Fax:  [phone]  Name: Rozell Kettlewell MRN: [id number] Date of Birth: 1981/07/27

## 2021-01-03 ENCOUNTER — Ambulatory Visit: Payer: Medicare Other | Admitting: Physical Therapy

## 2021-01-03 ENCOUNTER — Encounter: Payer: Self-pay | Admitting: Physical Therapy

## 2021-01-03 ENCOUNTER — Other Ambulatory Visit: Payer: Self-pay

## 2021-01-03 DIAGNOSIS — M25561 Pain in right knee: Secondary | ICD-10-CM | POA: Diagnosis not present

## 2021-01-03 DIAGNOSIS — M25572 Pain in left ankle and joints of left foot: Secondary | ICD-10-CM | POA: Diagnosis not present

## 2021-01-03 DIAGNOSIS — M6281 Muscle weakness (generalized): Secondary | ICD-10-CM | POA: Diagnosis not present

## 2021-01-03 DIAGNOSIS — G8929 Other chronic pain: Secondary | ICD-10-CM | POA: Diagnosis not present

## 2021-01-03 DIAGNOSIS — M25571 Pain in right ankle and joints of right foot: Secondary | ICD-10-CM

## 2021-01-03 NOTE — Therapy (Signed)
[facility] [place], [place], [address] Phone: [phone]   Fax:  [phone]  Physical Therapy Treatment  Patient Details  Name: Ashlee Hoffman MRN: [id number] Date of Birth: 10-26-1980 Referring Provider (PT): Celesta Gentile, DPM   Encounter Date: 01/03/2021   PT End of Session - 01/03/21 0908    Visit Number 6    Number of Visits 12    Date for PT Re-Evaluation 02/13/21    Authorization Type [employer], medicaid secondary; progress note every 10th visit    PT Start Time 0903    PT Stop Time 0945    PT Time Calculation (min) 42 min    Activity Tolerance Patient tolerated treatment well    Behavior During Therapy [facility] for tasks assessed/performed           Past Medical History:  Diagnosis Date  . 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 Mental disability   . Shortness of breath    with exertion    Past Surgical History:  Procedure Laterality Date  . LUMBAR LAMINECTOMY/DECOMPRESSION MICRODISCECTOMY N/A 05/24/2013   Procedure: LUMBAR LAMINECTOMY/DECOMPRESSION MICRODISCECTOMY 1 LEVEL LEFT LUMBAR FIVE-SACARAL-ONE;  Surgeon: Charlie Pitter, MD;  Location: [place] [facility];  Service: Neurosurgery;  Laterality: N/A;  left    There were no vitals filed for this visit.   Subjective Assessment - 01/03/21 0904    Subjective COVID-19 screening performed upon arrival. Patient reports her foot still hurts from her fall over the weekend. Patient requested to take her shoe and sock off to show bruising of R foot.    Pertinent History mental disability, lumbar laminectomy 2014, wrist surgery 2020    Limitations Walking;Standing;House hold activities    Diagnostic tests MRI: see imaging tab    Patient Stated Goals stop pain    Currently in Pain? Other (Comment)   Reports pain but no full pain assessment provided             [facility] PT Assessment - 01/03/21 0001      Assessment   Medical Diagnosis Tendonitis of ankle, right     Referring Provider (PT) Celesta Gentile, DPM    Prior Therapy yes      Precautions   Precautions None      Restrictions   Weight Bearing Restrictions No                         [facility] Adult PT Treatment/Exercise - 01/03/21 0001      Modalities   Modalities Vasopneumatic;Iontophoresis      Iontophoresis   Type of Iontophoresis Dexamethasone    Location R anterior ankle    Dose 1.0 ml    Time 8      Vasopneumatic   Number Minutes Vasopneumatic  10 minutes    Vasopnuematic Location  Ankle    Vasopneumatic Pressure Low    Vasopneumatic Temperature  34/pain and edema      Ankle Exercises: Sidelying   Ankle Inversion Strengthening;Right;20 reps;Weights    Ankle Inversion Weights (lbs) 1    Ankle Eversion Strengthening;Right;20 reps;Weights    Ankle Eversion Weights (lbs) 1      Ankle Exercises: Aerobic   Recumbent Bike L4 x12 min, seat 4      Ankle Exercises: Standing   SLS 2x max holds without shoe    Heel Raises Both;20 reps    Toe Raise 20 reps    Other Standing Ankle Exercises R forward lunge x15 reps  Ankle Exercises: Seated   Ankle Circles/Pumps Strengthening;Right;20 reps      Ankle Exercises: Supine   Other Supine Ankle Exercises R ankle isolator 1# x20 reps DF                       PT Long Term Goals - 01/01/21 1150      PT LONG TERM GOAL #1   Title Patient will be modified independent with HEP with assistance from mother    Time 8    Period Weeks    Status On-going      PT LONG TERM GOAL #2   Title Patient will demonstrate 4/5 or greater right ankle MMT to improve stability during functional tasks.    Time 8    Period Weeks    Status On-going      PT LONG TERM GOAL #3   Title Patient will report ability to walk community distances with right ankle pain less than or equal to 2-3/10 on Faces Pain Scale.    Time 8    Period Weeks    Status On-going      PT LONG TERM GOAL #4   Title Patient will report ability to  perform ADLs and home activities with right ankle pain less than or equal to 2-3/10 on Faces Pain Scale.    Time 8    Period Weeks    Status On-going                 Plan - 01/03/21 0935    Clinical Impression Statement Patient presented in clinic with reports discomfort and "red spots" on her R foot. No ecchymosis noted around R ankle structure but ecchymosis noted along line of first metatarsal with increased edema as well. Patient progressed in clinic with strengthening and balance of SLS. Patient able to balance and correct well with RLE SLS without shoe donned and no UE support. Isolatored antigravity ankle strengthening completed as well with no complaints of increased pain. Patient did report some discomfort in R forefoot during ambulation. Normal modalities response noted following removal of the modality.    Personal Factors and Comorbidities Comorbidity 2    Comorbidities mental disability, lumbar laminectomy 2014, wrist surgery 2020    Examination-Activity Limitations Locomotion Level;Stairs;Squat;Stand    Stability/Clinical Decision Making Stable/Uncomplicated    Rehab Potential Fair    PT Frequency 1x / week    PT Duration 8 weeks    PT Treatment/Interventions ADLs/Self Care Home Management;Iontophoresis 4mg /ml Dexamethasone;Moist Heat;Ultrasound;Electrical Stimulation;Cryotherapy;Therapeutic exercise;Balance training;Neuromuscular re-education;Manual techniques;Passive range of motion;Therapeutic activities;Functional mobility training;Stair training;Gait training;Vasopneumatic Device;Taping    PT Next Visit Plan nustep or bike, ankle stability and strengthening, balance activities. iontophoresis once certification is signed.    PT Home Exercise Plan see patient education section    Consulted and Agree with Plan of Care Patient           Patient will benefit from skilled therapeutic intervention in order to improve the following deficits and impairments:  Abnormal  gait,Decreased range of motion,Difficulty walking,Decreased activity tolerance,Decreased balance,Decreased strength,Pain  Visit Diagnosis: Pain in right ankle and joints of right foot  Muscle weakness (generalized)     Problem List Patient Active Problem List   Diagnosis Date Noted  . Wrist joint effusion 05/01/2018  . Morbid obesity ([place]) 07/04/2015  . HNP (herniated nucleus pulposus), lumbar 05/24/2013  . Mentally disabled 01/21/2013    Standley Brooking, PTA 01/03/2021, 9:56 AM  [facility] [address]  [place], [place], [address] Phone: [phone]   Fax:  [phone]  Name: Iara Monds MRN: [id number] Date of Birth: [birth year]

## 2021-01-08 ENCOUNTER — Ambulatory Visit: Payer: Medicare Other | Admitting: Physical Therapy

## 2021-01-08 ENCOUNTER — Encounter: Payer: Self-pay | Admitting: Physical Therapy

## 2021-01-08 ENCOUNTER — Other Ambulatory Visit: Payer: Self-pay

## 2021-01-08 DIAGNOSIS — M25572 Pain in left ankle and joints of left foot: Secondary | ICD-10-CM | POA: Diagnosis not present

## 2021-01-08 DIAGNOSIS — M6281 Muscle weakness (generalized): Secondary | ICD-10-CM | POA: Diagnosis not present

## 2021-01-08 DIAGNOSIS — M25571 Pain in right ankle and joints of right foot: Secondary | ICD-10-CM | POA: Diagnosis not present

## 2021-01-08 DIAGNOSIS — M25561 Pain in right knee: Secondary | ICD-10-CM | POA: Diagnosis not present

## 2021-01-08 DIAGNOSIS — G8929 Other chronic pain: Secondary | ICD-10-CM | POA: Diagnosis not present

## 2021-01-08 NOTE — Therapy (Signed)
[facility] [place], [place], [address] Phone: [phone]   Fax:  [phone]  Physical Therapy Treatment  Patient Details  Name: Ashlee Hoffman MRN: [id number] Date of Birth: 1980/12/07 Referring Provider (PT): Celesta Gentile, DPM   Encounter Date: 01/08/2021   PT End of Session - 01/08/21 0959    Visit Number 7    Number of Visits 12    Date for PT Re-Evaluation 02/13/21    Authorization Type [employer], medicaid secondary; progress note every 10th visit    PT Start Time 0902    PT Stop Time 0944    PT Time Calculation (min) 42 min    Activity Tolerance Patient tolerated treatment well    Behavior During Therapy [facility] for tasks assessed/performed           Past Medical History:  Diagnosis Date  . 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 Mental disability   . Shortness of breath    with exertion    Past Surgical History:  Procedure Laterality Date  . LUMBAR LAMINECTOMY/DECOMPRESSION MICRODISCECTOMY N/A 05/24/2013   Procedure: LUMBAR LAMINECTOMY/DECOMPRESSION MICRODISCECTOMY 1 LEVEL LEFT LUMBAR FIVE-SACARAL-ONE;  Surgeon: Charlie Pitter, MD;  Location: [place] [facility];  Service: Neurosurgery;  Laterality: N/A;  left    There were no vitals filed for this visit.   Subjective Assessment - 01/08/21 0911    Subjective COVID-19 screening performed upon arrival. Reports that her foot still hurts some but bruising is going away.    Pertinent History mental disability, lumbar laminectomy 2014, wrist surgery 2020    Limitations Walking;Standing;House hold activities    Diagnostic tests MRI: see imaging tab    Patient Stated Goals stop pain    Currently in Pain? Other (Comment)   No pain assessment provided             [facility] PT Assessment - 01/08/21 0001      Assessment   Medical Diagnosis Tendonitis of ankle, right    Referring Provider (PT) Celesta Gentile, DPM    Prior Therapy yes      Precautions   Precautions None       Restrictions   Weight Bearing Restrictions No                         [facility] Adult PT Treatment/Exercise - 01/08/21 0001      Modalities   Modalities Iontophoresis      Iontophoresis   Type of Iontophoresis Dexamethasone    Location R anterior ankle    Dose 1.0 ml    Time 8      Manual Therapy   Manual Therapy Myofascial release    Myofascial Release IASTW to R anterior tib to reduce pain      Ankle Exercises: Aerobic   Recumbent Bike L4 x11 min, seat 4      Ankle Exercises: Seated   Ankle Circles/Pumps Strengthening;Right;20 reps   2# ankle isolator   Other Seated Ankle Exercises R ankle isolator 2# Df/Inv/Ev x20 reps each      Ankle Exercises: Standing   SLS 5x max holds on airex without shoe    Rocker Board 3 minutes    Heel Raises Both;20 reps   hip IR and ER; eccentric off 2" step x20 reps   Toe Raise 20 reps   eccentric off 2" step   Balance Beam sidestep, tandem x5 RT  PT Long Term Goals - 01/01/21 1150      PT LONG TERM GOAL #1   Title Patient will be modified independent with HEP with assistance from mother    Time 8    Period Weeks    Status On-going      PT LONG TERM GOAL #2   Title Patient will demonstrate 4/5 or greater right ankle MMT to improve stability during functional tasks.    Time 8    Period Weeks    Status On-going      PT LONG TERM GOAL #3   Title Patient will report ability to walk community distances with right ankle pain less than or equal to 2-3/10 on Faces Pain Scale.    Time 8    Period Weeks    Status On-going      PT LONG TERM GOAL #4   Title Patient will report ability to perform ADLs and home activities with right ankle pain less than or equal to 2-3/10 on Faces Pain Scale.    Time 8    Period Weeks    Status On-going                 Plan - 01/08/21 1332    Clinical Impression Statement Patient presented in clinic with reports of discomfort in R foot.  Ecchymosis has lightened and receding along R forefoot. Patient progressed to more eccentric strengthening and proprioceptive exercises. IASTW completed to anterior R ankle to alleviate tightness of anterior tibialis. Patient and her mother educated that soreness may present during IASTW session. Iontophoresis patched donned to anterior ankle as well.    Personal Factors and Comorbidities Comorbidity 2    Comorbidities mental disability, lumbar laminectomy 2014, wrist surgery 2020    Examination-Activity Limitations Locomotion Level;Stairs;Squat;Stand    Stability/Clinical Decision Making Stable/Uncomplicated    Rehab Potential Fair    PT Frequency 1x / week    PT Duration 8 weeks    PT Treatment/Interventions ADLs/Self Care Home Management;Iontophoresis 4mg /ml Dexamethasone;Moist Heat;Ultrasound;Electrical Stimulation;Cryotherapy;Therapeutic exercise;Balance training;Neuromuscular re-education;Manual techniques;Passive range of motion;Therapeutic activities;Functional mobility training;Stair training;Gait training;Vasopneumatic Device;Taping    PT Next Visit Plan nustep or bike, ankle stability and strengthening, balance activities. iontophoresis once certification is signed.    PT Home Exercise Plan see patient education section    Consulted and Agree with Plan of Care Patient;Family member/caregiver    Family Member Consulted Mother, [name]           Patient will benefit from skilled therapeutic intervention in order to improve the following deficits and impairments:  Abnormal gait,Decreased range of motion,Difficulty walking,Decreased activity tolerance,Decreased balance,Decreased strength,Pain  Visit Diagnosis: Pain in right ankle and joints of right foot  Muscle weakness (generalized)     Problem List Patient Active Problem List   Diagnosis Date Noted  . Wrist joint effusion 05/01/2018  . Morbid obesity ([place]) 07/04/2015  . HNP (herniated nucleus pulposus), lumbar 05/24/2013  .  Mentally disabled 01/21/2013    Standley Brooking, PTA 01/08/2021, 1:37 PM  [facility] [place], [place], [address] Phone: [phone]   Fax:  [phone]  Name: Dimitria Ketchum MRN: [id number] Date of Birth: January 05, 1981

## 2021-01-10 ENCOUNTER — Encounter: Payer: Self-pay | Admitting: Physical Therapy

## 2021-01-10 ENCOUNTER — Other Ambulatory Visit: Payer: Self-pay

## 2021-01-10 ENCOUNTER — Ambulatory Visit: Payer: Medicare Other | Admitting: Physical Therapy

## 2021-01-10 DIAGNOSIS — G8929 Other chronic pain: Secondary | ICD-10-CM | POA: Diagnosis not present

## 2021-01-10 DIAGNOSIS — M25571 Pain in right ankle and joints of right foot: Secondary | ICD-10-CM | POA: Diagnosis not present

## 2021-01-10 DIAGNOSIS — M25572 Pain in left ankle and joints of left foot: Secondary | ICD-10-CM | POA: Diagnosis not present

## 2021-01-10 DIAGNOSIS — M25561 Pain in right knee: Secondary | ICD-10-CM | POA: Diagnosis not present

## 2021-01-10 DIAGNOSIS — M6281 Muscle weakness (generalized): Secondary | ICD-10-CM

## 2021-01-10 NOTE — Therapy (Signed)
[facility] [place], [place], [address] Phone: [phone]   Fax:  [phone]  Physical Therapy Treatment  Patient Details  Name: Ashlee Hoffman MRN: [id number] Date of Birth: 13-May-1981 Referring Provider (PT): Celesta Gentile, DPM   Encounter Date: 01/10/2021   PT End of Session - 01/10/21 0915    Visit Number 8    Number of Visits 12    Date for PT Re-Evaluation 02/13/21    Authorization Type [employer], medicaid secondary; progress note every 10th visit    PT Start Time 0902    PT Stop Time [phone]    PT Time Calculation (min) 41 min    Activity Tolerance Patient tolerated treatment well    Behavior During Therapy [facility] for tasks assessed/performed           Past Medical History:  Diagnosis Date  . 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 Mental disability   . Shortness of breath    with exertion    Past Surgical History:  Procedure Laterality Date  . LUMBAR LAMINECTOMY/DECOMPRESSION MICRODISCECTOMY N/A 05/24/2013   Procedure: LUMBAR LAMINECTOMY/DECOMPRESSION MICRODISCECTOMY 1 LEVEL LEFT LUMBAR FIVE-SACARAL-ONE;  Surgeon: Charlie Pitter, MD;  Location: [place] [facility];  Service: Neurosurgery;  Laterality: N/A;  left    There were no vitals filed for this visit.   Subjective Assessment - 01/10/21 0914    Subjective COVID-19 screening performed upon arrival. Reports "a little bit" of R pain. Mother states that she isn't complaining about ankle pain as much anymore.    Patient is accompained by: Family member   Mother, [name]   Pertinent History mental disability, lumbar laminectomy 2014, wrist surgery 2020    Limitations Walking;Standing;House hold activities    Diagnostic tests MRI: see imaging tab    Patient Stated Goals stop pain    Currently in Pain? Yes    Pain Score 6    FACES scale   Pain Location Ankle    Pain Orientation Right    Pain Descriptors / Indicators Discomfort    Pain Type Chronic pain    Pain Onset  More than a month ago    Pain Frequency Constant              [facility] PT Assessment - 01/10/21 0001      Assessment   Medical Diagnosis Tendonitis of ankle, right    Referring Provider (PT) Celesta Gentile, DPM                         [facility] Adult PT Treatment/Exercise - 01/10/21 0001      Manual Therapy   Manual Therapy Myofascial release    Myofascial Release IASTW to R anterior tib to reduce pain      Ankle Exercises: Aerobic   Recumbent Bike L4 x11 min, seat 4      Ankle Exercises: Standing   Rocker Board 3 minutes    Heel Raises Both;20 reps   2D   Toe Raise 20 reps   eccentric off 2" step   Balance Beam DLS stance on beam, tandem stance on beam; limited UE support    Other Standing Ankle Exercises Normal, inverted BOSU x2 min each with limited UE support    Other Standing Ankle Exercises Forward, lateral lunge x15 reps                       PT Long Term Goals - 01/01/21 1150  PT LONG TERM GOAL #1   Title Patient will be modified independent with HEP with assistance from mother    Time 8    Period Weeks    Status On-going      PT LONG TERM GOAL #2   Title Patient will demonstrate 4/5 or greater right ankle MMT to improve stability during functional tasks.    Time 8    Period Weeks    Status On-going      PT LONG TERM GOAL #3   Title Patient will report ability to walk community distances with right ankle pain less than or equal to 2-3/10 on Faces Pain Scale.    Time 8    Period Weeks    Status On-going      PT LONG TERM GOAL #4   Title Patient will report ability to perform ADLs and home activities with right ankle pain less than or equal to 2-3/10 on Faces Pain Scale.    Time 8    Period Weeks    Status On-going                 Plan - 01/10/21 1124    Clinical Impression Statement Patient presented in clinic with reports of "a little bit" of ankle pain although she indicated 6/10 with FACES scale. Patient's  mother reports that she is not complaining of R ankle as much as she used to at this itme. Patient progressed through more balance and proprioceptive training today within clinic on uneven surfaces. IASTW completed to anterior R ankle as well with moderate redness response. Patient and patient's mother educated that soreness may present due to IASTW session.    Personal Factors and Comorbidities Comorbidity 2    Comorbidities mental disability, lumbar laminectomy 2014, wrist surgery 2020    Examination-Activity Limitations Locomotion Level;Stairs;Squat;Stand    Stability/Clinical Decision Making Stable/Uncomplicated    Rehab Potential Fair    PT Frequency 1x / week    PT Duration 8 weeks    PT Treatment/Interventions ADLs/Self Care Home Management;Iontophoresis 4mg /ml Dexamethasone;Moist Heat;Ultrasound;Electrical Stimulation;Cryotherapy;Therapeutic exercise;Balance training;Neuromuscular re-education;Manual techniques;Passive range of motion;Therapeutic activities;Functional mobility training;Stair training;Gait training;Vasopneumatic Device;Taping    PT Next Visit Plan nustep or bike, ankle stability and strengthening, balance activities. iontophoresis once certification is signed.    PT Home Exercise Plan see patient education section    Consulted and Agree with Plan of Care Patient;Family member/caregiver    Family Member Consulted Mother, [name]           Patient will benefit from skilled therapeutic intervention in order to improve the following deficits and impairments:  Abnormal gait,Decreased range of motion,Difficulty walking,Decreased activity tolerance,Decreased balance,Decreased strength,Pain  Visit Diagnosis: Pain in right ankle and joints of right foot  Muscle weakness (generalized)     Problem List Patient Active Problem List   Diagnosis Date Noted  . Wrist joint effusion 05/01/2018  . Morbid obesity ([place]) 07/04/2015  . HNP (herniated nucleus pulposus), lumbar  05/24/2013  . Mentally disabled 01/21/2013    Standley Brooking, PTA 01/10/2021, 11:28 AM  [facility] [place], [place], [address] Phone: [phone]   Fax:  [phone]  Name: Ashlee Hoffman MRN: [id number] Date of Birth: 14-Jul-1981

## 2021-01-15 ENCOUNTER — Ambulatory Visit: Payer: Medicare Other | Admitting: Physical Therapy

## 2021-01-17 ENCOUNTER — Ambulatory Visit: Payer: Medicare Other | Attending: Podiatry | Admitting: Physical Therapy

## 2021-01-17 ENCOUNTER — Other Ambulatory Visit: Payer: Self-pay

## 2021-01-17 ENCOUNTER — Encounter: Payer: Self-pay | Admitting: Physical Therapy

## 2021-01-17 DIAGNOSIS — M25571 Pain in right ankle and joints of right foot: Secondary | ICD-10-CM | POA: Diagnosis not present

## 2021-01-17 DIAGNOSIS — M5414 Radiculopathy, thoracic region: Secondary | ICD-10-CM | POA: Diagnosis not present

## 2021-01-17 DIAGNOSIS — M6281 Muscle weakness (generalized): Secondary | ICD-10-CM | POA: Diagnosis not present

## 2021-01-17 NOTE — Therapy (Signed)
[facility] [place], [place], [address] Phone: [phone]   Fax:  [phone]  Physical Therapy Treatment  Patient Details  Name: Ashlee Hoffman MRN: [id number] Date of Birth: 1980-10-29 Referring Provider (PT): Celesta Gentile, DPM   Encounter Date: 01/17/2021   PT End of Session - 01/17/21 0903    Visit Number 9    Number of Visits 12    Date for PT Re-Evaluation 02/13/21    Authorization Type [employer], medicaid secondary; progress note every 10th visit    PT Start Time 0901    PT Stop Time 0947    PT Time Calculation (min) 46 min    Activity Tolerance Patient tolerated treatment well    Behavior During Therapy [facility] for tasks assessed/performed           Past Medical History:  Diagnosis Date  . 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 Mental disability   . Shortness of breath    with exertion    Past Surgical History:  Procedure Laterality Date  . LUMBAR LAMINECTOMY/DECOMPRESSION MICRODISCECTOMY N/A 05/24/2013   Procedure: LUMBAR LAMINECTOMY/DECOMPRESSION MICRODISCECTOMY 1 LEVEL LEFT LUMBAR FIVE-SACARAL-ONE;  Surgeon: Charlie Pitter, MD;  Location: [place] [facility];  Service: Neurosurgery;  Laterality: N/A;  left    There were no vitals filed for this visit.   Subjective Assessment - 01/17/21 0902    Subjective COVID-19 screening performed upon arrival. Reports "a little bit" of R pain.    Pertinent History mental disability, lumbar laminectomy 2014, wrist surgery 2020    Limitations Walking;Standing;House hold activities    Diagnostic tests MRI: see imaging tab    Patient Stated Goals stop pain    Currently in Pain? Yes    Pain Score 4    FACES scales   Pain Location Ankle    Pain Orientation Right    Pain Descriptors / Indicators Discomfort    Pain Type Chronic pain    Pain Onset More than a month ago    Pain Frequency Constant              [facility] PT Assessment - 01/17/21 0001      Assessment   Medical  Diagnosis Tendonitis of ankle, right    Referring Provider (PT) Celesta Gentile, DPM    Prior Therapy yes      Precautions   Precautions None      Restrictions   Weight Bearing Restrictions No                         [facility] Adult PT Treatment/Exercise - 01/17/21 0001      Modalities   Modalities Vasopneumatic      Vasopneumatic   Number Minutes Vasopneumatic  10 minutes    Vasopnuematic Location  Ankle    Vasopneumatic Pressure Low    Vasopneumatic Temperature  34/pain      Manual Therapy   Manual Therapy Myofascial release    Myofascial Release IASTW to R anterior tib/medial ankle to reduce pain      Ankle Exercises: Aerobic   Elliptical L2, R1 x5 min    Recumbent Bike L4 x7 min      Ankle Exercises: Standing   SLS x5 max holds on airex; limited UE support    Rocker Board 2 minutes    Heel Raises Both;20 reps   eccentric off 2" step; x20 reps with ball squeeze   Toe Raise 20 reps    Other Standing Ankle  Exercises Forward, lateral lunge x15 reps off 6" step                       PT Long Term Goals - 01/01/21 1150      PT LONG TERM GOAL #1   Title Patient will be modified independent with HEP with assistance from mother    Time 8    Period Weeks    Status On-going      PT LONG TERM GOAL #2   Title Patient will demonstrate 4/5 or greater right ankle MMT to improve stability during functional tasks.    Time 8    Period Weeks    Status On-going      PT LONG TERM GOAL #3   Title Patient will report ability to walk community distances with right ankle pain less than or equal to 2-3/10 on Faces Pain Scale.    Time 8    Period Weeks    Status On-going      PT LONG TERM GOAL #4   Title Patient will report ability to perform ADLs and home activities with right ankle pain less than or equal to 2-3/10 on Faces Pain Scale.    Time 8    Period Weeks    Status On-going                 Plan - 01/17/21 1007    Clinical Impression  Statement Patient presented in clinic with reports of less ankle pain according to FACES scale. Patient progressed through more eccentric strengthening as well use of the elliptical with small incline. Patient did not report any increased pain during today's strengthening session. IASTW completed to anterior tib distally and medial ankle. Normal vasopneumatic response noted after removal as patient requested vasopneumatic device.    Personal Factors and Comorbidities Comorbidity 2    Comorbidities mental disability, lumbar laminectomy 2014, wrist surgery 2020    Examination-Activity Limitations Locomotion Level;Stairs;Squat;Stand    Stability/Clinical Decision Making Stable/Uncomplicated    Rehab Potential Fair    PT Frequency 1x / week    PT Duration 8 weeks    PT Treatment/Interventions ADLs/Self Care Home Management;Iontophoresis 4mg /ml Dexamethasone;Moist Heat;Ultrasound;Electrical Stimulation;Cryotherapy;Therapeutic exercise;Balance training;Neuromuscular re-education;Manual techniques;Passive range of motion;Therapeutic activities;Functional mobility training;Stair training;Gait training;Vasopneumatic Device;Taping    PT Next Visit Plan nustep or bike, ankle stability and strengthening, balance activities. iontophoresis once certification is signed.    PT Home Exercise Plan see patient education section    Consulted and Agree with Plan of Care Patient;Family member/caregiver    Family Member Consulted Mother, [name]           Patient will benefit from skilled therapeutic intervention in order to improve the following deficits and impairments:  Abnormal gait,Decreased range of motion,Difficulty walking,Decreased activity tolerance,Decreased balance,Decreased strength,Pain  Visit Diagnosis: Pain in right ankle and joints of right foot  Muscle weakness (generalized)     Problem List Patient Active Problem List   Diagnosis Date Noted  . Wrist joint effusion 05/01/2018  . Morbid  obesity ([place]) 07/04/2015  . HNP (herniated nucleus pulposus), lumbar 05/24/2013  . Mentally disabled 01/21/2013    Standley Brooking, PTA 01/17/2021, 10:16 AM  [facility] [place], [place], [address] Phone: [phone]   Fax:  [phone]  Name: Ashlee Hoffman MRN: [id number] Date of Birth: May 17, 1981

## 2021-01-22 ENCOUNTER — Ambulatory Visit: Payer: Medicare Other | Admitting: Physical Therapy

## 2021-01-22 ENCOUNTER — Other Ambulatory Visit: Payer: Self-pay

## 2021-01-22 ENCOUNTER — Encounter: Payer: Self-pay | Admitting: Physical Therapy

## 2021-01-22 DIAGNOSIS — M6281 Muscle weakness (generalized): Secondary | ICD-10-CM

## 2021-01-22 DIAGNOSIS — M25571 Pain in right ankle and joints of right foot: Secondary | ICD-10-CM | POA: Diagnosis not present

## 2021-01-22 NOTE — Therapy (Signed)
[facility] [place], [place], [address] Phone: [phone]   Fax:  [phone]  Physical Therapy Treatment  Patient Details  Name: Ashlee Hoffman MRN: [id number] Date of Birth: 1981/09/10 Referring Provider (PT): Celesta Gentile, DPM   Encounter Date: 01/22/2021   PT End of Session - 01/22/21 0904    Visit Number 10    Number of Visits 12    Date for PT Re-Evaluation 02/13/21    Authorization Type [employer] healthcare, medicaid secondary; progress note every 10th visit    PT Start Time 0902    PT Stop Time 0945    PT Time Calculation (min) 43 min    Activity Tolerance Patient tolerated treatment well    Behavior During Therapy [facility] for tasks assessed/performed           Past Medical History:  Diagnosis Date  . 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 Mental disability   . Shortness of breath    with exertion    Past Surgical History:  Procedure Laterality Date  . LUMBAR LAMINECTOMY/DECOMPRESSION MICRODISCECTOMY N/A 05/24/2013   Procedure: LUMBAR LAMINECTOMY/DECOMPRESSION MICRODISCECTOMY 1 LEVEL LEFT LUMBAR FIVE-SACARAL-ONE;  Surgeon: Charlie Pitter, MD;  Location: [place] [facility];  Service: Neurosurgery;  Laterality: N/A;  left    There were no vitals filed for this visit.   Subjective Assessment - 01/22/21 0903    Subjective COVID-19 screening performed upon arrival. Reports "a little bit" of R pain.    Pertinent History mental disability, lumbar laminectomy 2014, wrist surgery 2020    Limitations Walking;Standing;House hold activities    Diagnostic tests MRI: see imaging tab    Patient Stated Goals stop pain    Currently in Pain? Yes    Pain Score 2     Pain Location Ankle    Pain Orientation Right    Pain Descriptors / Indicators Discomfort    Pain Type Chronic pain    Pain Onset More than a month ago    Pain Frequency Constant              [facility] PT Assessment - 01/22/21 0001      Assessment   Medical Diagnosis  Tendonitis of ankle, right    Referring Provider (PT) Celesta Gentile, DPM    Next MD Visit 02/13/2021    Prior Therapy yes      Precautions   Precautions None                         [facility] Adult PT Treatment/Exercise - 01/22/21 0001      Ankle Exercises: Aerobic   Elliptical L2, R1 x5 min    Recumbent Bike L4 x10 min      Ankle Exercises: Standing   Rocker Board 3 minutes    Heel Raises Both;20 reps   off 2" step   Toe Raise 20 reps;Other (comment)   off 2" step   Balance Beam DLS sidestep, tandem x4 RT; no UE support    Other Standing Ankle Exercises Forward, lateral lunge x15 reps off 6" step      Ankle Exercises: Seated   Other Seated Ankle Exercises RLE SL squats x10 reps                       PT Long Term Goals - 01/01/21 1150      PT LONG TERM GOAL #1   Title Patient will be modified independent with HEP with assistance  from mother    Time 8    Period Weeks    Status On-going      PT LONG TERM GOAL #2   Title Patient will demonstrate 4/5 or greater right ankle MMT to improve stability during functional tasks.    Time 8    Period Weeks    Status On-going      PT LONG TERM GOAL #3   Title Patient will report ability to walk community distances with right ankle pain less than or equal to 2-3/10 on Faces Pain Scale.    Time 8    Period Weeks    Status On-going      PT LONG TERM GOAL #4   Title Patient will report ability to perform ADLs and home activities with right ankle pain less than or equal to 2-3/10 on Faces Pain Scale.    Time 8    Period Weeks    Status On-going                 Plan - 01/22/21 0956    Clinical Impression Statement Patient presented in clinic with reports of minimal R ankle pain. Patient progressed through strengthening and proprioceptive training. Minimal incline used on elliptical. No other complaints per patient during treatment. Full assessment of pain or symptoms and improvement due mental  disability and limited understanding.    Personal Factors and Comorbidities Comorbidity 2    Comorbidities mental disability, lumbar laminectomy 2014, wrist surgery 2020    Examination-Activity Limitations Locomotion Level;Stairs;Squat;Stand    Stability/Clinical Decision Making Stable/Uncomplicated    Rehab Potential Fair    PT Frequency 1x / week    PT Duration 8 weeks    PT Treatment/Interventions ADLs/Self Care Home Management;Iontophoresis 4mg /ml Dexamethasone;Moist Heat;Ultrasound;Electrical Stimulation;Cryotherapy;Therapeutic exercise;Balance training;Neuromuscular re-education;Manual techniques;Passive range of motion;Therapeutic activities;Functional mobility training;Stair training;Gait training;Vasopneumatic Device;Taping    PT Next Visit Plan nustep or bike, ankle stability and strengthening, balance activities.    PT Home Exercise Plan see patient education section    Consulted and Agree with Plan of Care Patient;Family member/caregiver    Family Member Consulted Mother, [name]           Patient will benefit from skilled therapeutic intervention in order to improve the following deficits and impairments:  Abnormal gait,Decreased range of motion,Difficulty walking,Decreased activity tolerance,Decreased balance,Decreased strength,Pain  Visit Diagnosis: Pain in right ankle and joints of right foot  Muscle weakness (generalized)     Problem List Patient Active Problem List   Diagnosis Date Noted  . Wrist joint effusion 05/01/2018  . Morbid obesity ([place]) 07/04/2015  . HNP (herniated nucleus pulposus), lumbar 05/24/2013  . Mentally disabled 01/21/2013    Standley Brooking, PTA 01/22/2021, 10:00 AM  [facility] [place], [place], [address] Phone: [phone]   Fax:  [phone]  Name: Ashlee Hoffman MRN: [id number] Date of Birth: [birth year]  Progress Note Reporting Period 12/19/20 to 01/22/21.  See note below for  Objective Data and Assessment of Progress/Goals. Patient goals ongoing at this time.    Mali Applegate MPT

## 2021-01-24 ENCOUNTER — Encounter: Payer: Self-pay | Admitting: Physical Therapy

## 2021-01-24 ENCOUNTER — Ambulatory Visit: Payer: Medicare Other | Admitting: Physical Therapy

## 2021-01-24 ENCOUNTER — Other Ambulatory Visit: Payer: Self-pay

## 2021-01-24 DIAGNOSIS — M6281 Muscle weakness (generalized): Secondary | ICD-10-CM

## 2021-01-24 DIAGNOSIS — M25571 Pain in right ankle and joints of right foot: Secondary | ICD-10-CM | POA: Diagnosis not present

## 2021-01-24 NOTE — Therapy (Signed)
[facility] [place], [place], [address] Phone: [phone]   Fax:  [phone]  Physical Therapy Treatment  Patient Details  Name: Ashlee Hoffman MRN: [id number] Date of Birth: 01-29-1981 Referring Provider (PT): Celesta Gentile, DPM   Encounter Date: 01/24/2021   PT End of Session - 01/24/21 0906    Visit Number 11    Number of Visits 12    Date for PT Re-Evaluation 02/13/21    Authorization Type [employer], medicaid secondary; progress note every 10th visit    PT Start Time 0902    PT Stop Time 0957    PT Time Calculation (min) 55 min    Activity Tolerance Patient tolerated treatment well    Behavior During Therapy [facility] for tasks assessed/performed           Past Medical History:  Diagnosis Date  . 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 Mental disability   . Shortness of breath    with exertion    Past Surgical History:  Procedure Laterality Date  . LUMBAR LAMINECTOMY/DECOMPRESSION MICRODISCECTOMY N/A 05/24/2013   Procedure: LUMBAR LAMINECTOMY/DECOMPRESSION MICRODISCECTOMY 1 LEVEL LEFT LUMBAR FIVE-SACARAL-ONE;  Surgeon: Charlie Pitter, MD;  Location: [facility];  Service: Neurosurgery;  Laterality: N/A;  left    There were no vitals filed for this visit.   Subjective Assessment - 01/24/21 0905    Subjective COVID-19 screening performed upon arrival. Reports "a little bit" of R pain.    Pertinent History mental disability, lumbar laminectomy 2014, wrist surgery 2020    Limitations Walking;Standing;House hold activities    Diagnostic tests MRI: see imaging tab    Patient Stated Goals stop pain    Currently in Pain? Yes    Pain Score 4     Pain Location Ankle    Pain Orientation Right    Pain Descriptors / Indicators Discomfort    Pain Type Chronic pain    Pain Onset More than a month ago    Pain Frequency Constant              [facility] PT Assessment - 01/24/21 0001      Assessment   Medical Diagnosis  Tendonitis of ankle, right    Referring Provider (PT) Celesta Gentile, DPM    Next MD Visit 02/13/2021    Prior Therapy yes      Precautions   Precautions None                         [facility] Adult PT Treatment/Exercise - 01/24/21 0001      Modalities   Modalities Vasopneumatic      Vasopneumatic   Number Minutes Vasopneumatic  10 minutes    Vasopnuematic Location  Ankle    Vasopneumatic Pressure Low    Vasopneumatic Temperature  34/pain      Manual Therapy   Manual Therapy Myofascial release    Myofascial Release IASTW to R anterior tib/medial ankle to reduce pain      Ankle Exercises: Aerobic   Elliptical L2, R1 x5 min    Recumbent Bike L4 x10 min      Ankle Exercises: Standing   Rocker Board 3 minutes    Heel Raises Both;20 reps   eccentric off 2" step   Toe Raise 20 reps;Other (comment)   eccentric off 2" step                      PT Long Term  Goals - 01/24/21 0955      PT LONG TERM GOAL #1   Title Patient will be modified independent with HEP with assistance from mother    Time 8    Period Weeks    Status On-going      PT LONG TERM GOAL #2   Title Patient will demonstrate 4/5 or greater right ankle MMT to improve stability during functional tasks.    Time 8    Period Weeks    Status On-going      PT LONG TERM GOAL #3   Title Patient will report ability to walk community distances with right ankle pain less than or equal to 2-3/10 on Faces Pain Scale.    Time 8    Period Weeks    Status Not Met   4/10 per FACES scale per patient     PT LONG TERM GOAL #4   Title Patient will report ability to perform ADLs and home activities with right ankle pain less than or equal to 2-3/10 on Faces Pain Scale.    Time 8    Period Weeks    Status Achieved   2/10 on FACES scale per patient                Plan - 01/24/21 0954    Clinical Impression Statement Patient presented in clinic with reports of 4/10 pain in R ankle per FACES  scale. Patient does report intermittant increased pain with community ambulation while shopping. Patient progressed through eccentric strengthening with constant guidance via mulitmodal cueing due to limited understanding and requirement to maintain focus on task. Good redness response noted over R anterior tib and medial ankle during IASTW session. Normal vasopneumatic response noted following removal of the modality.    Personal Factors and Comorbidities Comorbidity 2    Comorbidities mental disability, lumbar laminectomy 2014, wrist surgery 2020    Examination-Activity Limitations Locomotion Level;Stairs;Squat;Stand    Stability/Clinical Decision Making Stable/Uncomplicated    Rehab Potential Fair    PT Frequency 1x / week    PT Duration 8 weeks    PT Treatment/Interventions ADLs/Self Care Home Management;Iontophoresis 7m/ml Dexamethasone;Moist Heat;Ultrasound;Electrical Stimulation;Cryotherapy;Therapeutic exercise;Balance training;Neuromuscular re-education;Manual techniques;Passive range of motion;Therapeutic activities;Functional mobility training;Stair training;Gait training;Vasopneumatic Device;Taping    PT Next Visit Plan nustep or bike, ankle stability and strengthening, balance activities.    PT Home Exercise Plan see patient education section    Consulted and Agree with Plan of Care Patient           Patient will benefit from skilled therapeutic intervention in order to improve the following deficits and impairments:  Abnormal gait,Decreased range of motion,Difficulty walking,Decreased activity tolerance,Decreased balance,Decreased strength,Pain  Visit Diagnosis: Pain in right ankle and joints of right foot  Muscle weakness (generalized)     Problem List Patient Active Problem List   Diagnosis Date Noted  . Wrist joint effusion 05/01/2018  . Morbid [place] ([place] 07/04/2015  . HNP (herniated nucleus pulposus), lumbar 05/24/2013  . Mentally disabled 01/21/2013    KStandley Brooking PTA 01/24/2021, 10:00 AM  [facility] [address]Phone: [phone]  Fax:  [phone] Name: KDezarai PrewMRN: [id number] of Birth: 6September 14, 1982

## 2021-01-29 ENCOUNTER — Ambulatory Visit: Payer: Medicare Other | Admitting: Physical Therapy

## 2021-01-29 ENCOUNTER — Other Ambulatory Visit: Payer: Self-pay

## 2021-01-29 ENCOUNTER — Encounter: Payer: Self-pay | Admitting: Physical Therapy

## 2021-01-29 DIAGNOSIS — M25571 Pain in right ankle and joints of right foot: Secondary | ICD-10-CM

## 2021-01-29 DIAGNOSIS — M6281 Muscle weakness (generalized): Secondary | ICD-10-CM

## 2021-01-29 NOTE — Therapy (Signed)
[facility] [place], [place], [address] Phone: [phone]   Fax:  [phone]  Physical Therapy Treatment  Patient Details  Name: Ashlee Hoffman MRN: [id number] Date of Birth: 09-11-1981 Referring Provider (PT): Celesta Gentile, DPM   Encounter Date: 01/29/2021   PT End of Session - 01/29/21 0903    Visit Number 12    Number of Visits 12    Date for PT Re-Evaluation 02/13/21    Authorization Type [employer], medicaid secondary; progress note every 10th visit    PT Start Time 0901    PT Stop Time 0944    PT Time Calculation (min) 43 min    Activity Tolerance Patient tolerated treatment well    Behavior During Therapy [facility] for tasks assessed/performed           Past Medical History:  Diagnosis Date  . 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 Mental disability   . Shortness of breath    with exertion    Past Surgical History:  Procedure Laterality Date  . LUMBAR LAMINECTOMY/DECOMPRESSION MICRODISCECTOMY N/A 05/24/2013   Procedure: LUMBAR LAMINECTOMY/DECOMPRESSION MICRODISCECTOMY 1 LEVEL LEFT LUMBAR FIVE-SACARAL-ONE;  Surgeon: Charlie Pitter, MD;  Location: [facility];  Service: Neurosurgery;  Laterality: N/A;  left    There were no vitals filed for this visit.   Subjective Assessment - 01/29/21 0902    Subjective COVID-19 screening performed upon arrival. Reports "a little bit" of R pain and feeling wobbly today.    Pertinent History mental disability, lumbar laminectomy 2014, wrist surgery 2020    Limitations Walking;Standing;House hold activities    Diagnostic tests MRI: see imaging tab    Patient Stated Goals stop pain    Currently in Pain? Yes    Pain Score 6    FACES scale   Pain Location Ankle    Pain Orientation Right    Pain Descriptors / Indicators Aching    Pain Type Chronic pain    Pain Onset More than a month ago    Pain Frequency Intermittent              [facility] PT Assessment - 01/29/21 0001       Assessment   Medical Diagnosis Tendonitis of ankle, right    Referring Provider (PT) Celesta Gentile, DPM    Next MD Visit 02/13/2021    Prior Therapy yes      Precautions   Precautions None      Restrictions   Weight Bearing Restrictions No      Strength   Overall Strength Within functional limits for tasks performed    Strength Assessment Site Ankle    Right/Left Ankle Right    Right Ankle Dorsiflexion 4+/5    Right Ankle Inversion 4/5    Right Ankle Eversion 4/5                         [facility] Adult PT Treatment/Exercise - 01/29/21 0001      Vasopneumatic   Number Minutes Vasopneumatic  10 minutes    Vasopnuematic Location  Ankle    Vasopneumatic Pressure Low    Vasopneumatic Temperature  34/pain      Ankle Exercises: Aerobic   Recumbent Bike L4 x10 min      Ankle Exercises: Standing   Rocker Board 3 minutes    Heel Raises Both;20 reps   eccentric off 4" step   Toe Raise 20 reps;Other (comment)   eccentric off 4" step  Heel Walk (Round Trip) x2 RT    Toe Walk (Round Trip) x2 RT    Balance Beam DLS sidestep, tandem x4 RT; no UE support    Braiding (Round Trip) sidestepping with green theraband x3 RT                       PT Long Term Goals - 01/29/21 1610      PT LONG TERM GOAL #1   Title Patient will be modified independent with HEP with assistance from mother    Time 8    Period Weeks    Status Partially Met      PT LONG TERM GOAL #2   Title Patient will demonstrate 4/5 or greater right ankle MMT to improve stability during functional tasks.    Time 8    Period Weeks    Status Achieved      PT LONG TERM GOAL #3   Title Patient will report ability to walk community distances with right ankle pain less than or equal to 2-3/10 on Faces Pain Scale.    Time 8    Period Weeks    Status Not Met   4/10 per FACES scale per patient     PT LONG TERM GOAL #4   Title Patient will report ability to perform ADLs and home activities  with right ankle pain less than or equal to 2-3/10 on Faces Pain Scale.    Time 8    Period Weeks    Status Achieved   2/10 on FACES scale per patient                Plan - 01/29/21 0953    Clinical Impression Statement Patient presented in clinic with more R ankle ache per FACES scale. Patient progressed through concentric and eccentric strengthening as well as proprioceptive stabilization exercises. Patient required constant supervision or multimodal cueing especially demo to continue exercise and technique. Patient somewhat limited with understanding due to mental disability. Improved R ankle MMT assessed today in sitting. Patient and family has been clearing land and walking in wooded areas. Normal vasopneumatic response noted following removal of the modality.    Personal Factors and Comorbidities Comorbidity 2    Comorbidities mental disability, lumbar laminectomy 2014, wrist surgery 2020    Examination-Activity Limitations Locomotion Level;Stairs;Squat;Stand    Stability/Clinical Decision Making Stable/Uncomplicated    Rehab Potential Fair    PT Frequency 1x / week    PT Duration 8 weeks    PT Treatment/Interventions ADLs/Self Care Home Management;Iontophoresis 68m/ml Dexamethasone;Moist Heat;Ultrasound;Electrical Stimulation;Cryotherapy;Therapeutic exercise;Balance training;Neuromuscular re-education;Manual techniques;Passive range of motion;Therapeutic activities;Functional mobility training;Stair training;Gait training;Vasopneumatic Device;Taping    PT Next Visit Plan nustep or bike, ankle stability and strengthening, balance activities.    PT Home Exercise Plan see patient education section    Consulted and Agree with Plan of Care Patient;Family member/caregiver    Family Member Consulted Mother, [name]           Patient will benefit from skilled therapeutic intervention in order to improve the following deficits and impairments:  Abnormal gait,Decreased range of  motion,Difficulty walking,Decreased activity tolerance,Decreased balance,Decreased strength,Pain  Visit Diagnosis: Pain in right ankle and joints of right foot  Muscle weakness (generalized)     Problem List Patient Active Problem List   Diagnosis Date Noted  . Wrist joint effusion 05/01/2018  . Morbid [place] 07/04/2015  . HNP (herniated nucleus pulposus), lumbar 05/24/2013  . Mentally disabled 01/21/2013    KMerleen Nicely  Deloris Ping, PTA 01/29/21 10:04 AM   [facility] [address] [place], [place], [address] Phone: [phone]   Fax:  [phone]  Name: Ashlee Hoffman MRN: [id number] Date of Birth: [birth year]  PHYSICAL THERAPY DISCHARGE SUMMARY  Visits from Start of Care: 12.  Current functional level related to goals / functional outcomes: See above.   Remaining deficits:  All goals essentially met.  Education / Equipment:  HEP.  Plan: Patient agrees to discharge.  Patient goals were partially met. Patient is being discharged due to being pleased with the current functional level.  ?????          Mali Applegate MPT

## 2021-02-13 ENCOUNTER — Ambulatory Visit (INDEPENDENT_AMBULATORY_CARE_PROVIDER_SITE_OTHER): Payer: Medicare Other | Admitting: Podiatry

## 2021-02-13 ENCOUNTER — Other Ambulatory Visit: Payer: Self-pay

## 2021-02-13 DIAGNOSIS — M7751 Other enthesopathy of right foot: Secondary | ICD-10-CM

## 2021-02-13 DIAGNOSIS — B351 Tinea unguium: Secondary | ICD-10-CM

## 2021-02-13 DIAGNOSIS — M25571 Pain in right ankle and joints of right foot: Secondary | ICD-10-CM | POA: Diagnosis not present

## 2021-02-13 DIAGNOSIS — M79674 Pain in right toe(s): Secondary | ICD-10-CM

## 2021-02-13 DIAGNOSIS — G8929 Other chronic pain: Secondary | ICD-10-CM

## 2021-02-13 MED ORDER — DICLOFENAC SODIUM 1 % EX GEL: 2.0000 g | Freq: Four times a day (QID) | CUTANEOUS | 2 refills | Status: DC

## 2021-02-14 NOTE — Progress Notes (Signed)
Subjective: 40 year old female presents the office today for follow-up evaluation of right ankle pain.  She states that she wears the ankle brace when she is outside on uneven surfaces and the brace does help.  She does feel that she is better.  Some some discomfort.  She also describes "knots" on the side of her foot which cause discomfort.  Denies any recent injury or trauma any changes.   She also presents to have the nails trimmed there is becoming thickened elongated she cannot do them herself.  Denies any systemic complaints such as fevers, chills, nausea, vomiting. No acute changes since last appointment, and no other complaints at this time.   Objective: NAD DP/PT pulses palpable bilaterally, CRT less than 3 seconds Nails are hypertrophic, dystrophic, brittle, discolored, elongated 10. No surrounding redness or drainage. Tenderness nails 1-5 bilaterally.  Minimal hyperkeratotic lesions medial hallux without any underlying ulceration drainage or signs of infection. Not able to identify any specific area of tenderness particular along the right ankle.  There is no pain on the flexor, extensor tendons.  Ankle appears to be stable with negative anterior drawer talar tilt.  No edema.  I also believe that the "knots" she is describing is on the fifth metatarsal base.  No significant pain today.  MMT 5/5. No pain with calf compression, swelling, warmth, erythema  Assessment: Chronic right ankle pain, tendinitis; symptomatic onychomycosis, hyperkeratotic lesion  Plan: -All treatment options discussed with the patient including all alternatives, risks, complications.  -Continue with shoes and good arch supports.  She can take meloxicam daily.  Discussed trying to hold off on this and using Voltaren gel.  I discussed with her exercises on her to do a daily basis to help strengthen the ankle as well. -Debrided nails x10 without any complications or bleeding -Debrided hyperkeratotic lesions x2  without any complications or bleeding  Trula Slade DPM

## 2021-02-18 ENCOUNTER — Other Ambulatory Visit: Payer: Self-pay | Admitting: Nurse Practitioner

## 2021-02-18 DIAGNOSIS — K219 Gastro-esophageal reflux disease without esophagitis: Secondary | ICD-10-CM

## 2021-04-17 ENCOUNTER — Other Ambulatory Visit: Payer: Self-pay | Admitting: Nurse Practitioner

## 2021-04-17 DIAGNOSIS — M25561 Pain in right knee: Secondary | ICD-10-CM

## 2021-04-18 DIAGNOSIS — M5124 Other intervertebral disc displacement, thoracic region: Secondary | ICD-10-CM | POA: Diagnosis not present

## 2021-04-18 DIAGNOSIS — R030 Elevated blood-pressure reading, without diagnosis of hypertension: Secondary | ICD-10-CM | POA: Diagnosis not present

## 2021-05-15 ENCOUNTER — Ambulatory Visit: Payer: Medicare Other | Admitting: Podiatry

## 2021-05-17 ENCOUNTER — Other Ambulatory Visit: Payer: Self-pay | Admitting: Nurse Practitioner

## 2021-05-17 DIAGNOSIS — K219 Gastro-esophageal reflux disease without esophagitis: Secondary | ICD-10-CM

## 2021-05-22 ENCOUNTER — Ambulatory Visit (INDEPENDENT_AMBULATORY_CARE_PROVIDER_SITE_OTHER): Payer: Medicare Other | Admitting: Podiatry

## 2021-05-22 ENCOUNTER — Ambulatory Visit (INDEPENDENT_AMBULATORY_CARE_PROVIDER_SITE_OTHER): Payer: Medicare Other

## 2021-05-22 ENCOUNTER — Other Ambulatory Visit: Payer: Self-pay

## 2021-05-22 DIAGNOSIS — B351 Tinea unguium: Secondary | ICD-10-CM | POA: Diagnosis not present

## 2021-05-22 DIAGNOSIS — M79674 Pain in right toe(s): Secondary | ICD-10-CM

## 2021-05-22 DIAGNOSIS — S93401A Sprain of unspecified ligament of right ankle, initial encounter: Secondary | ICD-10-CM

## 2021-05-22 DIAGNOSIS — M7751 Other enthesopathy of right foot: Secondary | ICD-10-CM | POA: Diagnosis not present

## 2021-05-24 MED ORDER — LIDOCAINE 5 % EX PTCH: 1.0000 | MEDICATED_PATCH | CUTANEOUS | 0 refills | Status: DC

## 2021-05-24 NOTE — Progress Notes (Signed)
Subjective: 40 year old female presents the office today for concerns of thick elongated toenails as well as calluses.  Also.  She states that she did have an injury when she was running outside in the rain and she twisted her right ankle and she had some swelling and bruising to the side of her foot.  She previously has been having some ankle pain and went to physical therapy without significant improvement.  Has been wearing the inserts.  She uses Voltaren gel intermittently.  She can take meloxicam.  No fevers or chills or other concerns today.  Objective: NAD DP/PT pulses palpable bilaterally, CRT less than 3 seconds Nails are hypertrophic, dystrophic, brittle, discolored, elongated 10. No surrounding redness or drainage. Tenderness nails 1-5 bilaterally.  Minimal hyperkeratotic lesions medial hallux without any underlying ulceration drainage or signs of infection. There is mild tenderness on the lateral aspect the foot was along the fifth metatarsal head on the right side.  There is localized edema and ecchymosis.  Slight discomfort in the anterior lateral aspect ankle joint.  No area of pinpoint tenderness to the tibia, fibula or other areas of the foot. No pain with calf compression, swelling, warmth, erythema  Assessment: Chronic right ankle pain, ankle sprain; symptomatic onychomycosis, hyperkeratotic lesion  Plan: -All treatment options discussed with the patient including all alternatives, risks, complications.  -X-rays obtained and reviewed.  No definitive evidence of acute fracture or stress fracture. -Given her recent injury recommended to patient return to the brace and continue ice and elevation as well as meloxicam as needed.  As she starts to feel better discussed gentle stretching, rehab exercises and to continue on daily basis as well.  Also lidocaine patches ordered. -Debrided nails x10 without any complications or bleeding -Debrided hyperkeratotic lesions x2 without any  complications or bleeding  Trula Slade DPM

## 2021-05-31 ENCOUNTER — Telehealth: Payer: Self-pay | Admitting: *Deleted

## 2021-05-31 NOTE — Telephone Encounter (Signed)
A prior authorization was sent to the cover my meds and I did it today for the patient. Ashlee Hoffman

## 2021-06-05 ENCOUNTER — Ambulatory Visit: Payer: Medicare Other | Admitting: Podiatry

## 2021-06-06 ENCOUNTER — Telehealth: Payer: Self-pay | Admitting: *Deleted

## 2021-06-06 NOTE — Telephone Encounter (Signed)
Called the patient, no answer,left vmessage to call back so that we may know if she has received her lidocaine patches.

## 2021-06-06 NOTE — Telephone Encounter (Signed)
Called and spoke with the patient's mom [facility]) and mom stated that the insurance sent them a denial two days ago and stated that they were not going to approve the medicine and mom was wandering could we send it to another pharmacy that would not need prior-authorization. Lattie Haw

## 2021-06-13 ENCOUNTER — Other Ambulatory Visit: Payer: Self-pay | Admitting: Nurse Practitioner

## 2021-06-13 DIAGNOSIS — K219 Gastro-esophageal reflux disease without esophagitis: Secondary | ICD-10-CM

## 2021-06-13 NOTE — Telephone Encounter (Signed)
Called and left a message for the patient's mom to call me back in [place] and I left the number. Ashlee Hoffman

## 2021-06-13 NOTE — Telephone Encounter (Signed)
MMM NTBS 30 days given 05/17/21

## 2021-06-15 ENCOUNTER — Other Ambulatory Visit: Payer: Self-pay | Admitting: Podiatry

## 2021-06-15 MED ORDER — LIDOCAINE 5 % EX PTCH: 1.0000 | MEDICATED_PATCH | CUTANEOUS | 0 refills | Status: DC

## 2021-06-19 ENCOUNTER — Ambulatory Visit (INDEPENDENT_AMBULATORY_CARE_PROVIDER_SITE_OTHER): Payer: Medicare Other | Admitting: Nurse Practitioner

## 2021-06-19 ENCOUNTER — Other Ambulatory Visit: Payer: Self-pay

## 2021-06-19 ENCOUNTER — Encounter: Payer: Self-pay | Admitting: Nurse Practitioner

## 2021-06-19 VITALS — BP 133/91 | HR 83 | Temp 97.8°F | Resp 20 | Ht 63.0 in | Wt 239.0 lb

## 2021-06-19 DIAGNOSIS — E7800 Pure hypercholesterolemia, unspecified: Secondary | ICD-10-CM | POA: Diagnosis not present

## 2021-06-19 DIAGNOSIS — F79 Unspecified intellectual disabilities: Secondary | ICD-10-CM

## 2021-06-19 DIAGNOSIS — K219 Gastro-esophageal reflux disease without esophagitis: Secondary | ICD-10-CM

## 2021-06-19 HISTORY — DX: Pure hypercholesterolemia, unspecified: E78.00

## 2021-06-19 MED ORDER — OMEPRAZOLE 40 MG PO CPDR: 40.0000 mg | DELAYED_RELEASE_CAPSULE | Freq: Every day | ORAL | 0 refills | Status: DC

## 2021-06-19 MED ORDER — OMEPRAZOLE 40 MG PO CPDR: 40.0000 mg | DELAYED_RELEASE_CAPSULE | Freq: Every day | ORAL | 1 refills | Status: DC

## 2021-06-19 NOTE — Progress Notes (Signed)
Subjective:    Patient ID: Ashlee Hoffman, female    DOB: 04-05-1981, 40 y.o.   MRN: [id number]   Chief Complaint: medical management of chronic issues     HPI:  1. Mentally disabled Patient lives with her mom and step dad. She is not left alone at home very often. Is able to perform ADL's like personal care on her own. Is very pleasant to talk to.  2. Morbid obesity (Ashlee Hoffman) Weight is down 9lbs Wt Readings from Last 3 Encounters:  06/19/21 239 lb (108.4 kg)  11/20/20 248 lb (112.5 kg)  01/05/20 247 lb 3.2 oz (112.1 kg)   BMI Readings from Last 3 Encounters:  06/19/21 42.34 kg/m  11/20/20 43.93 kg/m  01/05/20 43.79 kg/m     3. Pure hypercholesterolemia Last LDL were elevated- she is not on any medication. Lab Results  Component Value Date   CHOL 165 11/20/2020   HDL 34 (L) 11/20/2020   LDLCALC 103 (H) 11/20/2020   TRIG 157 (H) 11/20/2020   CHOLHDL 4.9 (H) 11/20/2020        Outpatient Encounter Medications as of 06/19/2021  Medication Sig   Acetaminophen (TYLENOL ARTHRITIS EXT RELIEF PO) Take 1-2 tablets by mouth as needed. Reported on 01/12/2016   acyclovir ointment (ZOVIRAX) 5 % Apply 1 application topically every 2 (two) hours as needed.   diclofenac Sodium (VOLTAREN) 1 % GEL Apply 2 g topically 4 (four) times daily. Rub into affected area of foot 2 to 4 times daily   ketoconazole (NIZORAL) 2 % cream Apply 1 application topically daily.   lidocaine (LIDODERM) 5 % Place 1 patch onto the skin daily. Remove & Discard patch within 12 hours or as directed by MD   medroxyPROGESTERone (PROVERA) 10 MG tablet Take 1 tablet (10 mg total) by mouth daily.   meloxicam (MOBIC) 15 MG tablet TAKE 1 TABLET BY MOUTH EVERY DAY   methylPREDNISolone (MEDROL DOSEPAK) 4 MG TBPK tablet Take as directed   omeprazole (PRILOSEC) 40 MG capsule Take 1 capsule (40 mg total) by mouth daily. (NEEDS TO BE SEEN BEFORE NEXT REFILL)   No facility-administered encounter medications on file as of  06/19/2021.    Past Surgical History:  Procedure Laterality Date   LUMBAR LAMINECTOMY/DECOMPRESSION MICRODISCECTOMY N/A 05/24/2013   Procedure: LUMBAR LAMINECTOMY/DECOMPRESSION MICRODISCECTOMY 1 LEVEL LEFT LUMBAR FIVE-SACARAL-ONE;  Surgeon: Charlie Pitter, MD;  Location: [facility];  Service: Neurosurgery;  Laterality: N/A;  left    Family History  Problem Relation Age of Onset   Hypertension Mother    Arthritis Mother    Diabetes Maternal Grandfather    Hypertension Maternal Grandfather    Heart disease Maternal Grandfather     New complaints: None today  Social history: Lives with mom and step dad  Controlled substance contract: na      Review of Systems  Constitutional:  Negative for diaphoresis.  Eyes:  Negative for pain.  Respiratory:  Negative for shortness of breath.   Cardiovascular:  Negative for chest pain, palpitations and leg swelling.  Gastrointestinal:  Negative for abdominal pain.  Endocrine: Negative for polydipsia.  Skin:  Negative for rash.  Neurological:  Negative for dizziness, weakness and headaches.  Hematological:  Does not bruise/bleed easily.  All other systems reviewed and are negative.     Objective:   Physical Exam Vitals and nursing note reviewed.  Constitutional:      General: She is not in acute distress.    Appearance: Normal appearance. She is well-developed.  HENT:     Head: Normocephalic.     Right Ear: Tympanic membrane normal.     Left Ear: Tympanic membrane normal.     Nose: Nose normal.     Mouth/Throat:     Mouth: Mucous membranes are moist.  Eyes:     Pupils: Pupils are equal, round, and reactive to light.  Neck:     Vascular: No carotid bruit or JVD.  Cardiovascular:     Rate and Rhythm: Normal rate and regular rhythm.     Heart sounds: Normal heart sounds.  Pulmonary:     Effort: Pulmonary effort is normal. No respiratory distress.     Breath sounds: Normal breath sounds. No wheezing or rales.  Chest:     Chest  wall: No tenderness.  Abdominal:     General: Bowel sounds are normal. There is no distension or abdominal bruit.     Palpations: Abdomen is soft. There is no hepatomegaly, splenomegaly, mass or pulsatile mass.     Tenderness: There is no abdominal tenderness.  Musculoskeletal:        General: Normal range of motion.     Cervical back: Normal range of motion and neck supple.  Lymphadenopathy:     Cervical: No cervical adenopathy.  Skin:    General: Skin is warm and dry.  Neurological:     Mental Status: She is alert and oriented to person, place, and time.     Deep Tendon Reflexes: Reflexes are normal and symmetric.  Psychiatric:        Behavior: Behavior normal.        Thought Content: Thought content normal.        Judgment: Judgment normal.    BP (!) 133/91   Pulse 83   Temp 97.8 F (36.6 C) (Temporal)   Resp 20   Ht 5\' 3"  (1.6 m)   Wt 239 lb (108.4 kg)   SpO2 100%   BMI 42.34 kg/m        Assessment & Plan:  Ashlee Hoffman comes in today with chief complaint of Medical Management of Chronic Issues   Diagnosis and orders addressed:  1. Mentally disabled  2. Morbid obesity (Ashlee Hoffman) Discussed diet and exercise for person with BMI >25 Will recheck weight in 3-6 months  3. Pure hypercholesterolemia Low fat diet  4. Gastroesophageal reflux disease without esophagitis Avoid spicy foods Do not eat 2 hours prior to bedtime  - omeprazole (PRILOSEC) 40 MG capsule; Take 1 capsule (40 mg total) by mouth daily. (NEEDS TO BE SEEN BEFORE NEXT REFILL)  Dispense: 90 capsule; Refill: 1   Labs pending Health Maintenance reviewed Diet and exercise encouraged  Follow up plan: 6 months   Mary-Margaret Hassell Done, FNP

## 2021-06-19 NOTE — Addendum Note (Signed)
Addended by: Chevis Pretty on: 06/19/2021 03:46 PM   Modules accepted: Orders

## 2021-06-19 NOTE — Patient Instructions (Signed)
Exercising to Stay Healthy °To become healthy and stay healthy, it is recommended that you do moderate-intensity and vigorous-intensity exercise. You can tell that you are exercising at a moderate intensity if your heart starts beating faster and you start breathing faster but can still hold a conversation. You can tell that you are exercising at a vigorous intensity if you are breathing much harder and faster and cannot hold a conversation while exercising. °How can exercise benefit me? °Exercising regularly is important. It has many health benefits, such as: °Improving overall fitness, flexibility, and endurance. °Increasing bone density. °Helping with weight control. °Decreasing body fat. °Increasing muscle strength and endurance. °Reducing stress and tension, anxiety, depression, or anger. °Improving overall health. °What guidelines should I follow while exercising? °Before you start a new exercise program, talk with your health care provider. °Do not exercise so much that you hurt yourself, feel dizzy, or get very short of breath. °Wear comfortable clothes and wear shoes with good support. °Drink plenty of water while you exercise to prevent dehydration or heat stroke. °Work out until your breathing and your heartbeat get faster (moderate intensity). °How often should I exercise? °Choose an activity that you enjoy, and set realistic goals. Your health care provider can help you make an activity plan that is individually designed and works best for you. °Exercise regularly as told by your health care provider. This may include: °Doing strength training two times a week, such as: °Lifting weights. °Using resistance bands. °Push-ups. °Sit-ups. °Yoga. °Doing a certain intensity of exercise for a given amount of time. Choose from these options: °A total of 150 minutes of moderate-intensity exercise every week. °A total of 75 minutes of vigorous-intensity exercise every week. °A mix of moderate-intensity and  vigorous-intensity exercise every week. °Children, pregnant women, people who have not exercised regularly, people who are overweight, and older adults may need to talk with a health care provider about what activities are safe to perform. If you have a medical condition, be sure to talk with your health care provider before you start a new exercise program. °What are some exercise ideas? °Moderate-intensity exercise ideas include: °Walking 1 mile (1.6 km) in about 15 minutes. °Biking. °Hiking. °Golfing. °Dancing. °Water aerobics. °Vigorous-intensity exercise ideas include: °Walking 4.5 miles (7.2 km) or more in about 1 hour. °Jogging or running 5 miles (8 km) in about 1 hour. °Biking 10 miles (16.1 km) or more in about 1 hour. °Lap swimming. °Roller-skating or in-line skating. °Cross-country skiing. °Vigorous competitive sports, such as football, basketball, and soccer. °Jumping rope. °Aerobic dancing. °What are some everyday activities that can help me get exercise? °Yard work, such as: °Pushing a lawn mower. °Raking and bagging leaves. °Washing your car. °Pushing a stroller. °Shoveling snow. °Gardening. °Washing windows or floors. °How can I be more active in my day-to-day activities? °Use stairs instead of an elevator. °Take a walk during your lunch break. °If you drive, park your car farther away from your work or school. °If you take public transportation, get off one stop early and walk the rest of the way. °Stand up or walk around during all of your indoor phone calls. °Get up, stretch, and walk around every 30 minutes throughout the day. °Enjoy exercise with a friend. Support to continue exercising will help you keep a regular routine of activity. °Where to find more information °You can find more information about exercising to stay healthy from: °U.S. Department of Health and Human Services: www.hhs.gov °Centers for Disease Control and Prevention (  CDC): www.cdc.gov °Summary °Exercising regularly is  important. It will improve your overall fitness, flexibility, and endurance. °Regular exercise will also improve your overall health. It can help you control your weight, reduce stress, and improve your bone density. °Do not exercise so much that you hurt yourself, feel dizzy, or get very short of breath. °Before you start a new exercise program, talk with your health care provider. °This information is not intended to replace advice given to you by your health care provider. Make sure you discuss any questions you have with your health care provider. °Document Revised: 12/29/2020 Document Reviewed: 12/29/2020 °Elsevier Patient Education © 2022 [employer] Inc. ° °

## 2021-06-20 LAB — LIPID PANEL
Chol/HDL Ratio: 5 ratio — ABNORMAL HIGH (ref 0.0–4.4)
Cholesterol, Total: 166 mg/dL (ref 100–199)
HDL: 33 mg/dL — ABNORMAL LOW (ref >39)
LDL Chol Calc (NIH): 91 mg/dL (ref 0–99)
Triglycerides: 252 mg/dL — ABNORMAL HIGH (ref 0–149)
VLDL Cholesterol Cal: 42 mg/dL — ABNORMAL HIGH (ref 5–40)

## 2021-06-20 LAB — CMP14+EGFR
ALT: 22 IU/L (ref 0–32)
AST: 16 IU/L (ref 0–40)
Albumin/Globulin Ratio: 2.4 — ABNORMAL HIGH (ref 1.2–2.2)
Albumin: 4.8 g/dL (ref 3.8–4.8)
Alkaline Phosphatase: 67 IU/L (ref 44–121)
BUN/Creatinine Ratio: 12 (ref 9–23)
BUN: 11 mg/dL (ref 6–20)
Bilirubin Total: <0.2 mg/dL (ref 0.0–1.2)
CO2: 16 mmol/L — ABNORMAL LOW (ref 20–29)
Calcium: 9.7 mg/dL (ref 8.7–10.2)
Chloride: 104 mmol/L (ref 96–106)
Creatinine, Ser: 0.89 mg/dL (ref 0.57–1.00)
Globulin, Total: 2 g/dL (ref 1.5–4.5)
Glucose: 86 mg/dL (ref 70–99)
Potassium: 5.1 mmol/L (ref 3.5–5.2)
Sodium: 139 mmol/L (ref 134–144)
Total Protein: 6.8 g/dL (ref 6.0–8.5)
eGFR: 85 mL/min/{1.73_m2} (ref >59)

## 2021-06-20 LAB — CBC WITH DIFFERENTIAL/PLATELET
Basophils Absolute: 0 10*3/uL (ref 0.0–0.2)
Basos: 0 %
EOS (ABSOLUTE): 0.2 10*3/uL (ref 0.0–0.4)
Eos: 2 %
Hematocrit: 39.2 % (ref 34.0–46.6)
Hemoglobin: 13.3 g/dL (ref 11.1–15.9)
Immature Grans (Abs): 0 10*3/uL (ref 0.0–0.1)
Immature Granulocytes: 0 %
Lymphocytes Absolute: 2.8 10*3/uL (ref 0.7–3.1)
Lymphs: 26 %
MCH: 30 pg (ref 26.6–33.0)
MCHC: 33.9 g/dL (ref 31.5–35.7)
MCV: 89 fL (ref 79–97)
Monocytes Absolute: 0.5 10*3/uL (ref 0.1–0.9)
Monocytes: 5 %
Neutrophils Absolute: 7 10*3/uL (ref 1.4–7.0)
Neutrophils: 67 %
Platelets: 297 10*3/uL (ref 150–450)
RBC: 4.43 x10E6/uL (ref 3.77–5.28)
RDW: 12.7 % (ref 11.7–15.4)
WBC: 10.5 10*3/uL (ref 3.4–10.8)

## 2021-07-13 ENCOUNTER — Other Ambulatory Visit: Payer: Self-pay | Admitting: Nurse Practitioner

## 2021-07-13 DIAGNOSIS — M25561 Pain in right knee: Secondary | ICD-10-CM

## 2021-07-16 ENCOUNTER — Ambulatory Visit (INDEPENDENT_AMBULATORY_CARE_PROVIDER_SITE_OTHER): Payer: Medicare Other

## 2021-07-16 VITALS — Ht 63.0 in | Wt 229.0 lb

## 2021-07-16 DIAGNOSIS — Z0000 Encounter for general adult medical examination without abnormal findings: Secondary | ICD-10-CM

## 2021-07-16 NOTE — Progress Notes (Signed)
Subjective:   Ashlee Hoffman is a 40 y.o. female who presents for Medicare Annual (Subsequent) preventive examination. Virtual Visit via Telephone Note  I connected with  Ashlee Hoffman on 07/16/21 at  1:15 PM EDT by telephone and verified that I am speaking with the correct person using two identifiers.  Location: Patient: Home Provider: [facility] Persons participating in the virtual visit: patient/Nurse Health Advisor   I discussed the limitations, risks, security and privacy concerns of performing an evaluation and management service by telephone and the availability of in person appointments. The patient expressed understanding and agreed to proceed.  Interactive audio and video telecommunications were attempted between this nurse and patient, however failed, due to patient having technical difficulties OR patient did not have access to video capability.  We continued and completed visit with audio only.  Some vital signs may be absent or patient reported.   Chriss Driver, LPN  Review of Systems     Cardiac Risk Factors include: hypertension;obesity (BMI >30kg/m2);sedentary lifestyle     Objective:    Today's Vitals   07/16/21 1314  Weight: 229 lb (103.9 kg)  Height: 5\' 3"  (1.6 m)   Body mass index is 40.57 kg/m.  Advanced Directives 07/16/2021 12/19/2020 07/05/2020 06/10/2016 12/20/2014 12/20/2014 12/20/2014  Does Patient Have a Medical Advance Directive? No No No Yes Yes No -  Type of Advance Directive - - - Healthcare Power of [place]  Does patient want to make changes to medical advance directive? - - - No - Patient declined - - -  Copy of [place] in Chart? - - - No - copy requested No - copy requested - No - copy requested  Would patient like information on creating a medical advance directive? No - Patient declined - - - - - -  Pre-existing out of facility DNR order (yellow form or pink MOST form)  - - - - - - -    Current Medications (verified) Outpatient Encounter Medications as of 07/16/2021  Medication Sig   Acetaminophen (TYLENOL ARTHRITIS EXT RELIEF PO) Take 1-2 tablets by mouth as needed. Reported on 01/12/2016   acyclovir ointment (ZOVIRAX) 5 % Apply 1 application topically every 2 (two) hours as needed.   diclofenac Sodium (VOLTAREN) 1 % GEL Apply 2 g topically 4 (four) times daily. Rub into affected area of foot 2 to 4 times daily   ketoconazole (NIZORAL) 2 % cream Apply 1 application topically daily.   lidocaine (LIDODERM) 5 % Place 1 patch onto the skin daily. Remove & Discard patch within 12 hours or as directed by MD   meloxicam (MOBIC) 15 MG tablet TAKE 1 TABLET BY MOUTH EVERY DAY   omeprazole (PRILOSEC) 40 MG capsule Take 1 capsule (40 mg total) by mouth daily. (NEEDS TO BE SEEN BEFORE NEXT REFILL)   No facility-administered encounter medications on file as of 07/16/2021.    Allergies (verified) Septra [sulfamethoxazole-trimethoprim], Sulfamethoxazole, and Trimethoprim   History: Past Medical History:  Diagnosis Date   Bulging lumbar disc 05/2013   Mental disability    Pure hypercholesterolemia 06/19/2021   Shortness of breath    with exertion   Past Surgical History:  Procedure Laterality Date   LUMBAR LAMINECTOMY/DECOMPRESSION MICRODISCECTOMY N/A 05/24/2013   Procedure: LUMBAR LAMINECTOMY/DECOMPRESSION MICRODISCECTOMY 1 LEVEL LEFT LUMBAR FIVE-SACARAL-ONE;  Surgeon: Charlie Pitter, MD;  Location: [facility];  Service: Neurosurgery;  Laterality: N/A;  left   Family History  Problem Relation Age of Onset   Hypertension Mother    Arthritis Mother    Diabetes Maternal Grandfather    Hypertension Maternal Grandfather    Heart disease Maternal Grandfather    Social History   Socioeconomic History   Marital status: Single    Spouse name: Not on file   Number of children: Not on file   Years of education: Not on file   Highest education level: Not on file   Occupational History   Not on file  Tobacco Use   Smoking status: Never   Smokeless tobacco: Never  Substance and Sexual Activity   Alcohol use: No   Drug use: No   Sexual activity: Never  Other Topics Concern   Not on file  Social History Narrative   Not on file   Social Determinants of Health   Financial Resource Strain: Low Risk    Difficulty of Paying Living Expenses: Not hard at all  Food Insecurity: No Food Insecurity   Worried About Charity fundraiser in the Last Year: Never true   [place] in the Last Year: Never true  Transportation Needs: No Transportation Needs   Lack of Transportation (Medical): No   Lack of Transportation (Non-Medical): No  Physical Activity: Insufficiently Active   Days of Exercise per Week: 3 days   Minutes of Exercise per Session: 20 min  Stress: No Stress Concern Present   Feeling of Stress : Not at all  Social Connections: Moderately Integrated   Frequency of Communication with Friends and Family: More than three times a week   Frequency of Social Gatherings with Friends and Family: More than three times a week   Attends Religious Services: 1 to 4 times per year   Active Member of [employer] or Organizations: Yes   Attends Archivist Meetings: 1 to 4 times per year   Marital Status: Never married    Tobacco Counseling Counseling given: Not Answered   Clinical Intake:  Pre-visit preparation completed: Yes  Pain : No/denies pain     BMI - recorded: 40.57 Nutritional Status: BMI > 30  Obese Nutritional Risks: None Diabetes: No  How often do you need to have someone help you when you read instructions, pamphlets, or other written materials from your doctor or pharmacy?: 1 - Never  Diabetic?no  Interpreter Needed?: No  Information entered by :: MJ Estera Ozier, LPN   Activities of Daily Living In your present state of health, do you have any difficulty performing the following activities: 07/16/2021  Hearing? N   Vision? N  Difficulty concentrating or making decisions? Y  Comment Due to cognitive issues.  Walking or climbing stairs? N  Dressing or bathing? N  Doing errands, shopping? N  Preparing Food and eating ? N  Using the Toilet? N  In the past six months, have you accidently leaked urine? N  Do you have problems with loss of bowel control? N  Managing your Medications? N  Managing your Finances? N  Housekeeping or managing your Housekeeping? N  Some recent data might be hidden    Patient Care Team: Chevis Pretty, FNP as PCP - General (Nurse Practitioner) Earnie Larsson, MD as Consulting Physician (Neurosurgery)  Indicate any recent Medical Services you may have received from other than [facility] providers in the past year (date may be approximate).     Assessment:   This is a routine wellness examination for [place].  Hearing/Vision screen Hearing Screening - Comments:: No hearing  issues.  Vision Screening - Comments:: Glasses. 2021. [facility].  Dietary issues and exercise activities discussed: Current Exercise Habits: Home exercise routine, Type of exercise: walking, Time (Minutes): 20, Frequency (Times/Week): 3, Weekly Exercise (Minutes/Week): 60, Intensity: Mild, Exercise limited by: cardiac condition(s);psychological condition(s)   Goals Addressed             This Visit's Progress    Exercise 3x per week (30 min per time)         Depression Screen PHQ 2/9 Scores 07/16/2021 06/19/2021 11/20/2020 01/05/2020 05/17/2019 11/13/2016 10/15/2016  PHQ - 2 Score 0 0 0 0 0 0 0  PHQ- 9 Score 0 0 - - - - -    Fall Risk Fall Risk  07/16/2021 06/19/2021 11/20/2020 01/05/2020 05/17/2019  Falls in the past year? 0 0 0 0 0  Number falls in past yr: 0 - - - -  Injury with Fall? 0 - - - -  Risk for fall due to : Impaired balance/gait - - - -  Follow up Falls prevention discussed - - - -    FALL RISK PREVENTION PERTAINING TO THE HOME:  Any stairs in or around the home? Yes  If  so, are there any without handrails? No  Home free of loose throw rugs in walkways, pet beds, electrical cords, etc? Yes  Adequate lighting in your home to reduce risk of falls? Yes   ASSISTIVE DEVICES UTILIZED TO PREVENT FALLS:  Life alert? No  Use of a cane, walker or w/c? No  Grab bars in the bathroom? No  Shower chair or bench in shower? No  Elevated toilet seat or a handicapped toilet? No   TIMED UP AND GO:  Was the test performed? No . Phone visit.   Cognitive Function: Patient has current diagnosis of cognitive impairment. 07/16/2021. Patient is unable to complete screening 6CIT or MMSE.   MMSE - Mini Mental State Exam 06/10/2016 12/20/2014  Not completed: Unable to complete Refused  Orientation to time - 4  Orientation to Place - 3  Registration - 3  Attention/ Calculation - 0  Recall - 1  Language- name 2 objects - 2  Language- repeat - 1  Language- follow 3 step command - 1  Language- read & follow direction - 1  Write a sentence - 1  Copy design - 0  Total score - 17        Immunizations Immunization History  Administered Date(s) Administered   Tdap 12/20/2014    TDAP status: Up to date  Flu Vaccine status: Due, Education has been provided regarding the importance of this vaccine. Advised may receive this vaccine at local pharmacy or Health Dept. Aware to provide a copy of the vaccination record if obtained from local pharmacy or Health Dept. Verbalized acceptance and understanding.  Pneumococcal vaccine status: Declined,  Education has been provided regarding the importance of this vaccine but patient still declined. Advised may receive this vaccine at local pharmacy or Health Dept. Aware to provide a copy of the vaccination record if obtained from local pharmacy or Health Dept. Verbalized acceptance and understanding.   Covid-19 vaccine status: Information provided on how to obtain vaccines.   Qualifies for Shingles Vaccine? No   Zostavax completed No    Shingrix Completed?: No.    Education has been provided regarding the importance of this vaccine. Patient has been advised to call insurance company to determine out of pocket expense if they have not yet received this vaccine. Advised  may also receive vaccine at local pharmacy or Health Dept. Verbalized acceptance and understanding.  Screening Tests Health Maintenance  Topic Date Due   COVID-19 Vaccine (1) Never done   PAP SMEAR-Modifier  Never done   Hepatitis C Screening  11/20/2021 (Originally 02/22/2000)   HIV Screening  11/20/2021 (Originally 02/21/1997)   INFLUENZA VACCINE  12/14/2021 (Originally 04/16/2021)   TETANUS/TDAP  12/19/2024   Pneumococcal Vaccine 68-17 Years old  Aged Out   HPV VACCINES  Aged Out    Health Maintenance  Health Maintenance Due  Topic Date Due   COVID-19 Vaccine (1) Never done   PAP SMEAR-Modifier  Never done    Colorectal cancer screening: No longer required.  Not required due to patient's age.  Mammogram status: No longer required due to patient's age. Due again at age 48.  Bone Density status: Ordered NOT REQUIRED DUE TO PATIENT'S AGE. Pt provided with contact info and advised to call to schedule appt.  Lung Cancer Screening: (Low Dose CT Chest recommended if Age 54-80 years, 30 pack-year currently smoking OR have quit w/in 15years.) does not qualify.     Additional Screening:  Hepatitis C Screening: does not qualify.  Vision Screening: Recommended annual ophthalmology exams for early detection of glaucoma and other disorders of the eye. Is the patient up to date with their annual eye exam?  Yes  Who is the provider or what is the name of the office in which the patient attends annual eye exams? [facility] If pt is not established with a provider, would they like to be referred to a provider to establish care? No .   Dental Screening: Recommended annual dental exams for proper oral hygiene  Community Resource Referral / Chronic Care  Management: CRR required this visit?  No   CCM required this visit?  No      Plan:     I have personally reviewed and noted the following in the patient's chart:   Medical and social history Use of alcohol, tobacco or illicit drugs  Current medications and supplements including opioid prescriptions.  Functional ability and status Nutritional status Physical activity Advanced directives List of other physicians Hospitalizations, surgeries, and ER visits in previous 12 months Vitals Screenings to include cognitive, depression, and falls Referrals and appointments  In addition, I have reviewed and discussed with patient certain preventive protocols, quality metrics, and best practice recommendations. A written personalized care plan for preventive services as well as general preventive health recommendations were provided to patient.     Chriss Driver, LPN   [id number]   Nurse Notes: Visit done with patient and patient's mother, Ashlee Hoffman. Pt states she is doing well. No falls. Mammogram due at age 23. Had baseline at age 46 per mother. Declines covid vaccine.

## 2021-07-16 NOTE — Patient Instructions (Signed)
Ashlee Hoffman , Thank you for taking time to come for your Medicare Wellness Visit. I appreciate your ongoing commitment to your health goals. Please review the following plan we discussed and let me know if I can assist you in the future.   Screening recommendations/referrals: Colonoscopy: Not due until age 39 Mammogram: Not due until age 88 Bone Density: Not due until age 95  Recommended yearly ophthalmology/optometry visit for glaucoma screening and checkup Recommended yearly dental visit for hygiene and checkup  Vaccinations: Influenza vaccine: Due. Repeat annually  Pneumococcal vaccine: Not due until age 106 Tdap vaccine: Done 12/30/2014 Repeat in 10 years  Shingles vaccine: Not due until age 43.  Covid-19: Declined.   Advanced directives: Advance directive discussed with you today. Even though you declined this today, please call our office should you change your mind, and we can give you the proper paperwork for you to fill out.   Conditions/risks identified: Aim for 30 minutes of exercise or walking each day, drink 6-8 glasses of water and eat lots of fruits and vegetables.   Next appointment: Follow up in one year for your annual wellness visit. 2023.  Preventive Care 40-64 Years, Female Preventive care refers to lifestyle choices and visits with your health care provider that can promote health and wellness. What does preventive care include? A yearly physical exam. This is also called an annual well check. Dental exams once or twice a year. Routine eye exams. Ask your health care provider how often you should have your eyes checked. Personal lifestyle choices, including: Daily care of your teeth and gums. Regular physical activity. Eating a healthy diet. Avoiding tobacco and drug use. Limiting alcohol use. Practicing safe sex. Taking low-dose aspirin daily starting at age 74. Taking vitamin and mineral supplements as recommended by your health care provider. What  happens during an annual well check? The services and screenings done by your health care provider during your annual well check will depend on your age, overall health, lifestyle risk factors, and family history of disease. Counseling  Your health care provider may ask you questions about your: Alcohol use. Tobacco use. Drug use. Emotional well-being. Home and relationship well-being. Sexual activity. Eating habits. Work and work Statistician. Method of birth control. Menstrual cycle. Pregnancy history. Screening  You may have the following tests or measurements: Height, weight, and BMI. Blood pressure. Lipid and cholesterol levels. These may be checked every 5 years, or more frequently if you are over 63 years old. Skin check. Lung cancer screening. You may have this screening every year starting at age 33 if you have a 30-pack-year history of smoking and currently smoke or have quit within the past 15 years. Fecal occult blood test (FOBT) of the stool. You may have this test every year starting at age 59. Flexible sigmoidoscopy or colonoscopy. You may have a sigmoidoscopy every 5 years or a colonoscopy every 10 years starting at age 82. Hepatitis C blood test. Hepatitis B blood test. Sexually transmitted disease (STD) testing. Diabetes screening. This is done by checking your blood sugar (glucose) after you have not eaten for a while (fasting). You may have this done every 1-3 years. Mammogram. This may be done every 1-2 years. Talk to your health care provider about when you should start having regular mammograms. This may depend on whether you have a family history of breast cancer. BRCA-related cancer screening. This may be done if you have a family history of breast, ovarian, tubal, or peritoneal cancers. Pelvic exam  and Pap test. This may be done every 3 years starting at age 44. Starting at age 38, this may be done every 5 years if you have a Pap test in combination with an HPV  test. Bone density scan. This is done to screen for osteoporosis. You may have this scan if you are at high risk for osteoporosis. Discuss your test results, treatment options, and if necessary, the need for more tests with your health care provider. Vaccines  Your health care provider may recommend certain vaccines, such as: Influenza vaccine. This is recommended every year. Tetanus, diphtheria, and acellular pertussis (Tdap, Td) vaccine. You may need a Td booster every 10 years. Zoster vaccine. You may need this after age 70. Pneumococcal 13-valent conjugate (PCV13) vaccine. You may need this if you have certain conditions and were not previously vaccinated. Pneumococcal polysaccharide (PPSV23) vaccine. You may need one or two doses if you smoke cigarettes or if you have certain conditions. Talk to your health care provider about which screenings and vaccines you need and how often you need them. This information is not intended to replace advice given to you by your health care provider. Make sure you discuss any questions you have with your health care provider. Document Released: 09/29/2015 Document Revised: 05/22/2016 Document Reviewed: 07/04/2015 Elsevier Interactive Patient Education  2017 [place] Prevention in the Home Falls can cause injuries. They can happen to people of all ages. There are many things you can do to make your home safe and to help prevent falls. What can I do on the outside of my home? Regularly fix the edges of walkways and driveways and fix any cracks. Remove anything that might make you trip as you walk through a door, such as a raised step or threshold. Trim any bushes or trees on the path to your home. Use bright outdoor lighting. Clear any walking paths of anything that might make someone trip, such as rocks or tools. Regularly check to see if handrails are loose or broken. Make sure that both sides of any steps have handrails. Any raised  decks and porches should have guardrails on the edges. Have any leaves, snow, or ice cleared regularly. Use sand or salt on walking paths during winter. Clean up any spills in your garage right away. This includes oil or grease spills. What can I do in the bathroom? Use night lights. Install grab bars by the toilet and in the tub and shower. Do not use towel bars as grab bars. Use non-skid mats or decals in the tub or shower. If you need to sit down in the shower, use a plastic, non-slip stool. Keep the floor dry. Clean up any water that spills on the floor as soon as it happens. Remove soap buildup in the tub or shower regularly. Attach bath mats securely with double-sided non-slip rug tape. Do not have throw rugs and other things on the floor that can make you trip. What can I do in the bedroom? Use night lights. Make sure that you have a light by your bed that is easy to reach. Do not use any sheets or blankets that are too big for your bed. They should not hang down onto the floor. Have a firm chair that has side arms. You can use this for support while you get dressed. Do not have throw rugs and other things on the floor that can make you trip. What can I do in the kitchen? Clean up  any spills right away. Avoid walking on wet floors. Keep items that you use a lot in easy-to-reach places. If you need to reach something above you, use a strong step stool that has a grab bar. Keep electrical cords out of the way. Do not use floor polish or wax that makes floors slippery. If you must use wax, use non-skid floor wax. Do not have throw rugs and other things on the floor that can make you trip. What can I do with my stairs? Do not leave any items on the stairs. Make sure that there are handrails on both sides of the stairs and use them. Fix handrails that are broken or loose. Make sure that handrails are as long as the stairways. Check any carpeting to make sure that it is firmly attached  to the stairs. Fix any carpet that is loose or worn. Avoid having throw rugs at the top or bottom of the stairs. If you do have throw rugs, attach them to the floor with carpet tape. Make sure that you have a light switch at the top of the stairs and the bottom of the stairs. If you do not have them, ask someone to add them for you. What else can I do to help prevent falls? Wear shoes that: Do not have high heels. Have rubber bottoms. Are comfortable and fit you well. Are closed at the toe. Do not wear sandals. If you use a stepladder: Make sure that it is fully opened. Do not climb a closed stepladder. Make sure that both sides of the stepladder are locked into place. Ask someone to hold it for you, if possible. Clearly mark and make sure that you can see: Any grab bars or handrails. First and last steps. Where the edge of each step is. Use tools that help you move around (mobility aids) if they are needed. These include: Canes. Walkers. Scooters. Crutches. Turn on the lights when you go into a dark area. Replace any light bulbs as soon as they burn out. Set up your furniture so you have a clear path. Avoid moving your furniture around. If any of your floors are uneven, fix them. If there are any pets around you, be aware of where they are. Review your medicines with your doctor. Some medicines can make you feel dizzy. This can increase your chance of falling. Ask your doctor what other things that you can do to help prevent falls. This information is not intended to replace advice given to you by your health care provider. Make sure you discuss any questions you have with your health care provider. Document Released: 06/29/2009 Document Revised: 02/08/2016 Document Reviewed: 10/07/2014 Elsevier Interactive Patient Education  2017 [employer].

## 2021-07-24 ENCOUNTER — Other Ambulatory Visit: Payer: Self-pay

## 2021-07-24 ENCOUNTER — Encounter: Payer: Self-pay | Admitting: Podiatry

## 2021-07-24 ENCOUNTER — Ambulatory Visit (INDEPENDENT_AMBULATORY_CARE_PROVIDER_SITE_OTHER): Payer: Medicare Other | Admitting: Podiatry

## 2021-07-24 DIAGNOSIS — G8929 Other chronic pain: Secondary | ICD-10-CM | POA: Diagnosis not present

## 2021-07-24 DIAGNOSIS — B351 Tinea unguium: Secondary | ICD-10-CM

## 2021-07-24 DIAGNOSIS — M25571 Pain in right ankle and joints of right foot: Secondary | ICD-10-CM | POA: Diagnosis not present

## 2021-07-24 DIAGNOSIS — M79674 Pain in right toe(s): Secondary | ICD-10-CM | POA: Diagnosis not present

## 2021-07-24 DIAGNOSIS — S93401D Sprain of unspecified ligament of right ankle, subsequent encounter: Secondary | ICD-10-CM | POA: Diagnosis not present

## 2021-07-24 NOTE — Progress Notes (Signed)
Subjective: 40 year old female presents the office today for concerns of thick elongated toenails as well as calluses.  Denies any open lesions.  She still having some discomfort with her ankle on the right side.  She is previously on physical therapy.  She does wear a brace when she is outside on uneven surfaces.  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  No recent injury or changes otherwise.  Objective: NAD DP/PT pulses palpable bilaterally, CRT less than 3 seconds Nails are hypertrophic, dystrophic, brittle, discolored, elongated 10. No surrounding redness or drainage. Tenderness nails 1-5 bilaterally.  Minimal hyperkeratotic lesions medial hallux without any underlying ulceration drainage or signs of infection. Difficult to tell, able to elicit any significant tenderness palpation of the lateral aspect of the foot today.  No discomfort at the metatarsal, lateral ankle complex.  Slight discomfort on the sinus tarsi with minimal edema.  There is no erythema.  No tenderness to the actual ankle joint itself today.  Flexor, extensor tendons appear to be intact. No pain with calf compression, swelling, warmth, erythema  Assessment: Chronic right ankle pain, ankle sprain; symptomatic onychomycosis, hyperkeratotic lesion  Plan: -All treatment options discussed with the patient including all alternatives, risks, complications.  -At this point I recommended wearing the brace and doing more stretching, rehab exercises on a regular basis.  Continue with the topical anti-inflammatories.  If needed we can try a steroid injection of the sinus tarsi as well as where the majority of tenderness is localized.  We discussed shoe modifications and when she is more of a hightop to avoid any ankle rolling.  I do think that her symptoms are multifactorial from the previous injuries as well as the way she walks. -Debrided nails x10 without any complications or bleeding -Debrided  hyperkeratotic lesions x2 without any complications or bleeding  Trula Slade DPM

## 2021-07-26 DIAGNOSIS — M5414 Radiculopathy, thoracic region: Secondary | ICD-10-CM | POA: Diagnosis not present

## 2021-08-02 ENCOUNTER — Telehealth: Payer: Self-pay | Admitting: Podiatry

## 2021-08-02 NOTE — Telephone Encounter (Signed)
-----   Message from Ashlee Hoffman, DPM sent at 08/01/2021  8:54 AM EST ----- Can you please let her mom know. Thanks.  ----- Message ----- From: Jackquline Denmark Sent: 07/27/2021   8:17 AM EST To: Ashlee Hoffman, DPM  They only cover if diabetic that I am aware of. I have not seen any get covered if not. ----- Message ----- From: Ashlee Hoffman, DPM Sent: 07/27/2021   6:27 AM EST To: Ashlee Hoffman  Question-is there any way for [employer] and Medicaid secondary to get issue if they are not diabetic?  I figured I would ask as her mom asked me at the appointment.  Thank you

## 2021-08-02 NOTE — Telephone Encounter (Signed)
Left message for pts mom that medicare does not cover diabetic shoes unless pt is a diabetic and to call if any questions or to discuss further.

## 2021-08-17 ENCOUNTER — Other Ambulatory Visit (HOSPITAL_COMMUNITY): Payer: Self-pay | Admitting: Physician Assistant

## 2021-08-17 ENCOUNTER — Other Ambulatory Visit: Payer: Self-pay | Admitting: Physician Assistant

## 2021-08-17 DIAGNOSIS — M25561 Pain in right knee: Secondary | ICD-10-CM

## 2021-08-17 DIAGNOSIS — M2241 Chondromalacia patellae, right knee: Secondary | ICD-10-CM | POA: Diagnosis not present

## 2021-08-20 DIAGNOSIS — M5414 Radiculopathy, thoracic region: Secondary | ICD-10-CM | POA: Diagnosis not present

## 2021-09-03 ENCOUNTER — Ambulatory Visit (HOSPITAL_COMMUNITY): Payer: Medicare Other

## 2021-09-18 ENCOUNTER — Other Ambulatory Visit: Payer: Self-pay

## 2021-09-18 ENCOUNTER — Encounter: Payer: Self-pay | Admitting: Family

## 2021-09-18 ENCOUNTER — Ambulatory Visit (INDEPENDENT_AMBULATORY_CARE_PROVIDER_SITE_OTHER): Payer: Commercial Managed Care - HMO | Admitting: Family

## 2021-09-18 ENCOUNTER — Ambulatory Visit (HOSPITAL_COMMUNITY)
Admission: RE | Admit: 2021-09-18 | Discharge: 2021-09-18 | Disposition: A | Discharge status: Home / Self Care | Payer: Medicare Other | Source: Ambulatory Visit | Attending: Physician Assistant | Admitting: Physician Assistant

## 2021-09-18 VITALS — BP 138/85 | HR 70 | Temp 98.0°F | Ht 63.0 in | Wt 232.4 lb

## 2021-09-18 DIAGNOSIS — J019 Acute sinusitis, unspecified: Secondary | ICD-10-CM | POA: Diagnosis not present

## 2021-09-18 DIAGNOSIS — M25561 Pain in right knee: Secondary | ICD-10-CM | POA: Insufficient documentation

## 2021-09-18 MED ORDER — AMOXICILLIN-POT CLAVULANATE 875-125 MG PO TABS: 1.0000 | ORAL_TABLET | Freq: Two times a day (BID) | ORAL | 0 refills | Status: DC

## 2021-09-18 MED ORDER — BENZONATATE 200 MG PO CAPS: 200.0000 mg | ORAL_CAPSULE | Freq: Three times a day (TID) | ORAL | 1 refills | Status: DC | PRN

## 2021-09-18 NOTE — Patient Instructions (Signed)

## 2021-09-18 NOTE — Progress Notes (Signed)
Subjective:    Patient ID: Ashlee Hoffman, female    DOB: [birth year], 41 y.o.   MRN: [id number]  Chief Complaint  Patient presents with   Sinus Problem    3 weeks     Sinus Problem This is a new problem. The problem has been gradually worsening since onset. There has been no fever. Her pain is at a severity of 5/10. The pain is moderate. Associated symptoms include congestion, coughing, headaches, a hoarse voice, sinus pressure and sneezing. Pertinent negatives include no sore throat. Past treatments include acetaminophen. The treatment provided mild relief.     Review of Systems  HENT:  Positive for congestion, hoarse voice, sinus pressure and sneezing. Negative for sore throat.   Respiratory:  Positive for cough.   Neurological:  Positive for headaches.  All other systems reviewed and are negative.     Objective:   Physical Exam Vitals reviewed.  Constitutional:      General: She is not in acute distress.    Appearance: She is well-developed. She is obese.  HENT:     Head: Normocephalic and atraumatic.     Right Ear: Tympanic membrane normal.     Left Ear: Tympanic membrane normal.  Eyes:     Pupils: Pupils are equal, round, and reactive to light.  Neck:     Thyroid: No thyromegaly.  Cardiovascular:     Rate and Rhythm: Normal rate and regular rhythm.     Heart sounds: Normal heart sounds. No murmur heard. Pulmonary:     Effort: Pulmonary effort is normal. No respiratory distress.     Breath sounds: Normal breath sounds. No wheezing.  Abdominal:     General: Bowel sounds are normal. There is no distension.     Palpations: Abdomen is soft.     Tenderness: There is no abdominal tenderness.  Musculoskeletal:        General: No tenderness. Normal range of motion.     Cervical back: Normal range of motion and neck supple.  Skin:    General: Skin is warm and dry.  Neurological:     Mental Status: She is alert and oriented to person, place, and time.     Cranial Nerves:  No cranial nerve deficit.     Deep Tendon Reflexes: Reflexes are normal and symmetric.  Psychiatric:        Behavior: Behavior normal.        Thought Content: Thought content normal.        Judgment: Judgment normal.     BP 138/85    Pulse 70    Temp 98 F (36.7 C) (Temporal)    Ht 5\' 3"  (1.6 m)    Wt 232 lb 6.4 oz (105.4 kg)    BMI 41.17 kg/m       Assessment & Plan:  Ashlee Hoffman comes in today with chief complaint of Sinus Problem (3 weeks )   Diagnosis and orders addressed:  1. Acute sinusitis, recurrence not specified, unspecified location - Take meds as prescribed - Use a cool mist humidifier  -Use saline nose sprays frequently -Force fluids -For any cough or congestion  Use plain Mucinex- regular strength or max strength is fine -For fever or aces or pains- take tylenol or ibuprofen. -Throat lozenges if help -Follow up if no improvements  - amoxicillin-clavulanate (AUGMENTIN) 875-125 MG tablet; Take 1 tablet by mouth 2 (two) times daily.  Dispense: 14 tablet; Refill: 0 - benzonatate (TESSALON) 200 MG capsule; Take 1 capsule (200  mg total) by mouth 3 (three) times daily as needed.  Dispense: 30 capsule; Refill: [place], FNP

## 2021-09-24 ENCOUNTER — Ambulatory Visit (INDEPENDENT_AMBULATORY_CARE_PROVIDER_SITE_OTHER): Payer: Commercial Managed Care - HMO | Admitting: Podiatry

## 2021-09-24 ENCOUNTER — Other Ambulatory Visit: Payer: Self-pay

## 2021-09-24 DIAGNOSIS — B351 Tinea unguium: Secondary | ICD-10-CM | POA: Diagnosis not present

## 2021-09-24 DIAGNOSIS — M79674 Pain in right toe(s): Secondary | ICD-10-CM | POA: Diagnosis not present

## 2021-09-24 DIAGNOSIS — Q828 Other specified congenital malformations of skin: Secondary | ICD-10-CM

## 2021-09-24 DIAGNOSIS — M79676 Pain in unspecified toe(s): Secondary | ICD-10-CM | POA: Diagnosis not present

## 2021-09-25 ENCOUNTER — Ambulatory Visit: Payer: Medicare Other | Admitting: Podiatry

## 2021-09-26 DIAGNOSIS — M5414 Radiculopathy, thoracic region: Secondary | ICD-10-CM | POA: Diagnosis not present

## 2021-09-26 DIAGNOSIS — M1711 Unilateral primary osteoarthritis, right knee: Secondary | ICD-10-CM | POA: Diagnosis not present

## 2021-09-26 NOTE — Progress Notes (Signed)
Subjective: 41 year old female presents the office today for concerns of thick elongated toenails as well as calluses.  She states that her ankles been doing better and her mom states that she has not been complaining about the ankle or limping.  She does continue with ankle brace if outside on uneven surfaces but no new concerns otherwise.    Objective: NAD DP/PT pulses palpable bilaterally, CRT less than 3 seconds Nails are hypertrophic, dystrophic, brittle, discolored, elongated 10. No surrounding redness or drainage. Tenderness nails 1-5 bilaterally.  Minimal hyperkeratotic lesions medial hallux without any underlying ulceration drainage or signs of infection. Not able to elicit any area of tenderness particular the lateral aspect of the right ankle.  No pain in the foot.  No increase in swelling.  There is no redness.  Ankle, subtalar joint range of motion intact.  Flexor, extensor tendons appear to be intact.  MMT 5/5. No pain with calf compression, swelling, warmth, erythema  Assessment: Chronic right ankle pain, ankle sprain; symptomatic onychomycosis, hyperkeratotic lesion  Plan: -All treatment options discussed with the patient including all alternatives, risks, complications.  -Continue ankle brace as needed and we discussed exercises to continue with at home. -Debrided nails x10 without any complications or bleeding -Debrided hyperkeratotic lesions x2 without any complications or bleeding  Trula Slade DPM

## 2021-10-08 ENCOUNTER — Other Ambulatory Visit: Payer: Self-pay | Admitting: Nurse Practitioner

## 2021-10-08 DIAGNOSIS — M25561 Pain in right knee: Secondary | ICD-10-CM

## 2021-11-26 ENCOUNTER — Other Ambulatory Visit: Payer: Self-pay

## 2021-11-26 ENCOUNTER — Ambulatory Visit (INDEPENDENT_AMBULATORY_CARE_PROVIDER_SITE_OTHER): Payer: Medicare Other | Admitting: Podiatry

## 2021-11-26 DIAGNOSIS — M79674 Pain in right toe(s): Secondary | ICD-10-CM | POA: Diagnosis not present

## 2021-11-26 DIAGNOSIS — Q828 Other specified congenital malformations of skin: Secondary | ICD-10-CM | POA: Diagnosis not present

## 2021-11-26 DIAGNOSIS — B351 Tinea unguium: Secondary | ICD-10-CM

## 2021-11-27 NOTE — Progress Notes (Signed)
Subjective: ?41 year old female presents the office today for concerns of thick elongated toenails as well as calluses.  Her mom states that she has not been compliant better ankle pain.  Denies any new issues or injuries.  No fevers or chills. ? ?Objective: ?NAD ?DP/PT pulses palpable bilaterally, CRT less than 3 seconds ?Nails are hypertrophic, dystrophic, brittle, discolored, elongated ?10. No surrounding redness or drainage. Tenderness nails 1-5 bilaterally.  ?Minimal hyperkeratotic lesions medial hallux as well as submetatarsal 5 without any underlying ulceration drainage or signs of infection. ?Not able to elicit any area of tenderness particular the lateral aspect of the right ankle.  No pain in the foot.  No increase in swelling.  There is no redness.  Ankle, subtalar joint range of motion intact.  Flexor, extensor tendons appear to be intact.  MMT 5/5. ?No pain with calf compression, swelling, warmth, erythema ? ?Assessment: ?Chronic right ankle pain, ankle sprain; symptomatic onychomycosis, hyperkeratotic lesion ? ?Plan: ?-All treatment options discussed with the patient including all alternatives, risks, complications.  ?-Continue ankle brace as needed and we discussed exercises to continue with at home. ?-Debrided nails x10 without any complications or bleeding ?-Debrided hyperkeratotic lesions x4 without any complications or bleeding ?-Continue with supportive shoe gear to help protect ankle. ? ?Trula Slade DPM ? ? ? ?

## 2021-12-18 DIAGNOSIS — M1711 Unilateral primary osteoarthritis, right knee: Secondary | ICD-10-CM | POA: Diagnosis not present

## 2021-12-25 DIAGNOSIS — M1711 Unilateral primary osteoarthritis, right knee: Secondary | ICD-10-CM | POA: Diagnosis not present

## 2022-01-01 DIAGNOSIS — M1711 Unilateral primary osteoarthritis, right knee: Secondary | ICD-10-CM | POA: Diagnosis not present

## 2022-01-02 DIAGNOSIS — M5414 Radiculopathy, thoracic region: Secondary | ICD-10-CM | POA: Diagnosis not present

## 2022-01-03 ENCOUNTER — Other Ambulatory Visit: Payer: Self-pay | Admitting: Nurse Practitioner

## 2022-01-03 DIAGNOSIS — K219 Gastro-esophageal reflux disease without esophagitis: Secondary | ICD-10-CM

## 2022-01-04 ENCOUNTER — Other Ambulatory Visit: Payer: Self-pay | Admitting: Nurse Practitioner

## 2022-01-04 DIAGNOSIS — M25561 Pain in right knee: Secondary | ICD-10-CM

## 2022-01-28 ENCOUNTER — Ambulatory Visit (INDEPENDENT_AMBULATORY_CARE_PROVIDER_SITE_OTHER): Payer: Medicare Other | Admitting: Podiatry

## 2022-01-28 DIAGNOSIS — B351 Tinea unguium: Secondary | ICD-10-CM | POA: Diagnosis not present

## 2022-01-28 DIAGNOSIS — M79674 Pain in right toe(s): Secondary | ICD-10-CM

## 2022-01-28 DIAGNOSIS — Q828 Other specified congenital malformations of skin: Secondary | ICD-10-CM

## 2022-01-30 NOTE — Progress Notes (Signed)
Subjective: ?41 year old female presents the office today for concerns of thick elongated toenails as well as calluses.  Calluses of and burning.  Denies any new issues or injuries.  No fevers or chills. ? ?Objective: ?NAD ?DP/PT pulses palpable bilaterally, CRT less than 3 seconds ?Nails are hypertrophic, dystrophic, brittle, discolored, elongated ?10. No surrounding redness or drainage. Tenderness nails 1-5 bilaterally.  ?Hyperkeratotic lesions medial hallux as well as submetatarsal 5 without any underlying ulceration drainage or signs of infection. ?Interdigital macerations present.  No drainage or pus. ?No pain with calf compression, swelling, warmth, erythema ? ?Assessment: ?Chronic right ankle pain, ankle sprain; symptomatic onychomycosis, hyperkeratotic lesion ? ?Plan: ?-All treatment options discussed with the patient including all alternatives, risks, complications.  ?-Discussed drying thoroughly between the toes.  Continue antifungal medication that she has. ?-Debrided nails x10 without any complications or bleeding ?-Debrided hyperkeratotic lesions x4 without any complications or bleeding ?-Continue with supportive shoe gear to help protect ankle. ? ?Trula Slade DPM ? ? ? ?

## 2022-02-01 ENCOUNTER — Encounter: Payer: Self-pay | Admitting: Nurse Practitioner

## 2022-02-01 ENCOUNTER — Other Ambulatory Visit: Payer: Self-pay | Admitting: Nurse Practitioner

## 2022-02-01 DIAGNOSIS — K219 Gastro-esophageal reflux disease without esophagitis: Secondary | ICD-10-CM

## 2022-02-01 NOTE — Telephone Encounter (Signed)
MMM NTBS 30 days given 01/03/22

## 2022-02-01 NOTE — Telephone Encounter (Signed)
NA//mailed letter

## 2022-02-02 ENCOUNTER — Other Ambulatory Visit: Payer: Self-pay | Admitting: Nurse Practitioner

## 2022-02-02 DIAGNOSIS — F79 Unspecified intellectual disabilities: Secondary | ICD-10-CM

## 2022-02-15 ENCOUNTER — Telehealth: Payer: Self-pay | Admitting: Nurse Practitioner

## 2022-02-15 ENCOUNTER — Other Ambulatory Visit: Payer: Self-pay | Admitting: Nurse Practitioner

## 2022-02-15 DIAGNOSIS — F79 Unspecified intellectual disabilities: Secondary | ICD-10-CM

## 2022-02-15 NOTE — Telephone Encounter (Signed)
  Prescription Request  02/15/2022  What is the name of the medication or equipment? MEDROXYL '10MG'$   Have you contacted your pharmacy to request a refill? YES  Which pharmacy would you like this sent to? [facility], [place]  Mom called stating that pt has been taking this medicine for years. Unsure of why its not her med list but needs MMM to send in refills ASAP because pt only has 1 pill left.

## 2022-02-15 NOTE — Telephone Encounter (Signed)
Please review

## 2022-02-17 MED ORDER — MEDROXYPROGESTERONE ACETATE 10 MG PO TABS: 10.0000 mg | ORAL_TABLET | Freq: Every day | ORAL | 1 refills | Status: DC

## 2022-03-14 DIAGNOSIS — M1711 Unilateral primary osteoarthritis, right knee: Secondary | ICD-10-CM | POA: Diagnosis not present

## 2022-04-01 ENCOUNTER — Ambulatory Visit (INDEPENDENT_AMBULATORY_CARE_PROVIDER_SITE_OTHER): Payer: Medicare Other | Admitting: Podiatry

## 2022-04-01 DIAGNOSIS — M79674 Pain in right toe(s): Secondary | ICD-10-CM | POA: Diagnosis not present

## 2022-04-01 DIAGNOSIS — Q828 Other specified congenital malformations of skin: Secondary | ICD-10-CM | POA: Diagnosis not present

## 2022-04-01 DIAGNOSIS — M79675 Pain in left toe(s): Secondary | ICD-10-CM

## 2022-04-01 DIAGNOSIS — B351 Tinea unguium: Secondary | ICD-10-CM | POA: Diagnosis not present

## 2022-04-01 NOTE — Progress Notes (Signed)
Subjective: 42 year old female presents the office today for concerns of thick elongated toenails as well as calluses.  Calluses of and burning but does feel better after they get trimmed.  Her ankles been doing well.  Denies any new issues or injuries.  No fevers or chills.  Objective: NAD DP/PT pulses palpable bilaterally, CRT less than 3 seconds Nails are hypertrophic, dystrophic, brittle, discolored, elongated 10. No surrounding redness or drainage. Tenderness nails 1-5 bilaterally.  Hyperkeratotic lesions medial hallux without any underlying ulceration drainage or any signs of infection.  No significant callus formation submetatarsal 5 today. Interdigital macerations present.  No drainage or pus. No pain with calf compression, swelling, warmth, erythema  Assessment: Chronic right ankle pain, ankle sprain; symptomatic onychomycosis, hyperkeratotic lesion  Plan: -All treatment options discussed with the patient including all alternatives, risks, complications.  -Debrided nails x10 without any complications or bleeding -Debrided hyperkeratotic lesions x2 without any complications or bleeding.  Discussed moisturizer.  She can use other topicals such as Biofreeze as needed for discomfort. -Continue with supportive shoe gear to help protect ankle.  Trula Slade DPM

## 2022-04-07 ENCOUNTER — Other Ambulatory Visit: Payer: Self-pay | Admitting: Nurse Practitioner

## 2022-04-07 DIAGNOSIS — M25561 Pain in right knee: Secondary | ICD-10-CM

## 2022-05-28 ENCOUNTER — Ambulatory Visit (INDEPENDENT_AMBULATORY_CARE_PROVIDER_SITE_OTHER): Payer: Medicare Other | Admitting: Podiatry

## 2022-05-28 DIAGNOSIS — Q828 Other specified congenital malformations of skin: Secondary | ICD-10-CM

## 2022-05-28 DIAGNOSIS — B351 Tinea unguium: Secondary | ICD-10-CM

## 2022-05-28 DIAGNOSIS — M79674 Pain in right toe(s): Secondary | ICD-10-CM | POA: Diagnosis not present

## 2022-05-28 MED ORDER — CICLOPIROX 8 % EX SOLN: Freq: Every day | CUTANEOUS | 0 refills | Status: DC

## 2022-05-28 NOTE — Patient Instructions (Signed)
You can try UREA NAIL GEL on the toenails. You can also get UREA CREAM for the calluses

## 2022-06-03 NOTE — Progress Notes (Signed)
Subjective: 41 year old female presents the office today for concerns of thick elongated toenails as well as calluses.  The nails have gotten longer as well as the calluses in the are causing discomfort.  No open lesions.  She began some pain in the ankle but she has not been wearing the brace.  No recent injury or falls or changes otherwise.   Objective: NAD DP/PT pulses palpable bilaterally, CRT less than 3 seconds Nails are hypertrophic, dystrophic, brittle, discolored, elongated 10. No surrounding redness or drainage. Tenderness nails 1-5 bilaterally.  Hyperkeratotic lesions medial hallux without any underlying ulceration drainage or any signs of infection.  No significant callus formation submetatarsal 5 today. Interdigital macerations present.  No drainage or pus. No significant pain on the right ankle today.  No area pinpoint tenderness. No pain with calf compression, swelling, warmth, erythema  Assessment: Chronic right ankle pain, ankle sprain; symptomatic onychomycosis, hyperkeratotic lesion  Plan: -All treatment options discussed with the patient including all alternatives, risks, complications.  -Debrided nails x10 without any complications or bleeding. Also discussed urea nail gel.  -Debrided hyperkeratotic lesions x 2 without any complications or bleeding.  Discussed moisturizer.  She can use other topicals such as Biofreeze as needed for discomfort.  Ankle brace if needed. -Continue with supportive shoe gear to help protect ankle.  Trula Slade DPM

## 2022-06-27 DIAGNOSIS — M5414 Radiculopathy, thoracic region: Secondary | ICD-10-CM | POA: Diagnosis not present

## 2022-07-09 ENCOUNTER — Other Ambulatory Visit: Payer: Self-pay | Admitting: Nurse Practitioner

## 2022-07-09 DIAGNOSIS — M25561 Pain in right knee: Secondary | ICD-10-CM

## 2022-07-15 ENCOUNTER — Other Ambulatory Visit: Payer: Self-pay | Admitting: Podiatry

## 2022-07-15 ENCOUNTER — Other Ambulatory Visit: Payer: Self-pay | Admitting: Nurse Practitioner

## 2022-07-15 DIAGNOSIS — M5414 Radiculopathy, thoracic region: Secondary | ICD-10-CM | POA: Diagnosis not present

## 2022-07-15 DIAGNOSIS — M25561 Pain in right knee: Secondary | ICD-10-CM

## 2022-07-30 ENCOUNTER — Ambulatory Visit (INDEPENDENT_AMBULATORY_CARE_PROVIDER_SITE_OTHER): Payer: Medicare Other | Admitting: Podiatry

## 2022-07-30 ENCOUNTER — Ambulatory Visit (INDEPENDENT_AMBULATORY_CARE_PROVIDER_SITE_OTHER): Payer: Medicare Other

## 2022-07-30 DIAGNOSIS — M79672 Pain in left foot: Secondary | ICD-10-CM

## 2022-07-30 DIAGNOSIS — M2041 Other hammer toe(s) (acquired), right foot: Secondary | ICD-10-CM

## 2022-07-30 DIAGNOSIS — M79674 Pain in right toe(s): Secondary | ICD-10-CM | POA: Diagnosis not present

## 2022-07-30 DIAGNOSIS — T148XXA Other injury of unspecified body region, initial encounter: Secondary | ICD-10-CM

## 2022-07-30 DIAGNOSIS — M2042 Other hammer toe(s) (acquired), left foot: Secondary | ICD-10-CM

## 2022-07-30 DIAGNOSIS — B351 Tinea unguium: Secondary | ICD-10-CM

## 2022-07-30 NOTE — Patient Instructions (Signed)
For the calluses use 40% urea

## 2022-08-05 NOTE — Progress Notes (Signed)
Subjective: 41 year old female presents the office today for concerns of thick elongated toenails as well as calluses.  Ankle pain seems to be stable and she has not been complaining about this.  She been having some left foot pain her toes been turning.  Objective: NAD DP/PT pulses palpable bilaterally, CRT less than 3 seconds Nails are hypertrophic, dystrophic, brittle, discolored, elongated 10. No surrounding redness or drainage. Tenderness nails 1-5 bilaterally.  Hyperkeratotic lesions medial hallux without any underlying ulceration drainage or any signs of infection.   No significant pain on the right ankle today.  No area pinpoint tenderness. Hammertoes present in the left side.  No area pinpoint tenderness noted today. No pain with calf compression, swelling, warmth, erythema  Assessment: Chronic right ankle pain, ankle sprain; symptomatic onychomycosis, hyperkeratotic lesion  Plan: -All treatment options discussed with the patient including all alternatives, risks, complications.  -X-rays were obtained reviewed of the left foot.  Digital form is noted.  No evidence of acute fracture. -Debrided nails x10 without any complications or bleeding. Also discussed urea nail gel.  -Debrided hyperkeratotic lesions x 2 without any complications or bleeding.  Discussed moisturizer.   -Continue supportive shoe gear.  Discussed different exercises to help with the toes as well.  Trula Slade DPM

## 2022-08-06 ENCOUNTER — Other Ambulatory Visit: Payer: Self-pay | Admitting: Nurse Practitioner

## 2022-08-06 DIAGNOSIS — Z1231 Encounter for screening mammogram for malignant neoplasm of breast: Secondary | ICD-10-CM

## 2022-08-09 ENCOUNTER — Other Ambulatory Visit: Payer: Self-pay | Admitting: Nurse Practitioner

## 2022-08-12 ENCOUNTER — Other Ambulatory Visit: Payer: Self-pay

## 2022-08-12 ENCOUNTER — Inpatient Hospital Stay: Admission: RE | Admit: 2022-08-12 | Payer: Medicaid Other | Source: Ambulatory Visit

## 2022-08-12 ENCOUNTER — Other Ambulatory Visit: Payer: Self-pay | Admitting: Podiatry

## 2022-08-12 DIAGNOSIS — M79674 Pain in right toe(s): Secondary | ICD-10-CM

## 2022-08-12 DIAGNOSIS — M79672 Pain in left foot: Secondary | ICD-10-CM

## 2022-08-12 DIAGNOSIS — M2041 Other hammer toe(s) (acquired), right foot: Secondary | ICD-10-CM

## 2022-08-26 ENCOUNTER — Ambulatory Visit (INDEPENDENT_AMBULATORY_CARE_PROVIDER_SITE_OTHER): Payer: Medicare Other | Admitting: Nurse Practitioner

## 2022-08-26 ENCOUNTER — Encounter: Payer: Self-pay | Admitting: Nurse Practitioner

## 2022-08-26 VITALS — BP 131/89 | HR 71 | Temp 97.9°F | Resp 20 | Ht 63.0 in | Wt 238.0 lb

## 2022-08-26 DIAGNOSIS — Z6841 Body Mass Index (BMI) 40.0 and over, adult: Secondary | ICD-10-CM | POA: Diagnosis not present

## 2022-08-26 DIAGNOSIS — E7800 Pure hypercholesterolemia, unspecified: Secondary | ICD-10-CM | POA: Diagnosis not present

## 2022-08-26 DIAGNOSIS — F79 Unspecified intellectual disabilities: Secondary | ICD-10-CM | POA: Diagnosis not present

## 2022-08-26 MED ORDER — MEDROXYPROGESTERONE ACETATE 10 MG PO TABS: 10.0000 mg | ORAL_TABLET | Freq: Every day | ORAL | 1 refills | Status: DC

## 2022-08-26 NOTE — Patient Instructions (Signed)

## 2022-08-26 NOTE — Progress Notes (Signed)
Subjective:    Patient ID: Ashlee Hoffman, female    DOB: 02/07/1981, 41 y.o.   MRN: [id number]   Chief Complaint: Medical Management of Chronic Issues    HPI:  Ashlee Hoffman is a 41 y.o. who identifies as a female who was assigned female at birth.   Social history: Lives with: parents Work history: disabled   Comes in today for follow up of the following chronic medical issues:  1. Mentally disabled She lives with her mom and step dad; seldomly left home alone. She is able to perform ADLs for self care independently. Very pleasant and interactive throughout visit.  2. Pure hypercholesterolemia On no medications. Does not particularly watch her diet. Lab Results  Component Value Date   CHOL 166 06/19/2021   HDL 33 (L) 06/19/2021   LDLCALC 91 06/19/2021   TRIG 252 (H) 06/19/2021   CHOLHDL 5.0 (H) 06/19/2021     3. Body mass index (BMI) 40.0-44.9, adult (HCC) Weight is up 6lbs since last check. Wt Readings from Last 3 Encounters:  08/26/22 238 lb (108 kg)  09/18/21 232 lb 6.4 oz (105.4 kg)  07/16/21 229 lb (103.9 kg)   BMI Readings from Last 3 Encounters:  08/26/22 42.16 kg/m  09/18/21 41.17 kg/m  07/16/21 40.57 kg/m      New complaints: None today  Allergies  Allergen Reactions   Septra [Sulfamethoxazole-Trimethoprim] Itching   Sulfamethoxazole    Trimethoprim    Outpatient Encounter Medications as of 08/26/2022  Medication Sig   medroxyPROGESTERone (PROVERA) 10 MG tablet Take 1 tablet (10 mg total) by mouth daily.   omeprazole (PRILOSEC) 40 MG capsule TAKE 1 CAPSULE (40 MG TOTAL) BY MOUTH DAILY. (NEEDS TO BE SEEN BEFORE NEXT REFILL)   Acetaminophen (TYLENOL ARTHRITIS EXT RELIEF PO) Take 1-2 tablets by mouth as needed. Reported on 01/12/2016 (Patient not taking: Reported on 08/26/2022)   ciclopirox (PENLAC) 8 % solution APPLY TOPICALLY AT BEDTIME. APPLY OVER NAIL AND SURROUNDING SKIN. APPLY DAILY OVER PREVIOUS COAT. AFTER SEVEN (7) DAYS, MAY REMOVE WITH  ALCOHOL AND CONTINUE CYCLE. (Patient not taking: Reported on 08/26/2022)   ketoconazole (NIZORAL) 2 % cream Apply 1 application topically daily. (Patient not taking: Reported on 08/26/2022)   No facility-administered encounter medications on file as of 08/26/2022.    Past Surgical History:  Procedure Laterality Date   LUMBAR LAMINECTOMY/DECOMPRESSION MICRODISCECTOMY N/A 05/24/2013   Procedure: LUMBAR LAMINECTOMY/DECOMPRESSION MICRODISCECTOMY 1 LEVEL LEFT LUMBAR FIVE-SACARAL-ONE;  Surgeon: Charlie Pitter, MD;  Location: [facility];  Service: Neurosurgery;  Laterality: N/A;  left    Family History  Problem Relation Age of Onset   Hypertension Mother    Arthritis Mother    Diabetes Maternal Grandfather    Hypertension Maternal Grandfather    Heart disease Maternal Grandfather       Controlled substance contract: n/a     Review of Systems  Constitutional:  Negative for activity change and fever.  HENT:  Negative for congestion.   Eyes:  Negative for visual disturbance.  Respiratory:  Negative for chest tightness and shortness of breath.   Cardiovascular:  Negative for chest pain and leg swelling.  Gastrointestinal:  Negative for abdominal pain.  Endocrine: Negative for polydipsia and polyuria.  Genitourinary:  Negative for difficulty urinating.  Neurological:  Negative for weakness and headaches.  All other systems reviewed and are negative.      Objective:   Physical Exam Vitals and nursing note reviewed.  Constitutional:      General: She is  not in acute distress.    Appearance: Normal appearance. She is well-groomed.  HENT:     Head: Normocephalic and atraumatic.     Right Ear: Tympanic membrane normal.     Left Ear: Tympanic membrane normal.     Nose: Nose normal.     Mouth/Throat:     Mouth: Mucous membranes are moist.     Pharynx: Oropharynx is clear.  Eyes:     Conjunctiva/sclera: Conjunctivae normal.     Pupils: Pupils are equal, round, and reactive to  light.  Cardiovascular:     Rate and Rhythm: Normal rate and regular rhythm.     Pulses: Normal pulses.     Heart sounds: Normal heart sounds.  Pulmonary:     Effort: Pulmonary effort is normal. No respiratory distress.     Breath sounds: Normal breath sounds. No wheezing, rhonchi or rales.  Abdominal:     General: Bowel sounds are normal.     Palpations: Abdomen is soft.     Tenderness: There is no abdominal tenderness.  Musculoskeletal:        General: Normal range of motion.     Right lower leg: No edema.     Left lower leg: No edema.  Lymphadenopathy:     Cervical: No cervical adenopathy.  Skin:    General: Skin is warm and dry.     Capillary Refill: Capillary refill takes less than 2 seconds.  Neurological:     General: No focal deficit present.     Mental Status: She is alert and oriented to person, place, and time.  Psychiatric:        Mood and Affect: Mood normal.        Behavior: Behavior normal. Behavior is cooperative.       BP 131/89   Pulse 71   Temp 97.9 F (36.6 C) (Temporal)   Resp 20   Ht _0  (1.6 m)   Wt 238 lb (108 kg)   BMI 42.16 kg/m      Assessment & Plan:  Ashlee Hoffman comes in today with chief complaint of Medical Management of Chronic Issues   Diagnosis and orders addressed:  1. Mentally disabled Lives with her parents  2. Pure hypercholesterolemia Low fat diet - CBC with Differential/Platelet - CMP14+EGFR - Lipid panel  3. Body mass index (BMI) 40.0-44.9, adult (HCC) Discussed diet and exercise for person with BMI >25 Will recheck weight in 3-6 months    Labs pending Health Maintenance reviewed Diet and exercise encouraged  Follow up plan: 6 months and prn   Mary-Margaret Hassell Done, FNP

## 2022-08-27 LAB — CMP14+EGFR
ALT: 26 IU/L (ref 0–32)
AST: 18 IU/L (ref 0–40)
Albumin/Globulin Ratio: 1.9 (ref 1.2–2.2)
Albumin: 4.2 g/dL (ref 3.9–4.9)
Alkaline Phosphatase: 47 IU/L (ref 44–121)
BUN/Creatinine Ratio: 15 (ref 9–23)
BUN: 12 mg/dL (ref 6–24)
Bilirubin Total: 0.4 mg/dL (ref 0.0–1.2)
CO2: 20 mmol/L (ref 20–29)
Calcium: 9.3 mg/dL (ref 8.7–10.2)
Chloride: 104 mmol/L (ref 96–106)
Creatinine, Ser: 0.82 mg/dL (ref 0.57–1.00)
Globulin, Total: 2.2 g/dL (ref 1.5–4.5)
Glucose: 75 mg/dL (ref 70–99)
Potassium: 4.4 mmol/L (ref 3.5–5.2)
Sodium: 139 mmol/L (ref 134–144)
Total Protein: 6.4 g/dL (ref 6.0–8.5)
eGFR: 93 mL/min/{1.73_m2} (ref >59)

## 2022-08-27 LAB — CBC WITH DIFFERENTIAL/PLATELET
Basophils Absolute: 0 10*3/uL (ref 0.0–0.2)
Basos: 1 %
EOS (ABSOLUTE): 0.1 10*3/uL (ref 0.0–0.4)
Eos: 1 %
Hematocrit: 40.7 % (ref 34.0–46.6)
Hemoglobin: 13.4 g/dL (ref 11.1–15.9)
Immature Grans (Abs): 0 10*3/uL (ref 0.0–0.1)
Immature Granulocytes: 0 %
Lymphocytes Absolute: 2.1 10*3/uL (ref 0.7–3.1)
Lymphs: 27 %
MCH: 29.5 pg (ref 26.6–33.0)
MCHC: 32.9 g/dL (ref 31.5–35.7)
MCV: 90 fL (ref 79–97)
Monocytes Absolute: 0.5 10*3/uL (ref 0.1–0.9)
Monocytes: 7 %
Neutrophils Absolute: 5.1 10*3/uL (ref 1.4–7.0)
Neutrophils: 64 %
Platelets: 281 10*3/uL (ref 150–450)
RBC: 4.54 x10E6/uL (ref 3.77–5.28)
RDW: 12.5 % (ref 11.7–15.4)
WBC: 7.8 10*3/uL (ref 3.4–10.8)

## 2022-08-27 LAB — LIPID PANEL
Chol/HDL Ratio: 4.6 ratio — ABNORMAL HIGH (ref 0.0–4.4)
Cholesterol, Total: 183 mg/dL (ref 100–199)
HDL: 40 mg/dL (ref >39)
LDL Chol Calc (NIH): 114 mg/dL — ABNORMAL HIGH (ref 0–99)
Triglycerides: 161 mg/dL — ABNORMAL HIGH (ref 0–149)
VLDL Cholesterol Cal: 29 mg/dL (ref 5–40)

## 2022-09-23 ENCOUNTER — Ambulatory Visit
Admission: RE | Admit: 2022-09-23 | Discharge: 2022-09-23 | Disposition: A | Discharge status: Home / Self Care | Payer: Medicaid Other | Source: Ambulatory Visit | Attending: Nurse Practitioner | Admitting: Nurse Practitioner

## 2022-09-23 DIAGNOSIS — Z1231 Encounter for screening mammogram for malignant neoplasm of breast: Secondary | ICD-10-CM | POA: Diagnosis not present

## 2022-09-25 DIAGNOSIS — M5414 Radiculopathy, thoracic region: Secondary | ICD-10-CM | POA: Diagnosis not present

## 2022-10-01 ENCOUNTER — Ambulatory Visit (INDEPENDENT_AMBULATORY_CARE_PROVIDER_SITE_OTHER): Payer: 59 | Admitting: Podiatry

## 2022-10-01 DIAGNOSIS — B351 Tinea unguium: Secondary | ICD-10-CM | POA: Diagnosis not present

## 2022-10-01 DIAGNOSIS — Q828 Other specified congenital malformations of skin: Secondary | ICD-10-CM

## 2022-10-01 DIAGNOSIS — M79674 Pain in right toe(s): Secondary | ICD-10-CM | POA: Diagnosis not present

## 2022-10-01 NOTE — Progress Notes (Signed)
Subjective: Chief Complaint  Patient presents with   foot care    [place]   42 year old female presents with her parents for concerns of thick elongated tunafish not able to trim her self and she also has calluses that cause discomfort.  Otherwise she has been doing well no other pain.  She got new shoes which help control her ankle.    Objective: AAO x3, NAD DP/PT pulses palpable bilaterally, CRT less than 3 seconds Nails to be hypertrophic, dystrophic yellow discoloration.  There is some clearing on the proximal nail fold.  No edema, erythema or signs of infection.  Hyperkeratotic lesions medial hallux without any underlying ulceration drainage or signs of infection.  No open lesions. No significant pain to the ankle and there is no other areas of tenderness noted today. No pain with calf compression, swelling, warmth, erythema  Assessment: Symptomatic onychomycosis, hyperkeratotic lesions  Plan: -All treatment options discussed with the patient including all alternatives, risks, complications.  -Sharply debrided nails x 10 without any complications or bleeding.  Continue topical medication -Sharp debrided hyperkeratotic lesions x 2 without any complications or bleeding.  Moisturizer, offloading. -Patient encouraged to call the office with any questions, concerns, change in symptoms.   Trula Slade DPM

## 2022-10-06 NOTE — Patient Instructions (Signed)
Ashlee Hoffman , Thank you for taking time to come for your Medicare Wellness Visit. I appreciate your ongoing commitment to your health goals. Please review the following plan we discussed and let me know if I can assist you in the future.   These are the goals we discussed:  Goals      Exercise 3x per week (30 min per time)        This is a list of the screening recommended for you and due dates:  Health Maintenance  Topic Date Due   COVID-19 Vaccine (1) Never done   HIV Screening  Never done   Hepatitis C Screening: USPSTF Recommendation to screen - Ages 32-79 yo.  Never done   Pap Smear  Never done   Medicare Annual Wellness Visit  07/16/2022   Flu Shot  12/15/2022*   DTaP/Tdap/Td vaccine (2 - Td or Tdap) 12/19/2024   HPV Vaccine  Aged Out  *Topic was postponed. The date shown is not the original due date.    Advanced directives: Forms are available if you choose in the future to pursue completion.  This is recommended in order to make sure that your health wishes are honored in the event that you are unable to verbalize them to the provider.    Conditions/risks identified: Aim for 30 minutes of exercise or brisk walking, 6-8 glasses of water, and 5 servings of fruits and vegetables each day.   Next appointment: Follow up in one year for your annual wellness visit.   Preventive Care 40-64 Years, Female Preventive care refers to lifestyle choices and visits with your health care provider that can promote health and wellness. What does preventive care include? A yearly physical exam. This is also called an annual well check. Dental exams once or twice a year. Routine eye exams. Ask your health care provider how often you should have your eyes checked. Personal lifestyle choices, including: Daily care of your teeth and gums. Regular physical activity. Eating a healthy diet. Avoiding tobacco and drug use. Limiting alcohol use. Practicing safe sex. Taking low-dose aspirin daily  starting at age 67. Taking vitamin and mineral supplements as recommended by your health care provider. What happens during an annual well check? The services and screenings done by your health care provider during your annual well check will depend on your age, overall health, lifestyle risk factors, and family history of disease. Counseling  Your health care provider may ask you questions about your: Alcohol use. Tobacco use. Drug use. Emotional well-being. Home and relationship well-being. Sexual activity. Eating habits. Work and work Statistician. Method of birth control. Menstrual cycle. Pregnancy history. Screening  You may have the following tests or measurements: Height, weight, and BMI. Blood pressure. Lipid and cholesterol levels. These may be checked every 5 years, or more frequently if you are over 46 years old. Skin check. Lung cancer screening. You may have this screening every year starting at age 27 if you have a 30-pack-year history of smoking and currently smoke or have quit within the past 15 years. Fecal occult blood test (FOBT) of the stool. You may have this test every year starting at age 73. Flexible sigmoidoscopy or colonoscopy. You may have a sigmoidoscopy every 5 years or a colonoscopy every 10 years starting at age 49. Hepatitis C blood test. Hepatitis B blood test. Sexually transmitted disease (STD) testing. Diabetes screening. This is done by checking your blood sugar (glucose) after you have not eaten for a while (fasting). You  may have this done every 1-3 years. Mammogram. This may be done every 1-2 years. Talk to your health care provider about when you should start having regular mammograms. This may depend on whether you have a family history of breast cancer. BRCA-related cancer screening. This may be done if you have a family history of breast, ovarian, tubal, or peritoneal cancers. Pelvic exam and Pap test. This may be done every 3 years starting  at age 34. Starting at age 75, this may be done every 5 years if you have a Pap test in combination with an HPV test. Bone density scan. This is done to screen for osteoporosis. You may have this scan if you are at high risk for osteoporosis. Discuss your test results, treatment options, and if necessary, the need for more tests with your health care provider. Vaccines  Your health care provider may recommend certain vaccines, such as: Influenza vaccine. This is recommended every year. Tetanus, diphtheria, and acellular pertussis (Tdap, Td) vaccine. You may need a Td booster every 10 years. Zoster vaccine. You may need this after age 23. Pneumococcal 13-valent conjugate (PCV13) vaccine. You may need this if you have certain conditions and were not previously vaccinated. Pneumococcal polysaccharide (PPSV23) vaccine. You may need one or two doses if you smoke cigarettes or if you have certain conditions. Talk to your health care provider about which screenings and vaccines you need and how often you need them. This information is not intended to replace advice given to you by your health care provider. Make sure you discuss any questions you have with your health care provider. Document Released: 09/29/2015 Document Revised: 05/22/2016 Document Reviewed: 07/04/2015 Elsevier Interactive Patient Education  2017 [employer] Prevention in the Home Falls can cause injuries. They can happen to people of all ages. There are many things you can do to make your home safe and to help prevent falls. What can I do on the outside of my home? Regularly fix the edges of walkways and driveways and fix any cracks. Remove anything that might make you trip as you walk through a door, such as a raised step or threshold. Trim any bushes or trees on the path to your home. Use bright outdoor lighting. Clear any walking paths of anything that might make someone trip, such as rocks or tools. Regularly check  to see if handrails are loose or broken. Make sure that both sides of any steps have handrails. Any raised decks and porches should have guardrails on the edges. Have any leaves, snow, or ice cleared regularly. Use sand or salt on walking paths during winter. Clean up any spills in your garage right away. This includes oil or grease spills. What can I do in the bathroom? Use night lights. Install grab bars by the toilet and in the tub and shower. Do not use towel bars as grab bars. Use non-skid mats or decals in the tub or shower. If you need to sit down in the shower, use a plastic, non-slip stool. Keep the floor dry. Clean up any water that spills on the floor as soon as it happens. Remove soap buildup in the tub or shower regularly. Attach bath mats securely with double-sided non-slip rug tape. Do not have throw rugs and other things on the floor that can make you trip. What can I do in the bedroom? Use night lights. Make sure that you have a light by your bed that is easy to reach. Do  not use any sheets or blankets that are too big for your bed. They should not hang down onto the floor. Have a firm chair that has side arms. You can use this for support while you get dressed. Do not have throw rugs and other things on the floor that can make you trip. What can I do in the kitchen? Clean up any spills right away. Avoid walking on wet floors. Keep items that you use a lot in easy-to-reach places. If you need to reach something above you, use a strong step stool that has a grab bar. Keep electrical cords out of the way. Do not use floor polish or wax that makes floors slippery. If you must use wax, use non-skid floor wax. Do not have throw rugs and other things on the floor that can make you trip. What can I do with my stairs? Do not leave any items on the stairs. Make sure that there are handrails on both sides of the stairs and use them. Fix handrails that are broken or loose. Make  sure that handrails are as long as the stairways. Check any carpeting to make sure that it is firmly attached to the stairs. Fix any carpet that is loose or worn. Avoid having throw rugs at the top or bottom of the stairs. If you do have throw rugs, attach them to the floor with carpet tape. Make sure that you have a light switch at the top of the stairs and the bottom of the stairs. If you do not have them, ask someone to add them for you. What else can I do to help prevent falls? Wear shoes that: Do not have high heels. Have rubber bottoms. Are comfortable and fit you well. Are closed at the toe. Do not wear sandals. If you use a stepladder: Make sure that it is fully opened. Do not climb a closed stepladder. Make sure that both sides of the stepladder are locked into place. Ask someone to hold it for you, if possible. Clearly mark and make sure that you can see: Any grab bars or handrails. First and last steps. Where the edge of each step is. Use tools that help you move around (mobility aids) if they are needed. These include: Canes. Walkers. Scooters. Crutches. Turn on the lights when you go into a dark area. Replace any light bulbs as soon as they burn out. Set up your furniture so you have a clear path. Avoid moving your furniture around. If any of your floors are uneven, fix them. If there are any pets around you, be aware of where they are. Review your medicines with your doctor. Some medicines can make you feel dizzy. This can increase your chance of falling. Ask your doctor what other things that you can do to help prevent falls. This information is not intended to replace advice given to you by your health care provider. Make sure you discuss any questions you have with your health care provider. Document Released: 06/29/2009 Document Revised: 02/08/2016 Document Reviewed: 10/07/2014 Elsevier Interactive Patient Education  2017 [employer].

## 2022-10-06 NOTE — Progress Notes (Signed)
Subjective:   Ashlee Hoffman is a 42 y.o. female who presents for Medicare Annual (Subsequent) preventive examination.  I connected with  Ashlee Hoffman on 10/07/22 by a audio enabled telemedicine application and verified that I am speaking with the correct person using two identifiers.  Patient Location: Home  Provider Location: Home Office  I discussed the limitations of evaluation and management by telemedicine. The patient expressed understanding and agreed to proceed.  Review of Systems     Cardiac Risk Factors include: sedentary lifestyle;obesity (BMI >30kg/m2)     Objective:    Today's Vitals   10/07/22 1203  Weight: 238 lb (108 kg)  Height: '5\' 3"'$  (1.6 m)   Body mass index is 42.16 kg/m.     10/07/2022    1:41 PM 07/16/2021    1:20 PM 12/19/2020    6:15 PM 07/05/2020    7:02 PM 06/10/2016   11:55 AM 12/20/2014    4:42 PM 12/20/2014    4:32 PM  Advanced Directives  Does Patient Have a Medical Advance Directive? No No No No Yes Yes No  Type of Advance Directive     Healthcare Power of [place]   Does patient want to make changes to medical advance directive?     No - Patient declined    Copy of [place] in Chart?     No - copy requested No - copy requested   Would patient like information on creating a medical advance directive? No - Patient declined No - Patient declined         Current Medications (verified) Outpatient Encounter Medications as of 10/07/2022  Medication Sig   Acetaminophen (TYLENOL ARTHRITIS EXT RELIEF PO) Take 1-2 tablets by mouth as needed. Reported on 01/12/2016   ciclopirox (PENLAC) 8 % solution APPLY TOPICALLY AT BEDTIME. APPLY OVER NAIL AND SURROUNDING SKIN. APPLY DAILY OVER PREVIOUS COAT. AFTER SEVEN (7) DAYS, MAY REMOVE WITH ALCOHOL AND CONTINUE CYCLE.   ketoconazole (NIZORAL) 2 % cream Apply 1 application topically daily.   medroxyPROGESTERone (PROVERA) 10 MG tablet Take 1 tablet (10 mg total) by  mouth daily.   omeprazole (PRILOSEC) 40 MG capsule TAKE 1 CAPSULE (40 MG TOTAL) BY MOUTH DAILY. (NEEDS TO BE SEEN BEFORE NEXT REFILL)   No facility-administered encounter medications on file as of 10/07/2022.    Allergies (verified) Septra [sulfamethoxazole-trimethoprim], Sulfamethoxazole, and Trimethoprim   History: Past Medical History:  Diagnosis Date   Bulging lumbar disc 05/2013   Mental disability    Pure hypercholesterolemia 06/19/2021   Shortness of breath    with exertion   Past Surgical History:  Procedure Laterality Date   LUMBAR LAMINECTOMY/DECOMPRESSION MICRODISCECTOMY N/A 05/24/2013   Procedure: LUMBAR LAMINECTOMY/DECOMPRESSION MICRODISCECTOMY 1 LEVEL LEFT LUMBAR FIVE-SACARAL-ONE;  Surgeon: Charlie Pitter, MD;  Location: [facility];  Service: Neurosurgery;  Laterality: N/A;  left   Family History  Problem Relation Age of Onset   Hypertension Mother    Arthritis Mother    Diabetes Maternal Grandfather    Hypertension Maternal Grandfather    Heart disease Maternal Grandfather    Breast cancer Neg Hx    Social History   Socioeconomic History   Marital status: Single    Spouse name: Not on file   Number of children: Not on file   Years of education: Not on file   Highest education level: Not on file  Occupational History   Not on file  Tobacco Use   Smoking status: Never  Smokeless tobacco: Never  Substance and Sexual Activity   Alcohol use: No   Drug use: No   Sexual activity: Never  Other Topics Concern   Not on file  Social History Narrative   Not on file   Social Determinants of Health   Financial Resource Strain: Low Risk  (10/07/2022)   Overall Financial Resource Strain (CARDIA)    Difficulty of Paying Living Expenses: Not hard at all  Food Insecurity: No Food Insecurity (10/07/2022)   Hunger Vital Sign    Worried About Running Out of Food in the Last Year: Never true    Ran Out of Food in the Last Year: Never true  Transportation Needs: No  Transportation Needs (10/07/2022)   PRAPARE - Hydrologist (Medical): No    Lack of Transportation (Non-Medical): No  Physical Activity: Insufficiently Active (10/07/2022)   Exercise Vital Sign    Days of Exercise per Week: 3 days    Minutes of Exercise per Session: 30 min  Stress: No Stress Concern Present (10/07/2022)   [place]    Feeling of Stress : Not at all  Social Connections: Moderately Integrated (10/07/2022)   Social Connection and Isolation Panel [NHANES]    Frequency of Communication with Friends and Family: More than three times a week    Frequency of Social Gatherings with Friends and Family: Three times a week    Attends Religious Services: 1 to 4 times per year    Active Member of Clubs or Organizations: No    Attends Music therapist: 1 to 4 times per year    Marital Status: Never married    Tobacco Counseling Counseling given: Not Answered   Clinical Intake:  Pre-visit preparation completed: Yes  Pain : No/denies pain     Diabetes: No  How often do you need to have someone help you when you read instructions, pamphlets, or other written materials from your doctor or pharmacy?: 4 - Often  Diabetic?No   Interpreter Needed?: No  Comments: Assisted with visit by mother Lauro Franklin Information entered by :: Denman George LPN   Activities of Daily Living    10/07/2022    1:41 PM  In your present state of health, do you have any difficulty performing the following activities:  Hearing? 0  Vision? 0  Difficulty concentrating or making decisions? 0  Walking or climbing stairs? 0  Dressing or bathing? 0  Doing errands, shopping? 0  Preparing Food and eating ? N  Using the Toilet? N  In the past six months, have you accidently leaked urine? N  Do you have problems with loss of bowel control? N  Managing your Medications? N  Managing your  Finances? N  Housekeeping or managing your Housekeeping? N    Patient Care Team: Chevis Pretty, FNP as PCP - General (Nurse Practitioner) Earnie Larsson, MD as Consulting Physician (Neurosurgery) Trula Lalitha Ilyas, DPM as Consulting Physician (Podiatry)  Indicate any recent Medical Services you may have received from other than [facility] providers in the past year (date may be approximate).     Assessment:   This is a routine wellness examination for [place].  Hearing/Vision screen Hearing Screening - Comments:: Denies hearing difficulties  Vision Screening - Comments:: Denies vision problems   Dietary issues and exercise activities discussed: Current Exercise Habits: Home exercise routine, Type of exercise: walking, Time (Minutes): 30, Frequency (Times/Week): 5, Weekly Exercise (Minutes/Week): 150, Intensity: Mild  Goals Addressed   None    Depression Screen    10/07/2022    1:41 PM 08/26/2022    9:50 AM 07/16/2021    1:17 PM 06/19/2021    3:17 PM 11/20/2020   10:07 AM 01/05/2020    9:03 AM 05/17/2019    2:11 PM  PHQ 2/9 Scores  PHQ - 2 Score 0 0 0 0 0 0 0  PHQ- 9 Score  0 0 0       Fall Risk    10/07/2022    1:40 PM 08/26/2022    9:50 AM 07/16/2021    1:21 PM 06/19/2021    3:17 PM 11/20/2020   10:07 AM  [place] in the past year? 1 1 0 0 0  Number falls in past yr: 0 0 0    Injury with Fall? 0 0 0    Risk for fall due to : History of fall(s) History of fall(s) Impaired balance/gait    Follow up Falls evaluation completed;Education provided;Falls prevention discussed Education provided Falls prevention discussed      FALL RISK PREVENTION PERTAINING TO THE HOME:  Any stairs in or around the home? No  If so, are there any without handrails? No  Home free of loose throw rugs in walkways, pet beds, electrical cords, etc? Yes  Adequate lighting in your home to reduce risk of falls? Yes   ASSISTIVE DEVICES UTILIZED TO PREVENT FALLS:  Life alert? No  Use of a  cane, walker or w/c? No  Grab bars in the bathroom? No  Shower chair or bench in shower? No  Elevated toilet seat or a handicapped toilet? Yes   TIMED UP AND GO:  Was the test performed? No . Telephonic visit   Cognitive Function:    06/10/2016    3:57 PM 12/20/2014    4:20 PM 12/20/2014    2:38 PM  MMSE - Mini Mental State Exam  Not completed: Unable to complete  Refused  Orientation to time  4   Orientation to Place  3   Registration  3   Attention/ Calculation  0   Recall  1   Language- name 2 objects  2   Language- repeat  1   Language- follow 3 step command  1   Language- read & follow direction  1   Write a sentence  1   Copy design  0   Total score  17         10/07/2022    1:42 PM  6CIT Screen  What Year? 0 points  What month? 0 points  What time? 0 points  Count back from 20 0 points  Months in reverse 0 points  Repeat phrase 0 points  Total Score 0 points    Immunizations Immunization History  Administered Date(s) Administered   Tdap 12/20/2014    TDAP status: Up to date  Flu Vaccine status: Declined, Education has been provided regarding the importance of this vaccine but patient still declined. Advised may receive this vaccine at local pharmacy or Health Dept. Aware to provide a copy of the vaccination record if obtained from local pharmacy or Health Dept. Verbalized acceptance and understanding.  Covid-19 vaccine status: Information provided on how to obtain vaccines.   Qualifies for Shingles Vaccine? No    Screening Tests Health Maintenance  Topic Date Due   COVID-19 Vaccine (1) Never done   HIV Screening  Never done   Hepatitis C Screening  Never  done   PAP SMEAR-Modifier  Never done   INFLUENZA VACCINE  12/15/2022 (Originally 04/16/2022)   Medicare Annual Wellness (AWV)  10/08/2023   DTaP/Tdap/Td (2 - Td or Tdap) 12/19/2024   HPV VACCINES  Aged Out    Health Maintenance  Health Maintenance Due  Topic Date Due   COVID-19 Vaccine (1)  Never done   HIV Screening  Never done   Hepatitis C Screening  Never done   PAP SMEAR-Modifier  Never done    Lung Cancer Screening: (Low Dose CT Chest recommended if Age 75-80 years, 30 pack-year currently smoking OR have quit w/in 15years.) does not qualify.   Lung Cancer Screening Referral: n/a   Additional Screening:  Hepatitis C Screening: does qualify; Completed at next office visit   Vision Screening: Recommended annual ophthalmology exams for early detection of glaucoma and other disorders of the eye. Is the patient up to date with their annual eye exam?  No  Who is the provider or what is the name of the office in which the patient attends annual eye exams? None  If pt is not established with a provider, would they like to be referred to a provider to establish care? No .   Dental Screening: Recommended annual dental exams for proper oral hygiene  Community Resource Referral / Chronic Care Management: CRR required this visit?  No   CCM required this visit?  No      Plan:     I have personally reviewed and noted the following in the patient's chart:   Medical and social history Use of alcohol, tobacco or illicit drugs  Current medications and supplements including opioid prescriptions. Patient is not currently taking opioid prescriptions. Functional ability and status Nutritional status Physical activity Advanced directives List of other physicians Hospitalizations, surgeries, and ER visits in previous 12 months Vitals Screenings to include cognitive, depression, and falls Referrals and appointments  In addition, I have reviewed and discussed with patient certain preventive protocols, quality metrics, and best practice recommendations. A written personalized care plan for preventive services as well as general preventive health recommendations were provided to patient.     Vanetta Mulders, [place]   6/60/6004   Due to this being a virtual visit, the  after visit summary with patients personalized plan was offered to patient via mail or my-chart.  Patient would like to access on my-chart  Nurse Notes: No concerns

## 2022-10-07 ENCOUNTER — Ambulatory Visit (INDEPENDENT_AMBULATORY_CARE_PROVIDER_SITE_OTHER): Payer: 59

## 2022-10-07 VITALS — Ht 63.0 in | Wt 238.0 lb

## 2022-10-07 DIAGNOSIS — Z0000 Encounter for general adult medical examination without abnormal findings: Secondary | ICD-10-CM

## 2022-11-28 ENCOUNTER — Ambulatory Visit (INDEPENDENT_AMBULATORY_CARE_PROVIDER_SITE_OTHER): Payer: 59 | Admitting: Nurse Practitioner

## 2022-11-28 ENCOUNTER — Encounter: Payer: Self-pay | Admitting: Nurse Practitioner

## 2022-11-28 ENCOUNTER — Ambulatory Visit (INDEPENDENT_AMBULATORY_CARE_PROVIDER_SITE_OTHER): Payer: 59

## 2022-11-28 VITALS — BP 121/79 | HR 82 | Temp 97.9°F | Resp 20 | Ht 63.0 in | Wt 234.0 lb

## 2022-11-28 DIAGNOSIS — E7800 Pure hypercholesterolemia, unspecified: Secondary | ICD-10-CM | POA: Diagnosis not present

## 2022-11-28 DIAGNOSIS — M79601 Pain in right arm: Secondary | ICD-10-CM | POA: Diagnosis not present

## 2022-11-28 DIAGNOSIS — J301 Allergic rhinitis due to pollen: Secondary | ICD-10-CM

## 2022-11-28 DIAGNOSIS — F79 Unspecified intellectual disabilities: Secondary | ICD-10-CM

## 2022-11-28 DIAGNOSIS — Z6841 Body Mass Index (BMI) 40.0 and over, adult: Secondary | ICD-10-CM

## 2022-11-28 MED ORDER — CETIRIZINE HCL 10 MG PO TABS: 10.0000 mg | ORAL_TABLET | Freq: Every day | ORAL | 11 refills | Status: AC

## 2022-11-28 MED ORDER — MEDROXYPROGESTERONE ACETATE 10 MG PO TABS: 10.0000 mg | ORAL_TABLET | Freq: Every day | ORAL | 1 refills | Status: DC

## 2022-11-28 MED ORDER — MELOXICAM 15 MG PO TABS: 15.0000 mg | ORAL_TABLET | Freq: Every day | ORAL | 0 refills | Status: DC

## 2022-11-28 NOTE — Progress Notes (Signed)
Subjective:    Patient ID: Ashlee Hoffman, female    DOB: [birth year], 42 y.o.   MRN: [id number]   Chief Complaint: Right shoulder pain [place] a couple months back)   HPI   Social history: Lives with: parents Work history: disabled   Comes in today for follow up of the following chronic medical issues:  1. Pain of right upper extremity Patient fell a few months ago and landed on her right arm. Arm has ben hurting intermittently since. Pain can be when she is sitting still or moving it. Hurts to lay on.   2. Body mass index (BMI) 40.0-44.9, adult (HCC) No recent weight changes Wt Readings from Last 3 Encounters:  11/28/22 234 lb (106.1 kg)  10/07/22 238 lb (108 kg)  08/26/22 238 lb (108 kg)   BMI Readings from Last 3 Encounters:  11/28/22 41.45 kg/m  10/07/22 42.16 kg/m  08/26/22 42.16 kg/m     3. Mentally disabled Lives with her mom and step dad. Is not able to live alone.  4. Pure hypercholesterolemia Eats whatever her mom gives her . Likes to snack Lab Results  Component Value Date   CHOL 183 08/26/2022   HDL 40 08/26/2022   LDLCALC 114 (H) 08/26/2022   TRIG 161 (H) 08/26/2022   CHOLHDL 4.6 (H) 08/26/2022      New complaints: None today  Allergies  Allergen Reactions   Septra [Sulfamethoxazole-Trimethoprim] Itching   Sulfamethoxazole    Trimethoprim    Outpatient Encounter Medications as of 11/28/2022  Medication Sig   medroxyPROGESTERone (PROVERA) 10 MG tablet Take 1 tablet (10 mg total) by mouth daily.   omeprazole (PRILOSEC) 40 MG capsule TAKE 1 CAPSULE (40 MG TOTAL) BY MOUTH DAILY. (NEEDS TO BE SEEN BEFORE NEXT REFILL)   [DISCONTINUED] ciclopirox (PENLAC) 8 % solution APPLY TOPICALLY AT BEDTIME. APPLY OVER NAIL AND SURROUNDING SKIN. APPLY DAILY OVER PREVIOUS COAT. AFTER SEVEN (7) DAYS, MAY REMOVE WITH ALCOHOL AND CONTINUE CYCLE.   [DISCONTINUED] ketoconazole (NIZORAL) 2 % cream Apply 1 application topically daily.   Acetaminophen (TYLENOL  ARTHRITIS EXT RELIEF PO) Take 1-2 tablets by mouth as needed. Reported on 01/12/2016 (Patient not taking: Reported on 11/28/2022)   No facility-administered encounter medications on file as of 11/28/2022.    Past Surgical History:  Procedure Laterality Date   LUMBAR LAMINECTOMY/DECOMPRESSION MICRODISCECTOMY N/A 05/24/2013   Procedure: LUMBAR LAMINECTOMY/DECOMPRESSION MICRODISCECTOMY 1 LEVEL LEFT LUMBAR FIVE-SACARAL-ONE;  Surgeon: Charlie Pitter, MD;  Location: [facility];  Service: Neurosurgery;  Laterality: N/A;  left    Family History  Problem Relation Age of Onset   Hypertension Mother    Arthritis Mother    Diabetes Maternal Grandfather    Hypertension Maternal Grandfather    Heart disease Maternal Grandfather    Breast cancer Neg Hx      Controlled substance contract: n/a       Review of Systems  Constitutional:  Negative for diaphoresis.  Eyes:  Negative for pain.  Respiratory:  Negative for shortness of breath.   Cardiovascular:  Negative for chest pain, palpitations and leg swelling.  Gastrointestinal:  Negative for abdominal pain.  Endocrine: Negative for polydipsia.  Musculoskeletal:  Positive for arthralgias (right shoulder).  Skin:  Negative for rash.  Neurological:  Negative for dizziness, weakness and headaches.  Hematological:  Does not bruise/bleed easily.  All other systems reviewed and are negative.      Objective:   Physical Exam Vitals and nursing note reviewed.  Constitutional:  General: She is not in acute distress.    Appearance: Normal appearance. She is well-developed.  Neck:     Vascular: No carotid bruit or JVD.  Cardiovascular:     Rate and Rhythm: Normal rate and regular rhythm.     Heart sounds: Normal heart sounds.  Pulmonary:     Effort: Pulmonary effort is normal. No respiratory distress.     Breath sounds: Normal breath sounds. No wheezing or rales.  Chest:     Chest wall: No tenderness.  Abdominal:     General: Bowel  sounds are normal. There is no distension or abdominal bruit.     Palpations: Abdomen is soft. There is no hepatomegaly, splenomegaly, mass or pulsatile mass.     Tenderness: There is no abdominal tenderness.  Musculoskeletal:        General: Normal range of motion.     Cervical back: Normal range of motion and neck supple.     Comments: FROM of right shoulder without pain Pain in right upper arm with movement Grips equal bil  Lymphadenopathy:     Cervical: No cervical adenopathy.  Skin:    General: Skin is warm and dry.  Neurological:     Mental Status: She is alert and oriented to person, place, and time.     Deep Tendon Reflexes: Reflexes are normal and symmetric.  Psychiatric:        Behavior: Behavior normal.        Thought Content: Thought content normal.        Judgment: Judgment normal.    BP 121/79   Pulse 82   Temp 97.9 F (36.6 C) (Temporal)   Resp 20   Ht '5\' 3"'$  (1.6 m)   Wt 234 lb (106.1 kg)   SpO2 97%   BMI 41.45 kg/m   Right humeral xray normal-Preliminary reading by Ronnald Collum, FNP  [facility]       Assessment & Plan:  Ashlee Hoffman in today with chief complaint of Right shoulder pain [place] a couple months back)   1. Pain of right upper extremity Moist heat  rest - DG Humerus Right - meloxicam (MOBIC) 15 MG tablet; Take 1 tablet (15 mg total) by mouth daily.  Dispense: 30 tablet; Refill: 0  2. Body mass index (BMI) 40.0-44.9, adult (HCC) Discussed diet and exercise for person with BMI >25 Will recheck weight in 3-6 months   3. Mentally disabled   4. Morbid obesity ([place]) Discussed diet and exercise for person with BMI >25 Will recheck weight in 3-6 months    5. Pure hypercholesterolemia Low fat diet  6. Seasonal allergic rhinitis due to pollen - cetirizine (ZYRTEC) 10 MG tablet; Take 1 tablet (10 mg total) by mouth daily.  Dispense: 30 tablet; Refill: 11    The above assessment and management plan was discussed with the patient. The patient  verbalized understanding of and has agreed to the management plan. Patient is aware to call the clinic if symptoms persist or worsen. Patient is aware when to return to the clinic for a follow-up visit. Patient educated on when it is appropriate to go to the emergency department.   Mary-Margaret Hassell Done, FNP

## 2022-11-28 NOTE — Patient Instructions (Signed)

## 2022-12-03 ENCOUNTER — Ambulatory Visit (INDEPENDENT_AMBULATORY_CARE_PROVIDER_SITE_OTHER): Payer: 59 | Admitting: Podiatry

## 2022-12-03 DIAGNOSIS — B351 Tinea unguium: Secondary | ICD-10-CM | POA: Diagnosis not present

## 2022-12-03 DIAGNOSIS — M79674 Pain in right toe(s): Secondary | ICD-10-CM

## 2022-12-03 MED ORDER — CICLOPIROX 8 % EX SOLN: Freq: Every day | CUTANEOUS | 2 refills | Status: DC

## 2022-12-03 NOTE — Progress Notes (Signed)
Subjective: Chief Complaint  Patient presents with   routine foot care    42 year old female presents with her parents for concerns of thick elongated toenails that she is not able to trim her self and she also has calluses that cause discomfort.  Otherwise she has been doing well no other pain.  She got new shoes which help control her ankle.    Objective: AAO x3, NAD DP/PT pulses palpable bilaterally, CRT less than 3 seconds Nails to be hypertrophic, dystrophic yellow discoloration.  There is some clearing on the proximal nail fold.  No edema, erythema or signs of infection.  Hyperkeratotic lesions medial hallux without any underlying ulceration drainage or signs of infection.  No open lesions. No significant pain to the ankle and there is no other areas of tenderness noted today. No pain with calf compression, swelling, warmth, erythema  Assessment: Symptomatic onychomycosis, hyperkeratotic lesions  Plan: -All treatment options discussed with the patient including all alternatives, risks, complications.  -Sharply debrided nails x 10 without any complications or bleeding.  Continue topical medication -Sharp debrided hyperkeratotic lesions x 2 without any complications or bleeding.  Moisturizer, offloading. -Patient encouraged to call the office with any questions, concerns, change in symptoms.   Trula Slade DPM

## 2023-01-01 ENCOUNTER — Other Ambulatory Visit: Payer: Self-pay | Admitting: Nurse Practitioner

## 2023-01-01 DIAGNOSIS — M79601 Pain in right arm: Secondary | ICD-10-CM

## 2023-01-21 DIAGNOSIS — M25511 Pain in right shoulder: Secondary | ICD-10-CM | POA: Diagnosis not present

## 2023-02-04 ENCOUNTER — Ambulatory Visit (INDEPENDENT_AMBULATORY_CARE_PROVIDER_SITE_OTHER): Payer: 59 | Admitting: Podiatry

## 2023-02-04 DIAGNOSIS — M79674 Pain in right toe(s): Secondary | ICD-10-CM | POA: Diagnosis not present

## 2023-02-04 DIAGNOSIS — Q828 Other specified congenital malformations of skin: Secondary | ICD-10-CM

## 2023-02-04 DIAGNOSIS — B351 Tinea unguium: Secondary | ICD-10-CM

## 2023-02-04 NOTE — Progress Notes (Signed)
Subjective: Chief Complaint  Patient presents with   Nail Problem    Patient states that she thinks a new nail is coming out from the right hallux.     42 year old female presents with her parents for concerns of thick elongated toenails that she is not able to trim her self and she also has calluses that cause discomfort.  She has been using the [place].   Objective: AAO x3, NAD DP/PT pulses palpable bilaterally, CRT less than 3 seconds Nails to be hypertrophic, dystrophic yellow discoloration.  There is some clearing on the proximal nail fold on the lesser toe but not the hallux.  No edema, erythema or signs of infection.  Hyperkeratotic lesions medial hallux without any underlying ulceration drainage or signs of infection.  No open lesions. No significant pain to the ankle and there is no other areas of tenderness noted today. No pain with calf compression, swelling, warmth, erythema  Assessment: Symptomatic onychomycosis, hyperkeratotic lesions  Plan: -All treatment options discussed with the patient including all alternatives, risks, complications.  -Sharply debrided nails x 10 without any complications or bleeding.  Continue topical medication -Sharp debrided hyperkeratotic lesions x 2 without any complications or bleeding.  Moisturizer, offloading. -Patient encouraged to call the office with any questions, concerns, change in symptoms.   Vivi Barrack DPM

## 2023-03-17 DIAGNOSIS — M25511 Pain in right shoulder: Secondary | ICD-10-CM | POA: Diagnosis not present

## 2023-04-03 DIAGNOSIS — M5414 Radiculopathy, thoracic region: Secondary | ICD-10-CM | POA: Diagnosis not present

## 2023-04-08 ENCOUNTER — Ambulatory Visit: Payer: 59 | Admitting: Podiatry

## 2023-04-08 DIAGNOSIS — Q828 Other specified congenital malformations of skin: Secondary | ICD-10-CM | POA: Diagnosis not present

## 2023-04-08 DIAGNOSIS — B351 Tinea unguium: Secondary | ICD-10-CM

## 2023-04-08 DIAGNOSIS — M79674 Pain in right toe(s): Secondary | ICD-10-CM

## 2023-04-13 NOTE — Progress Notes (Signed)
Subjective: Chief Complaint  Patient presents with   Nail Problem    Routine foot care, nail trim, right foot 3rd toe callus     42 year old female presents with her parents for concerns of thick elongated toenails that she is not able to trim her self and she also has calluses that cause discomfort.  She gets a callus between her toes which causes discomfort.  No open lesions.  She has been using the Penlac.   Objective: AAO x3, NAD DP/PT pulses palpable bilaterally, CRT less than 3 seconds Nails to be hypertrophic, dystrophic yellow discoloration.  There is some clearing on the proximal nail fold on the lesser toe but not the hallux.  No edema, erythema or signs of infection.  Hyperkeratotic lesions medial hallux as well as along the third digit interdigitally without any underlying ulceration drainage or signs of infection.  No open lesions. No significant pain to the ankle and there is no other areas of tenderness noted today. No pain with calf compression, swelling, warmth, erythema  Assessment: Symptomatic onychomycosis, hyperkeratotic lesions  Plan: -All treatment options discussed with the patient including all alternatives, risks, complications.  -Sharply debrided nails x 10 without any complications or bleeding.  Continue topical medication -Sharp debrided hyperkeratotic lesions x 3 without any complications or bleeding.  Moisturizer, offloading. -Patient encouraged to call the office with any questions, concerns, change in symptoms.   Vivi Barrack DPM

## 2023-06-09 ENCOUNTER — Ambulatory Visit: Payer: 59 | Admitting: Podiatry

## 2023-06-12 ENCOUNTER — Ambulatory Visit: Payer: 59 | Admitting: Podiatry

## 2023-06-24 DIAGNOSIS — M5414 Radiculopathy, thoracic region: Secondary | ICD-10-CM | POA: Diagnosis not present

## 2023-06-26 DIAGNOSIS — M25511 Pain in right shoulder: Secondary | ICD-10-CM | POA: Diagnosis not present

## 2023-07-03 ENCOUNTER — Ambulatory Visit (INDEPENDENT_AMBULATORY_CARE_PROVIDER_SITE_OTHER): Payer: 59 | Admitting: Podiatry

## 2023-07-03 DIAGNOSIS — M79674 Pain in right toe(s): Secondary | ICD-10-CM | POA: Diagnosis not present

## 2023-07-03 DIAGNOSIS — B351 Tinea unguium: Secondary | ICD-10-CM

## 2023-07-03 DIAGNOSIS — M7751 Other enthesopathy of right foot: Secondary | ICD-10-CM

## 2023-07-03 DIAGNOSIS — Q828 Other specified congenital malformations of skin: Secondary | ICD-10-CM

## 2023-07-03 MED ORDER — DICLOFENAC SODIUM 1 % EX GEL: 2.0000 g | Freq: Four times a day (QID) | CUTANEOUS | 2 refills | Status: AC

## 2023-07-03 NOTE — Patient Instructions (Signed)

## 2023-07-08 NOTE — Progress Notes (Signed)
Subjective: Chief Complaint  Patient presents with   Callouses    Patient is here for RFC and callous removal has several , and hammer toes causing pain     42 year old female presents with her parents for concerns of thick elongated toenails that she is not able to trim her self and she also has calluses that cause discomfort.  She gets calluses between her toes which are causing pain.  She tried toe spacers without significant improvement.  No open lesions.  She is still getting pain to her ankle as well onto the lateral aspect.  Objective: AAO x3, NAD DP/PT pulses palpable bilaterally, CRT less than 3 seconds Nails to be hypertrophic, dystrophic yellow discoloration.  There is some clearing on the proximal nail fold on the lesser toe but not the hallux.  No edema, erythema or signs of infection.  Hyperkeratotic lesions medial hallux as well as along the third digit interdigitally without any underlying ulceration drainage or signs of infection.  No open lesions. She still gets tenderness to her ankle most lateral aspect and most the pain placed on the sinus tarsi.  Flexor, extensor tendons.  Intact.  There is no erythema or tenderness. No significant pain to the ankle and there is no other areas of tenderness noted today. No pain with calf compression, swelling, warmth, erythema  Assessment: Symptomatic onychomycosis, hyperkeratotic lesions  Plan: -All treatment options discussed with the patient including all alternatives, risks, complications.  -Sharply debrided nails x 10 without any complications or bleeding.  Continue topical medication -Sharp debrided hyperkeratotic lesions x 3 without any complications or bleeding.  Moisturizer, offloading. -Offered steroid injection she wants to proceed with this today. -Patient encouraged to call the office with any questions, concerns, change in symptoms.   Vivi Barrack DPM

## 2023-08-27 ENCOUNTER — Other Ambulatory Visit: Payer: Self-pay | Admitting: Nurse Practitioner

## 2023-09-04 ENCOUNTER — Encounter: Payer: Self-pay | Admitting: Podiatry

## 2023-09-04 ENCOUNTER — Ambulatory Visit (INDEPENDENT_AMBULATORY_CARE_PROVIDER_SITE_OTHER): Payer: 59 | Admitting: Podiatry

## 2023-09-04 DIAGNOSIS — B351 Tinea unguium: Secondary | ICD-10-CM | POA: Diagnosis not present

## 2023-09-04 DIAGNOSIS — M79674 Pain in right toe(s): Secondary | ICD-10-CM | POA: Diagnosis not present

## 2023-09-04 DIAGNOSIS — Q828 Other specified congenital malformations of skin: Secondary | ICD-10-CM

## 2023-09-12 NOTE — Progress Notes (Signed)
Subjective: Chief Complaint  Patient presents with   RFC    Rm#[id number] RFC     42-year-old female presents with her parents for concerns of thick elongated toenails that she is not able to trim her self and she also has calluses that cause discomfort.  Still gets occasional pain to her ankle.  No recent injury or changes otherwise.  Objective: AAO x3, NAD DP/PT pulses palpable bilaterally, CRT less than 3 seconds Nails to be hypertrophic, dystrophic yellow discoloration.  There is some clearing on the proximal nail fold on the lesser toe but not the hallux.  No edema, erythema or signs of infection.   Hyperkeratotic lesions medial hallux as well as along the third digit interdigitally without any underlying ulceration drainage or signs of infection.  No open lesions. She still gets intermittent tenderness to her ankle most lateral aspect and most the pain placed on the sinus tarsi.  Flexor, extensor tendons.  Intact.  There is no erythema or tenderness. No significant pain to the ankle and there is no other areas of tenderness noted today. No pain with calf compression, swelling, warmth, erythema  Assessment: Symptomatic onychomycosis, hyperkeratotic lesions  Plan: -All treatment options discussed with the patient including all alternatives, risks, complications.  -Sharply debrided nails x 10 without any complications or bleeding.  Continue topical medication -Sharp debrided hyperkeratotic lesions x 3 without any complications or bleeding.  Moisturizer, offloading. -Held off and repeat injection today.  Dispensed new pair of power steps.  Consider advanced imaging. -Patient encouraged to call the office with any questions, concerns, change in symptoms.   Vivi Barrack DPM

## 2023-09-19 ENCOUNTER — Other Ambulatory Visit: Payer: Self-pay | Admitting: Nurse Practitioner

## 2023-09-23 ENCOUNTER — Other Ambulatory Visit: Payer: Self-pay | Admitting: Nurse Practitioner

## 2023-09-25 DIAGNOSIS — M5414 Radiculopathy, thoracic region: Secondary | ICD-10-CM | POA: Diagnosis not present

## 2023-10-01 DIAGNOSIS — M25511 Pain in right shoulder: Secondary | ICD-10-CM | POA: Diagnosis not present

## 2023-10-13 ENCOUNTER — Ambulatory Visit: Payer: 59

## 2023-10-13 VITALS — Ht 63.0 in | Wt 234.0 lb

## 2023-10-13 DIAGNOSIS — Z1231 Encounter for screening mammogram for malignant neoplasm of breast: Secondary | ICD-10-CM

## 2023-10-13 DIAGNOSIS — Z0000 Encounter for general adult medical examination without abnormal findings: Secondary | ICD-10-CM | POA: Diagnosis not present

## 2023-10-13 NOTE — Patient Instructions (Signed)
Ms. Wiens , Thank you for taking time to come for your Medicare Wellness Visit. I appreciate your ongoing commitment to your health goals. Please review the following plan we discussed and let me know if I can assist you in the future.   Referrals/Orders/Follow-Ups/Clinician Recommendations: Aim for 30 minutes of exercise or brisk walking, 6-8 glasses of water, and 5 servings of fruits and vegetables each day.  This is a list of the screening recommended for you and due dates:  Health Maintenance  Topic Date Due   HIV Screening  Never done   Hepatitis C Screening  Never done   Pap with HPV screening  Never done   Flu Shot  Never done   COVID-19 Vaccine (1 - 2024-25 season) Never done   Medicare Annual Wellness Visit  10/12/2024   DTaP/Tdap/Td vaccine (2 - Td or Tdap) 12/19/2024   HPV Vaccine  Aged Out    Advanced directives: (ACP Link)Information on Advanced Care Planning can be found at [facility] Advance Health Care Directives Advance Health Care Directives ([email or web address]   Next Medicare Annual Wellness Visit scheduled for next year: Yes

## 2023-10-13 NOTE — Progress Notes (Signed)
Subjective:   Ashlee Hoffman is a 43 y.o. female who presents for Medicare Annual (Subsequent) preventive examination.  Visit Complete: Virtual I connected with  Ashlee Hoffman on 10/13/23 by a audio enabled telemedicine application and verified that I am speaking with the correct person using two identifiers.  Patient Location: Home  Provider Location: Home Office  This patient declined Interactive audio and video telecommunications. Therefore the visit was completed with audio only.  I discussed the limitations of evaluation and management by telemedicine. The patient expressed understanding and agreed to proceed.  Vital Signs: Because this visit was a virtual/telehealth visit, some criteria may be missing or patient reported. Any vitals not documented were not able to be obtained and vitals that have been documented are patient reported.  Cardiac Risk Factors include: obesity (BMI >30kg/m2);sedentary lifestyle     Objective:    Today's Vitals   10/13/23 1627  Weight: 234 lb (106.1 kg)  Height: 5\' 3"  (1.6 m)   Body mass index is 41.45 kg/m.     10/13/2023    4:32 PM 10/07/2022    1:41 PM 07/16/2021    1:20 PM 12/19/2020    6:15 PM 07/05/2020    7:02 PM 06/10/2016   11:55 AM 12/20/2014    4:42 PM  Advanced Directives  Does Patient Have a Medical Advance Directive? No No No No No Yes Yes  Type of Psychologist, occupational Power of Attorney  Does patient want to make changes to medical advance directive?      No - Patient declined   Copy of Healthcare Power of Attorney in Chart?      No - copy requested No - copy requested  Would patient like information on creating a medical advance directive? Yes (MAU/Ambulatory/Procedural Areas - Information given) No - Patient declined No - Patient declined        Current Medications (verified) Outpatient Encounter Medications as of 10/13/2023  Medication Sig   Acetaminophen (TYLENOL ARTHRITIS EXT RELIEF  PO) Take 1-2 tablets by mouth as needed. Reported on 01/12/2016   cetirizine (ZYRTEC) 10 MG tablet Take 1 tablet (10 mg total) by mouth daily.   ciclopirox (PENLAC) 8 % solution Apply topically at bedtime. Apply over nail and surrounding skin. Apply daily over previous coat. After seven (7) days, may remove with alcohol and continue cycle.   diclofenac Sodium (VOLTAREN) 1 % GEL Apply 2 g topically 4 (four) times daily. Rub into affected area of foot 2 to 4 times daily   medroxyPROGESTERone (PROVERA) 10 MG tablet Take 1 tablet (10 mg total) by mouth daily.   meloxicam (MOBIC) 15 MG tablet TAKE 1 TABLET (15 MG TOTAL) BY MOUTH DAILY.   No facility-administered encounter medications on file as of 10/13/2023.    Allergies (verified) Septra [sulfamethoxazole-trimethoprim], Sulfamethoxazole, and Trimethoprim   History: Past Medical History:  Diagnosis Date   Bulging lumbar disc 05/2013   Mental disability    Pure hypercholesterolemia 06/19/2021   Shortness of breath    with exertion   Past Surgical History:  Procedure Laterality Date   LUMBAR LAMINECTOMY/DECOMPRESSION MICRODISCECTOMY N/A 05/24/2013   Procedure: LUMBAR LAMINECTOMY/DECOMPRESSION MICRODISCECTOMY 1 LEVEL LEFT LUMBAR FIVE-SACARAL-ONE;  Surgeon: Temple Pacini, MD;  Location: [facility];  Service: Neurosurgery;  Laterality: N/A;  left   Family History  Problem Relation Age of Onset   Hypertension Mother    Arthritis Mother    Diabetes Maternal Grandfather  Hypertension Maternal Grandfather    Heart disease Maternal Grandfather    Breast cancer Neg Hx    Social History   Socioeconomic History   Marital status: Single    Spouse name: Not on file   Number of children: Not on file   Years of education: Not on file   Highest education level: Not on file  Occupational History   Not on file  Tobacco Use   Smoking status: Never   Smokeless tobacco: Never  Substance and Sexual Activity   Alcohol use: No   Drug use: No    Sexual activity: Never  Other Topics Concern   Not on file  Social History Narrative   Not on file   Social Drivers of Health   Financial Resource Strain: Low Risk  (10/13/2023)   Overall Financial Resource Strain (CARDIA)    Difficulty of Paying Living Expenses: Not hard at all  Food Insecurity: No Food Insecurity (10/13/2023)   Hunger Vital Sign    Worried About Running Out of Food in the Last Year: Never true    Ran Out of Food in the Last Year: Never true  Transportation Needs: No Transportation Needs (10/13/2023)   PRAPARE - Administrator, Civil Service (Medical): No    Lack of Transportation (Non-Medical): No  Physical Activity: Inactive (10/13/2023)   Exercise Vital Sign    Days of Exercise per Week: 0 days    Minutes of Exercise per Session: 0 min  Stress: No Stress Concern Present (10/13/2023)   [employer] of Occupational Health - Occupational Stress Questionnaire    Feeling of Stress : Not at all  Social Connections: Moderately Isolated (10/13/2023)   Social Connection and Isolation Panel [NHANES]    Frequency of Communication with Friends and Family: More than three times a week    Frequency of Social Gatherings with Friends and Family: Three times a week    Attends Religious Services: 1 to 4 times per year    Active Member of Clubs or Organizations: No    Attends Banker Meetings: Never    Marital Status: Never married    Tobacco Counseling Counseling given: Not Answered   Clinical Intake:  Pre-visit preparation completed: Yes  Pain : No/denies pain     Diabetes: No  How often do you need to have someone help you when you read instructions, pamphlets, or other written materials from your doctor or pharmacy?: 1 - Never  Interpreter Needed?: No  Comments: Assisted in visit by mother Information entered by :: Kandis Fantasia LPN   Activities of Daily Living    10/13/2023    4:31 PM  In your present state of health, do you  have any difficulty performing the following activities:  Hearing? 0  Vision? 0  Difficulty concentrating or making decisions? 0  Walking or climbing stairs? 0  Dressing or bathing? 0  Doing errands, shopping? 0  Preparing Food and eating ? N  Using the Toilet? N  In the past six months, have you accidently leaked urine? N  Do you have problems with loss of bowel control? N  Managing your Medications? N  Managing your Finances? N  Housekeeping or managing your Housekeeping? N    Patient Care Team: Bennie Pierini, FNP as PCP - General (Nurse Practitioner) Julio Sicks, MD as Consulting Physician (Neurosurgery) Vivi Barrack, DPM as Consulting Physician (Podiatry)  Indicate any recent Medical Services you may have received from other than [facility] providers in  the past year (date may be approximate).     Assessment:   This is a routine wellness examination for [place].  Hearing/Vision screen Hearing Screening - Comments:: Denies hearing difficulties   Vision Screening - Comments:: No vision problems; will schedule routine eye exam soon     Goals Addressed   None   Depression Screen    10/13/2023    4:30 PM 11/28/2022    2:17 PM 10/07/2022    1:41 PM 08/26/2022    9:50 AM 07/16/2021    1:17 PM 06/19/2021    3:17 PM 11/20/2020   10:07 AM  PHQ 2/9 Scores  PHQ - 2 Score 0 0 0 0 0 0 0  PHQ- 9 Score  0  0 0 0     Fall Risk    10/13/2023    4:31 PM 11/28/2022    2:16 PM 10/07/2022    1:40 PM 08/26/2022    9:50 AM 07/16/2021    1:21 PM  Fall Risk   Falls in the past year? 0 0 1 1 0  Number falls in past yr: 0  0 0 0  Injury with Fall? 0  0 0 0  Risk for fall due to : No Fall Risks  History of fall(s) History of fall(s) Impaired balance/gait  Follow up Falls prevention discussed;Education provided;Falls evaluation completed  Falls evaluation completed;Education provided;Falls prevention discussed Education provided Falls prevention discussed    MEDICARE RISK AT  HOME: Medicare Risk at Home Any stairs in or around the home?: No If so, are there any without handrails?: No Home free of loose throw rugs in walkways, pet beds, electrical cords, etc?: Yes Adequate lighting in your home to reduce risk of falls?: Yes Life alert?: No Use of a cane, walker or w/c?: No Grab bars in the bathroom?: Yes Shower chair or bench in shower?: No Elevated toilet seat or a handicapped toilet?: Yes  TIMED UP AND GO:  Was the test performed?  No    Cognitive Function:    06/10/2016    3:57 PM 12/20/2014    4:20 PM 12/20/2014    2:38 PM  MMSE - Mini Mental State Exam  Not completed: Unable to complete  Refused  Orientation to time  4   Orientation to Place  3   Registration  3   Attention/ Calculation  0   Recall  1   Language- name 2 objects  2   Language- repeat  1   Language- follow 3 step command  1   Language- read & follow direction  1   Write a sentence  1   Copy design  0   Total score  17         10/13/2023    4:31 PM 10/07/2022    1:42 PM  6CIT Screen  What Year? 0 points 0 points  What month? 0 points 0 points  What time? 0 points 0 points  Count back from 20 0 points 0 points  Months in reverse 0 points 0 points  Repeat phrase 0 points 0 points  Total Score 0 points 0 points    Immunizations Immunization History  Administered Date(s) Administered   Tdap 12/20/2014    TDAP status: Up to date  Flu Vaccine status: Declined, Education has been provided regarding the importance of this vaccine but patient still declined. Advised may receive this vaccine at local pharmacy or Health Dept. Aware to provide a copy of the vaccination record if obtained  from local pharmacy or Health Dept. Verbalized acceptance and understanding.  Pneumococcal vaccine status: Up to date  Covid-19 vaccine status: Information provided on how to obtain vaccines.   Qualifies for Shingles Vaccine? No    Screening Tests Health Maintenance  Topic Date Due    HIV Screening  Never done   Hepatitis C Screening  Never done   Cervical Cancer Screening (HPV/Pap Cotest)  Never done   INFLUENZA VACCINE  Never done   COVID-19 Vaccine (1 - 2024-25 season) Never done   Medicare Annual Wellness (AWV)  10/12/2024   DTaP/Tdap/Td (2 - Td or Tdap) 12/19/2024   HPV VACCINES  Aged Out    Health Maintenance  Health Maintenance Due  Topic Date Due   HIV Screening  Never done   Hepatitis C Screening  Never done   Cervical Cancer Screening (HPV/Pap Cotest)  Never done   INFLUENZA VACCINE  Never done   COVID-19 Vaccine (1 - 2024-25 season) Never done    Mammogram status: Ordered and scheduled. Pt provided with contact info and advised to call to schedule appt.   Lung Cancer Screening: (Low Dose CT Chest recommended if Age 76-80 years, 20 pack-year currently smoking OR have quit w/in 15years.) does not qualify.   Lung Cancer Screening Referral: n/a  Additional Screening:  Hepatitis C Screening: does qualify;  Vision Screening: Recommended annual ophthalmology exams for early detection of glaucoma and other disorders of the eye. Is the patient up to date with their annual eye exam?  No  Who is the provider or what is the name of the office in which the patient attends annual eye exams? none If pt is not established with a provider, would they like to be referred to a provider to establish care? No .   Dental Screening: Recommended annual dental exams for proper oral hygiene  Community Resource Referral / Chronic Care Management: CRR required this visit?  No   CCM required this visit?  No     Plan:     I have personally reviewed and noted the following in the patient's chart:   Medical and social history Use of alcohol, tobacco or illicit drugs  Current medications and supplements including opioid prescriptions. Patient is not currently taking opioid prescriptions. Functional ability and status Nutritional status Physical activity Advanced  directives List of other physicians Hospitalizations, surgeries, and ER visits in previous 12 months Vitals Screenings to include cognitive, depression, and falls Referrals and appointments  In addition, I have reviewed and discussed with patient certain preventive protocols, quality metrics, and best practice recommendations. A written personalized care plan for preventive services as well as general preventive health recommendations were provided to patient.     Kandis Fantasia [place], [place]   12/23/8117   After Visit Summary: (MyChart) Due to this being a telephonic visit, the after visit summary with patients personalized plan was offered to patient via MyChart   Nurse Notes: No concerns at this time

## 2023-10-14 ENCOUNTER — Telehealth: Payer: Self-pay

## 2023-10-14 ENCOUNTER — Ambulatory Visit (INDEPENDENT_AMBULATORY_CARE_PROVIDER_SITE_OTHER): Payer: 59 | Admitting: Nurse Practitioner

## 2023-10-14 ENCOUNTER — Other Ambulatory Visit (HOSPITAL_COMMUNITY): Payer: Self-pay

## 2023-10-14 ENCOUNTER — Encounter: Payer: Self-pay | Admitting: Nurse Practitioner

## 2023-10-14 ENCOUNTER — Telehealth: Payer: Self-pay | Admitting: Nurse Practitioner

## 2023-10-14 VITALS — BP 127/86 | HR 66 | Temp 97.9°F | Ht 63.0 in | Wt 227.0 lb

## 2023-10-14 DIAGNOSIS — Z309 Encounter for contraceptive management, unspecified: Secondary | ICD-10-CM | POA: Diagnosis not present

## 2023-10-14 DIAGNOSIS — E7800 Pure hypercholesterolemia, unspecified: Secondary | ICD-10-CM

## 2023-10-14 DIAGNOSIS — Z6841 Body Mass Index (BMI) 40.0 and over, adult: Secondary | ICD-10-CM

## 2023-10-14 DIAGNOSIS — F79 Unspecified intellectual disabilities: Secondary | ICD-10-CM

## 2023-10-14 LAB — LIPID PANEL

## 2023-10-14 MED ORDER — WEGOVY 0.25 MG/0.5ML ~~LOC~~ SOAJ: 0.2500 mg | SUBCUTANEOUS | 2 refills | Status: DC

## 2023-10-14 MED ORDER — MEDROXYPROGESTERONE ACETATE 10 MG PO TABS: 10.0000 mg | ORAL_TABLET | Freq: Every day | ORAL | 1 refills | Status: DC

## 2023-10-14 NOTE — Telephone Encounter (Signed)
Please review

## 2023-10-14 NOTE — Telephone Encounter (Signed)
PA request has been Submitted. New Encounter created for follow up. For additional info see Pharmacy Prior Auth telephone encounter from 10/14/23.

## 2023-10-14 NOTE — Patient Instructions (Signed)

## 2023-10-14 NOTE — Telephone Encounter (Signed)
Pharmacy Patient Advocate Encounter  Received notification from [facility] that Prior Authorization for [facility] 0.25MG /0.5ML auto-injectors  has been DENIED.  See denial reason below. No denial letter attached in CMM. Will attach denial letter to Media tab once received.   PA #/Case ID/Reference #: [id number]   DENIAL REASON: Plan exclusion

## 2023-10-14 NOTE — Telephone Encounter (Signed)
  Name from pharmacy: WEGOVY 0.25 MG/0.5 ML PEN   Pharmacy comment: Alternative Requested:PRIOR AUTH NEEDED

## 2023-10-14 NOTE — Telephone Encounter (Signed)
Pharmacy Patient Advocate Encounter   Received notification from Pt Calls Messages that prior authorization for Wegovy 0.25MG /0.5ML auto-injectors is required/requested.   Insurance verification completed.   The patient is insured through [facility] .   Per test claim: PA required; PA submitted to above mentioned insurance via CoverMyMeds Key/confirmation #/EOC [id number] Status is pending

## 2023-10-14 NOTE — Progress Notes (Signed)
Subjective:    Patient ID: Ashlee Hoffman, female    DOB: 1981/05/18, 43 y.o.   MRN: [id number]   Chief Complaint: medical management of chronic issues     HPI:  Ashlee Hoffman is a 43 y.o. who identifies as a female who was assigned female at birth.   Social history: Lives with: parents Work history: disabled   Comes in today for follow up of the following chronic medical issues:  1. Mentally disabled She lives with her mom and step dad; seldomly left home alone. She is able to perform ADLs for self care independently. Very pleasant and interactive throughout visit.  2. Pure hypercholesterolemia On no medications. Does not particularly watch her diet. Lab Results  Component Value Date   CHOL 183 08/26/2022   HDL 40 08/26/2022   LDLCALC 114 (H) 08/26/2022   TRIG 161 (H) 08/26/2022   CHOLHDL 4.6 (H) 08/26/2022     3. Body mass index (BMI) 40.0-44.9, adult (HCC) Weight is down 7lbs Wt Readings from Last 3 Encounters:  10/14/23 227 lb (103 kg)  10/13/23 234 lb (106.1 kg)  11/28/22 234 lb (106.1 kg)   BMI Readings from Last 3 Encounters:  10/14/23 40.21 kg/m  10/13/23 41.45 kg/m  11/28/22 41.45 kg/m        New complaints: None today  Allergies  Allergen Reactions   Septra [Sulfamethoxazole-Trimethoprim] Itching   Sulfamethoxazole    Trimethoprim    Outpatient Encounter Medications as of 10/14/2023  Medication Sig   Acetaminophen (TYLENOL ARTHRITIS EXT RELIEF PO) Take 1-2 tablets by mouth as needed. Reported on 01/12/2016   cetirizine (ZYRTEC) 10 MG tablet Take 1 tablet (10 mg total) by mouth daily.   ciclopirox (PENLAC) 8 % solution Apply topically at bedtime. Apply over nail and surrounding skin. Apply daily over previous coat. After seven (7) days, may remove with alcohol and continue cycle.   diclofenac Sodium (VOLTAREN) 1 % GEL Apply 2 g topically 4 (four) times daily. Rub into affected area of foot 2 to 4 times daily   medroxyPROGESTERone (PROVERA) 10 MG  tablet Take 1 tablet (10 mg total) by mouth daily.   meloxicam (MOBIC) 15 MG tablet TAKE 1 TABLET (15 MG TOTAL) BY MOUTH DAILY.   No facility-administered encounter medications on file as of 10/14/2023.    Past Surgical History:  Procedure Laterality Date   LUMBAR LAMINECTOMY/DECOMPRESSION MICRODISCECTOMY N/A 05/24/2013   Procedure: LUMBAR LAMINECTOMY/DECOMPRESSION MICRODISCECTOMY 1 LEVEL LEFT LUMBAR FIVE-SACARAL-ONE;  Surgeon: Temple Pacini, MD;  Location: [facility];  Service: Neurosurgery;  Laterality: N/A;  left    Family History  Problem Relation Age of Onset   Hypertension Mother    Arthritis Mother    Diabetes Maternal Grandfather    Hypertension Maternal Grandfather    Heart disease Maternal Grandfather    Breast cancer Neg Hx       Controlled substance contract: n/a     Review of Systems  Constitutional:  Negative for activity change and fever.  HENT:  Negative for congestion.   Eyes:  Negative for visual disturbance.  Respiratory:  Negative for chest tightness and shortness of breath.   Cardiovascular:  Negative for chest pain and leg swelling.  Gastrointestinal:  Negative for abdominal pain.  Endocrine: Negative for polydipsia and polyuria.  Genitourinary:  Negative for difficulty urinating.  Neurological:  Negative for weakness and headaches.  All other systems reviewed and are negative.      Objective:   Physical Exam Vitals and nursing note  reviewed.  Constitutional:      General: She is not in acute distress.    Appearance: Normal appearance. She is well-groomed.  HENT:     Head: Normocephalic and atraumatic.     Right Ear: Tympanic membrane normal.     Left Ear: Tympanic membrane normal.     Nose: Nose normal.     Mouth/Throat:     Mouth: Mucous membranes are moist.     Pharynx: Oropharynx is clear.  Eyes:     Conjunctiva/sclera: Conjunctivae normal.     Pupils: Pupils are equal, round, and reactive to light.  Cardiovascular:     Rate and  Rhythm: Normal rate and regular rhythm.     Pulses: Normal pulses.     Heart sounds: Normal heart sounds.  Pulmonary:     Effort: Pulmonary effort is normal. No respiratory distress.     Breath sounds: Normal breath sounds. No wheezing, rhonchi or rales.  Abdominal:     General: Bowel sounds are normal.     Palpations: Abdomen is soft.     Tenderness: There is no abdominal tenderness.  Musculoskeletal:        General: Normal range of motion.     Right lower leg: No edema.     Left lower leg: No edema.  Lymphadenopathy:     Cervical: No cervical adenopathy.  Skin:    General: Skin is warm and dry.     Capillary Refill: Capillary refill takes less than 2 seconds.  Neurological:     General: No focal deficit present.     Mental Status: She is alert and oriented to person, place, and time.  Psychiatric:        Mood and Affect: Mood normal.        Behavior: Behavior normal. Behavior is cooperative.     BP 127/86   Pulse 66   Temp 97.9 F (36.6 C) (Temporal)   Ht 5\' 3"  (1.6 m)   Wt 227 lb (103 kg)   SpO2 99%   BMI 40.21 kg/m       Assessment & Plan:  Ashlee Hoffman comes in today with chief complaint of No chief complaint on file.   Diagnosis and orders addressed:  1. Mentally disabled Lives with her parents  2. Pure hypercholesterolemia Low fat diet - CBC with Differential/Platelet - CMP14+EGFR - Lipid panel  3. Body mass index (BMI) 40.0-44.9, adult (HCC) Discussed diet and exercise for person with BMI >25 Will recheck weight in 3-6 months  4. Birth control  Meds ordered this encounter  Medications   medroxyPROGESTERone (PROVERA) 10 MG tablet    Sig: Take 1 tablet (10 mg total) by mouth daily.    Dispense:  90 tablet    Refill:  1    Supervising Provider:   Arville Care A [[id number]]   Semaglutide-Weight Management (WEGOVY) 0.25 MG/0.5ML SOAJ    Sig: Inject 0.25 mg into the skin once a week.    Dispense:  2 mL    Refill:  2    Supervising Provider:    Arville Care A [id number]      Labs pending Health Maintenance reviewed Diet and exercise encouraged  Follow up plan: 6 months and prn   Ashlee Daphine Deutscher, FNP

## 2023-10-15 ENCOUNTER — Other Ambulatory Visit (HOSPITAL_COMMUNITY): Payer: Self-pay

## 2023-10-15 ENCOUNTER — Telehealth: Payer: Self-pay

## 2023-10-15 LAB — CMP14+EGFR
ALT: 20 [IU]/L (ref 0–32)
AST: 16 [IU]/L (ref 0–40)
Albumin: 4.3 g/dL (ref 3.9–4.9)
Alkaline Phosphatase: 45 [IU]/L (ref 44–121)
BUN/Creatinine Ratio: 17 (ref 9–23)
BUN: 16 mg/dL (ref 6–24)
Bilirubin Total: 0.4 mg/dL (ref 0.0–1.2)
CO2: 22 mmol/L (ref 20–29)
Calcium: 9.3 mg/dL (ref 8.7–10.2)
Chloride: 102 mmol/L (ref 96–106)
Creatinine, Ser: 0.93 mg/dL (ref 0.57–1.00)
Globulin, Total: 2.4 g/dL (ref 1.5–4.5)
Glucose: 66 mg/dL — ABNORMAL LOW (ref 70–99)
Potassium: 4.5 mmol/L (ref 3.5–5.2)
Sodium: 139 mmol/L (ref 134–144)
Total Protein: 6.7 g/dL (ref 6.0–8.5)
eGFR: 79 mL/min/{1.73_m2} (ref >59)

## 2023-10-15 LAB — CBC WITH DIFFERENTIAL/PLATELET
Basophils Absolute: 0 10*3/uL (ref 0.0–0.2)
Basos: 0 %
EOS (ABSOLUTE): 0.1 10*3/uL (ref 0.0–0.4)
Eos: 1 %
Hematocrit: 42.3 % (ref 34.0–46.6)
Hemoglobin: 14.1 g/dL (ref 11.1–15.9)
Immature Grans (Abs): 0 10*3/uL (ref 0.0–0.1)
Immature Granulocytes: 0 %
Lymphocytes Absolute: 1.4 10*3/uL (ref 0.7–3.1)
Lymphs: 15 %
MCH: 30.5 pg (ref 26.6–33.0)
MCHC: 33.3 g/dL (ref 31.5–35.7)
MCV: 91 fL (ref 79–97)
Monocytes Absolute: 0.8 10*3/uL (ref 0.1–0.9)
Monocytes: 8 %
Neutrophils Absolute: 7.4 10*3/uL — ABNORMAL HIGH (ref 1.4–7.0)
Neutrophils: 76 %
Platelets: 267 10*3/uL (ref 150–450)
RBC: 4.63 x10E6/uL (ref 3.77–5.28)
RDW: 12.1 % (ref 11.7–15.4)
WBC: 9.8 10*3/uL (ref 3.4–10.8)

## 2023-10-15 LAB — LIPID PANEL
Cholesterol, Total: 173 mg/dL (ref 100–199)
HDL: 44 mg/dL (ref >39)
LDL CALC COMMENT:: 3.9 ratio (ref 0.0–4.4)
LDL Chol Calc (NIH): 109 mg/dL — ABNORMAL HIGH (ref 0–99)
Triglycerides: 113 mg/dL (ref 0–149)
VLDL Cholesterol Cal: 20 mg/dL (ref 5–40)

## 2023-10-15 NOTE — Telephone Encounter (Signed)
Pharmacy Patient Advocate Encounter   Received notification from Pt Calls Messages that prior authorization for [facility] is required/requested.   Insurance verification completed.   The patient is insured through [facility] MEDICAID .   Per test claim: PA required; PA submitted to above mentioned insurance via Fax Key/confirmation #/EOC --- Status is pending    FAX #: [phone] PHONE #: [phone]

## 2023-10-15 NOTE — Telephone Encounter (Signed)
PA request has been Submitted to [facility]. New Encounter created for follow up. For additional info see Pharmacy Prior Auth telephone encounter from 10/15/23.

## 2023-10-23 ENCOUNTER — Other Ambulatory Visit (HOSPITAL_COMMUNITY): Payer: Self-pay

## 2023-10-23 NOTE — Telephone Encounter (Signed)
 Pharmacy Patient Advocate Encounter  Received notification from [place] MEDICAID that Prior Authorization for Wegovy  has been APPROVED from 10/15/23 to 04/12/24. Ran test claim, Copay is $4. This test claim was processed through [facility]- copay amounts may vary at other pharmacies due to pharmacy/plan contracts, or as the patient moves through the different stages of their insurance plan.   PA #/Case ID/Reference #: [id number]

## 2023-10-23 NOTE — Telephone Encounter (Signed)
 Lm on vm on 10/23/23 LS

## 2023-10-23 NOTE — Telephone Encounter (Signed)
 Mom called b/c she got a call from the office.  Message given concerning the PA approval for Wegovy .  Mom verbalized understanding.

## 2023-10-24 ENCOUNTER — Other Ambulatory Visit: Payer: Self-pay | Admitting: Nurse Practitioner

## 2023-10-24 DIAGNOSIS — Z6841 Body Mass Index (BMI) 40.0 and over, adult: Secondary | ICD-10-CM

## 2023-10-24 MED ORDER — WEGOVY 0.25 MG/0.5ML ~~LOC~~ SOAJ: 0.2500 mg | SUBCUTANEOUS | 2 refills | Status: DC

## 2023-10-24 NOTE — Telephone Encounter (Signed)
 Copied from CRM [id number]. Topic: Clinical - Medication Refill >> Oct 24, 2023  5:02 PM Ashlee Hoffman wrote: Most Recent Primary Care Visit:  Provider: GLADIS MUSTARD  Department: [facility] MED  Visit Type: OFFICE VISIT  Date: 10/14/2023  Medication: Semaglutide -Weight Management (WEGOVY ) 0.25 MG/0.5ML EMMANUEL [[id number]] DISCONTINUED  Has the patient contacted their pharmacy? Yes (Agent: If no, request that the patient contact the pharmacy for the refill. If patient does not wish to contact the pharmacy document the reason why and proceed with request.) (Agent: If yes, when and what did the pharmacy advise?)Pharmacy states it's not approved  Is this the correct pharmacy for this prescription? Yes If no, delete pharmacy and type the correct one.  This is the patient's preferred pharmacy:  [id number] - [place], [place] - [address] [place] [address] Phone: [phone] Fax: [phone]   Has the prescription been filled recently? Yes  Is the patient out of the medication? Yes  Has the patient been seen for an appointment in the last year OR does the patient have an upcoming appointment? Yes  Can we respond through MyChart? Yes  Agent: Please be advised that Rx refills may take up to 3 business days. We ask that you follow-up with your pharmacy.

## 2023-10-24 NOTE — Telephone Encounter (Signed)
 Patient notified.Marland Kitchen LS

## 2023-10-24 NOTE — Telephone Encounter (Signed)
 Per chart, refills available for requested medication. This RN called patient to notify and patient stated that she went to the pharmacy and they did not have the prescription. Routing to office.

## 2023-10-24 NOTE — Telephone Encounter (Signed)
 Duplicate encounter, confirmed script rcvd by pharmacy. No further action needed.

## 2023-10-24 NOTE — Telephone Encounter (Signed)
 Copied from CRM [id number]. Topic: Clinical - Medication Refill >> Oct 24, 2023  3:17 PM Laurier BROCKS wrote: Most Recent Primary Care Visit:  Provider: GLADIS MUSTARD  Department: [facility]  Visit Type: OFFICE VISIT  Date: 10/14/2023  Medication:  Semaglutide -Weight Management (WEGOVY ) 0.25 MG/0.5ML SOAJ  Has the patient contacted their pharmacy? Yes (Agent: If no, request that the patient contact the pharmacy for the refill. If patient does not wish to contact the pharmacy document the reason why and proceed with request.) (Agent: If yes, when and what did the pharmacy advise?)  Is this the correct pharmacy for this prescription? Yes If no, delete pharmacy and type the correct one.  This is the patient's preferred pharmacy:  [id number] - [place], [place] - [address] [place] [address] Phone: [phone] Fax: [phone]   Has the prescription been filled recently? No  Is the patient out of the medication? No  Has the patient been seen for an appointment in the last year OR does the patient have an upcoming appointment? Yes  Can we respond through MyChart? Yes  Agent: Please be advised that Rx refills may take up to 3 business days. We ask that you follow-up with your pharmacy.

## 2023-10-27 ENCOUNTER — Telehealth: Payer: Self-pay

## 2023-10-27 ENCOUNTER — Ambulatory Visit
Admission: RE | Admit: 2023-10-27 | Discharge: 2023-10-27 | Disposition: A | Discharge status: Home / Self Care | Payer: 59 | Source: Ambulatory Visit | Attending: Nurse Practitioner | Admitting: Nurse Practitioner

## 2023-10-27 ENCOUNTER — Other Ambulatory Visit (HOSPITAL_COMMUNITY): Payer: Self-pay

## 2023-10-27 DIAGNOSIS — M25511 Pain in right shoulder: Secondary | ICD-10-CM | POA: Diagnosis not present

## 2023-10-27 DIAGNOSIS — Z1231 Encounter for screening mammogram for malignant neoplasm of breast: Secondary | ICD-10-CM

## 2023-10-27 DIAGNOSIS — Z6841 Body Mass Index (BMI) 40.0 and over, adult: Secondary | ICD-10-CM

## 2023-10-27 NOTE — Telephone Encounter (Signed)
 Copied from CRM [id number]. Topic: Clinical - Prescription Issue >> Oct 24, 2023  5:04 PM Felizardo Hotter wrote: Reason for CRM: Pt's mother called stated pharmacy needs prior authorization for  Semaglutide -Weight Management (WEGOVY ) 0.25 MG/0.5ML [place] [[id number]] DISCONTINUED. Please call pt at [phone]

## 2023-10-27 NOTE — Telephone Encounter (Signed)
 PA was approved. Called pharmacy, they did not have patient's Medicaid on file. Gave them patient's insurance information, they received a paid claim.

## 2023-10-27 NOTE — Telephone Encounter (Signed)
 Was PA from Wegovy  sent through? Pharmacy is saying it still PA is pending and patients mother states that she received a letter stating that wegovy  will be covered for 6 months at a time. Please advise

## 2023-10-27 NOTE — Telephone Encounter (Signed)
 Pharmacy Patient Advocate Encounter  Insurance verification completed.   The patient is insured through [facility] MEDICAID   Ran test claim for WEGOVY . Currently a quantity of is a 28 day supply and the co-pay is $4 .  Called pharmacy to give them patient's medicaid number. Pharmacy received a paid claim.

## 2023-10-27 NOTE — Telephone Encounter (Signed)
 Mother was notified when she stopped by with paperwork to check with pharmacy this evening for rx

## 2023-10-27 NOTE — Telephone Encounter (Signed)
 PA cannot be discontinued, patient should not fill prescription if they do not want it.

## 2023-10-29 ENCOUNTER — Other Ambulatory Visit (HOSPITAL_COMMUNITY): Payer: Self-pay

## 2023-11-05 DIAGNOSIS — M75121 Complete rotator cuff tear or rupture of right shoulder, not specified as traumatic: Secondary | ICD-10-CM | POA: Diagnosis not present

## 2023-11-10 ENCOUNTER — Ambulatory Visit (INDEPENDENT_AMBULATORY_CARE_PROVIDER_SITE_OTHER): Payer: 59 | Admitting: Podiatry

## 2023-11-10 ENCOUNTER — Encounter: Payer: Self-pay | Admitting: Podiatry

## 2023-11-10 DIAGNOSIS — M79674 Pain in right toe(s): Secondary | ICD-10-CM | POA: Diagnosis not present

## 2023-11-10 DIAGNOSIS — B351 Tinea unguium: Secondary | ICD-10-CM | POA: Diagnosis not present

## 2023-11-10 NOTE — Progress Notes (Unsigned)
 Subjective: Chief Complaint  Patient presents with   RFC    RM#35 RFC     43 year old female presents with her mother for concerns of thick elongated toenails that she is not able to trim her self and she also has calluses that cause discomfort.  She has no other concerns today.  No recent injuries or changes.  Objective: AAO x3, NAD DP/PT pulses palpable bilaterally, CRT less than 3 seconds Nails to be hypertrophic, dystrophic yellow discoloration.  No edema, erythema or signs of infection.  Tenderness with nails 1-5 bilaterally. Hyperkeratotic lesions medial hallux as well as submetatarsal 5 bilaterally without any underlying ulceration, drainage or signs of infection.  No open lesions identified.  There is prominent metatarsal heads plantarly. No pain with calf compression, swelling, warmth, erythema  Assessment: Symptomatic onychomycosis, hyperkeratotic lesions  Plan: -All treatment options discussed with the patient including all alternatives, risks, complications.  -Sharply debrided nails x 10 without any complications or bleeding.  Continue topical medication -Sharply debrided hyperkeratotic lesions x 4 without any complications or bleeding.  I walk into regular.  There is termination.  Do think is beneficial to have the calluses trimmed a regular basis given the pain.  Moisturizer, offloading. -Continue shoes, good arch support. -Patient encouraged to call the office with any questions, concerns, change in symptoms.   Vivi Barrack DPM

## 2023-11-11 ENCOUNTER — Encounter

## 2023-12-02 ENCOUNTER — Other Ambulatory Visit: Payer: Self-pay | Admitting: Nurse Practitioner

## 2023-12-02 MED ORDER — WEGOVY 0.5 MG/0.5ML ~~LOC~~ SOAJ: 0.5000 mg | SUBCUTANEOUS | 2 refills | Status: DC

## 2023-12-05 ENCOUNTER — Telehealth: Payer: Self-pay | Admitting: Nurse Practitioner

## 2023-12-05 ENCOUNTER — Other Ambulatory Visit (HOSPITAL_COMMUNITY): Payer: Self-pay

## 2023-12-05 NOTE — Telephone Encounter (Signed)
 Name from pharmacy: WEGOVY 0.5 MG/0.5 ML PEN  Pharmacy comment: Alternative Requested:ASKING FOR A PA.

## 2023-12-08 ENCOUNTER — Other Ambulatory Visit (HOSPITAL_COMMUNITY): Payer: Self-pay

## 2023-12-08 ENCOUNTER — Telehealth: Payer: Self-pay

## 2023-12-08 NOTE — Telephone Encounter (Signed)
 Pharmacy Patient Advocate Encounter   Received notification from Pt Calls Messages that prior authorization for [facility] 0.5MG  is required/requested.   Insurance verification completed.   The patient is insured through [facility] MEDICAID .   Per test claim: Refill too soon. PA is not needed at this time. Medication was filled 11/21/23. Next eligible fill date is 12/19/23.      PA APPROVED from 10/15/23 to 04/12/24.

## 2023-12-08 NOTE — Telephone Encounter (Signed)
 Per test claim: Wegovy 0.25mg  was filled on 11/21/23 for 1 month supply. Insurance will not allow another fill this soon.  PA originally APPROVED from 10/15/23 to 04/12/24.

## 2023-12-15 ENCOUNTER — Other Ambulatory Visit (HOSPITAL_COMMUNITY): Payer: Self-pay

## 2023-12-15 ENCOUNTER — Telehealth: Payer: Self-pay

## 2023-12-15 ENCOUNTER — Telehealth: Payer: Self-pay | Admitting: Nurse Practitioner

## 2023-12-15 ENCOUNTER — Other Ambulatory Visit: Payer: Self-pay | Admitting: Nurse Practitioner

## 2023-12-15 MED ORDER — WEGOVY 1 MG/0.5ML ~~LOC~~ SOAJ: 1.0000 mg | SUBCUTANEOUS | 2 refills | Status: DC

## 2023-12-15 NOTE — Telephone Encounter (Signed)
 Duplicate. Please see telephone encounters on 12/08/23 and 12/15/23.

## 2023-12-15 NOTE — Telephone Encounter (Signed)
 Called and spoke with patients mother and advised that insurance will only pay month to month even with a dose change. Advised that she should try and fill on April 4. Patients mother verbalized understanding

## 2023-12-15 NOTE — Telephone Encounter (Signed)
 Please see telephone encounter on 12/08/23.  Patient filled 0.25MG  on 11/21/23. Refill is too soon. Insurance will only pay for 1 dose at a time. Patient will be due on 12/19/23. PA is valid through 04/12/24.

## 2023-12-15 NOTE — Telephone Encounter (Signed)
 Patients dose was increased to 1mg . Can a PA be done on this new dose?

## 2023-12-15 NOTE — Telephone Encounter (Signed)
 Name from pharmacy: WEGOVY 1 MG/0.5 ML PEN  Pharmacy comment: Alternative Requested:PRIOR AUTH REQUIRED.

## 2023-12-15 NOTE — Progress Notes (Signed)
 Meds ordered this encounter  Medications   Semaglutide-Weight Management (WEGOVY) 1 MG/0.5ML SOAJ    Sig: Inject 1 mg into the skin once a week.    Dispense:  2 mL    Refill:  2    Supervising Provider:   Arville Care A [[id number]]   Mary-Margaret Daphine Deutscher, FNP

## 2023-12-23 ENCOUNTER — Other Ambulatory Visit: Payer: Self-pay | Admitting: Nurse Practitioner

## 2023-12-23 MED ORDER — WEGOVY 0.5 MG/0.5ML ~~LOC~~ SOAJ: 0.5000 mg | SUBCUTANEOUS | 3 refills | Status: DC

## 2023-12-26 ENCOUNTER — Telehealth: Payer: Self-pay

## 2023-12-26 ENCOUNTER — Other Ambulatory Visit (HOSPITAL_COMMUNITY): Payer: Self-pay

## 2023-12-26 NOTE — Telephone Encounter (Signed)
 Pharmacy Patient Advocate Encounter   Received notification from CoverMyMeds that prior authorization for Wegovy 0.5MG /0.5ML auto-injectors  is required/requested.   Insurance verification completed.   The patient is insured through [facility] MEDICAID .   Per test claim: PA required; PA submitted to above mentioned insurance via Fax Key/confirmation #/EOC --- Status is pending    FAX: [phone] PHONE: [phone]

## 2023-12-26 NOTE — Telephone Encounter (Signed)
 Pharmacy Patient Advocate Encounter   Received notification from CoverMyMeds that prior authorization for [facility] 0.5MG /0.5ML auto-injectors is required/requested.   Insurance verification completed.   The patient is insured through [facility] MEDICAID .    Called [employer] at [phone]. Per representative, a new PA is required for each strength. Current weight is required for new PA.

## 2023-12-26 NOTE — Telephone Encounter (Signed)
 Patients current weight is 225lbs

## 2023-12-29 ENCOUNTER — Other Ambulatory Visit (HOSPITAL_COMMUNITY): Payer: Self-pay

## 2023-12-29 NOTE — Telephone Encounter (Signed)
 Pharmacy Patient Advocate Encounter  Received notification from [place] MEDICAID that Prior Authorization for Wegovy 0.5mg /0.37ml has been APPROVED from 12/26/23 to 06/23/24   PA #/Case ID/Reference #: [id number]  Call reference #[id number]

## 2024-01-13 ENCOUNTER — Ambulatory Visit (INDEPENDENT_AMBULATORY_CARE_PROVIDER_SITE_OTHER): Payer: 59 | Admitting: Podiatry

## 2024-01-13 ENCOUNTER — Encounter: Payer: Self-pay | Admitting: Podiatry

## 2024-01-13 DIAGNOSIS — B351 Tinea unguium: Secondary | ICD-10-CM | POA: Diagnosis not present

## 2024-01-13 DIAGNOSIS — M2041 Other hammer toe(s) (acquired), right foot: Secondary | ICD-10-CM

## 2024-01-13 DIAGNOSIS — M79674 Pain in right toe(s): Secondary | ICD-10-CM

## 2024-01-13 DIAGNOSIS — M2042 Other hammer toe(s) (acquired), left foot: Secondary | ICD-10-CM

## 2024-01-13 NOTE — Patient Instructions (Signed)
 You can use a UREA based cream to help with the calluses

## 2024-01-14 NOTE — Progress Notes (Signed)
 Subjective: Chief Complaint  Patient presents with   RFC    RM#28 RFC    43 year old female presents with her mother for concerns of thick elongated toenails that she is not able to trim her self and she also has calluses that cause discomfort.  No open lesions or other concerns.  Objective: AAO x3, NAD DP/PT pulses palpable bilaterally, CRT less than 3 seconds Nails to be hypertrophic, dystrophic yellow discoloration.  No edema, erythema or signs of infection.  Tenderness with nails 1-5 bilaterally. Hyperkeratotic lesions medial hallux as well as submetatarsal 5 bilaterally without any underlying ulceration, drainage or signs of infection.  No open lesions identified.  There is prominent metatarsal heads plantarly. Digital contractures present. No pain with calf compression, swelling, warmth, erythema  Assessment: Symptomatic onychomycosis, hyperkeratotic lesions  Plan: -All treatment options discussed with the patient including all alternatives, risks, complications.  -Sharply debrided nails x 10 without any complications or bleeding.  Continue topical medication -I do think calluses are getting worse as her digital contractures been worsening.  Discussed shoes, good support as well as offloading.  As courtesy debrided the calluses without any complications or bleeding.  We discussed urea-based cream to help with the calluses -Patient encouraged to call the office with any questions, concerns, change in symptoms.   Charity Conch DPM

## 2024-01-23 ENCOUNTER — Telehealth: Payer: Self-pay

## 2024-01-23 ENCOUNTER — Telehealth: Payer: Self-pay | Admitting: Nurse Practitioner

## 2024-01-23 ENCOUNTER — Other Ambulatory Visit (HOSPITAL_COMMUNITY): Payer: Self-pay

## 2024-01-23 NOTE — Telephone Encounter (Signed)
 Per test claim: The current 28 day co-pay is, $4.  No PA needed at this time. Called pharmacy, they received a paid claim.

## 2024-01-23 NOTE — Telephone Encounter (Signed)
Name from pharmacy: WEGOVY 0.5 MG/0.5 ML PEN ?Pharmacy comment: Alternative Requested:PRIOR AUTH. ?

## 2024-01-23 NOTE — Telephone Encounter (Signed)
 Pharmacy Patient Advocate Encounter   Received notification from Pt Calls Messages that prior authorization for WEGOVY  0.5MG  is required/requested.   Insurance verification completed.   The patient is insured through [facility] MEDICAID .   Per test claim: The current 28 day co-pay is, $4.  No PA needed at this time. This test claim was processed through [facility]- copay amounts may vary at other pharmacies due to pharmacy/plan contracts, or as the patient moves through the different stages of their insurance plan.       Called pharmacy, they received a paid claim.

## 2024-02-12 ENCOUNTER — Encounter: Payer: Self-pay | Admitting: Nurse Practitioner

## 2024-02-12 ENCOUNTER — Ambulatory Visit: Admitting: Nurse Practitioner

## 2024-02-12 VITALS — BP 135/97 | HR 84 | Temp 97.1°F | Ht 63.0 in | Wt 230.0 lb

## 2024-02-12 DIAGNOSIS — Z6841 Body Mass Index (BMI) 40.0 and over, adult: Secondary | ICD-10-CM

## 2024-02-12 MED ORDER — WEGOVY 0.5 MG/0.5ML ~~LOC~~ SOAJ: 0.5000 mg | SUBCUTANEOUS | 2 refills | Status: DC

## 2024-02-12 MED ORDER — WEGOVY 1 MG/0.5ML ~~LOC~~ SOAJ
1.0000 mg | SUBCUTANEOUS | 3 refills | Status: DC
Start: 2024-02-12 — End: 2024-02-12

## 2024-02-12 NOTE — Progress Notes (Signed)
   Subjective:    Patient ID: Ashlee Hoffman, female    DOB: 1981/06/26, 43 y.o.   MRN: [id number]  Mom brings patient in for weight check. She has been on wegovy  for awhile. Is doing well. Has helped decrease appetite. Weight is unchanged. The wegovy  0.5mg  caused diarrhea.   Wt Readings from Last 3 Encounters:  02/12/24 230 lb (104.3 kg)  10/14/23 227 lb (103 kg)  10/13/23 234 lb (106.1 kg)   BMI Readings from Last 3 Encounters:  02/12/24 40.74 kg/m  10/14/23 40.21 kg/m  10/13/23 41.45 kg/m        Review of Systems  Constitutional:  Negative for diaphoresis.  Eyes:  Negative for pain.  Respiratory:  Negative for shortness of breath.   Cardiovascular:  Negative for chest pain, palpitations and leg swelling.  Gastrointestinal:  Negative for abdominal pain.  Endocrine: Negative for polydipsia.  Skin:  Negative for rash.  Neurological:  Negative for dizziness, weakness and headaches.  Hematological:  Does not bruise/bleed easily.  All other systems reviewed and are negative.      Objective:   Physical Exam Constitutional:      Appearance: Normal appearance. She is obese.  Cardiovascular:     Rate and Rhythm: Normal rate and regular rhythm.     Heart sounds: Normal heart sounds.  Pulmonary:     Effort: Pulmonary effort is normal.     Breath sounds: Normal breath sounds.  Skin:    General: Skin is warm.  Neurological:     General: No focal deficit present.     Mental Status: She is alert and oriented to person, place, and time.  Psychiatric:        Mood and Affect: Mood normal.        Behavior: Behavior normal.     BP (!) 135/97   Pulse 84   Temp (!) 97.1 F (36.2 C) (Temporal)   Ht 5\' 3"  (1.6 m)   Wt 230 lb (104.3 kg)   SpO2 100%   BMI 40.74 kg/m        Assessment & Plan:  Ashlee Hoffman in today with chief complaint of Discuss wegovy  (Making her sick since she increased the dose/)   1. Body mass index (BMI) 40.0-44.9, adult (HCC) (Primary) Stay at  0.5mg  dose Take imodium to help counter act the diarrhea symptom. - Semaglutide -Weight Management (WEGOVY ) 0.5 MG/0.5ML SOAJ; Inject 0.5 mg into the skin once a week.  Dispense: 2 mL; Refill: 2    The above assessment and management plan was discussed with the patient. The patient verbalized understanding of and has agreed to the management plan. Patient is aware to call the clinic if symptoms persist or worsen. Patient is aware when to return to the clinic for a follow-up visit. Patient educated on when it is appropriate to go to the emergency department.   Mary-Margaret Gaylyn Keas, FNP

## 2024-03-01 DIAGNOSIS — M25511 Pain in right shoulder: Secondary | ICD-10-CM | POA: Diagnosis not present

## 2024-03-09 ENCOUNTER — Encounter: Payer: Self-pay | Admitting: Nurse Practitioner

## 2024-03-09 ENCOUNTER — Ambulatory Visit (INDEPENDENT_AMBULATORY_CARE_PROVIDER_SITE_OTHER): Admitting: Nurse Practitioner

## 2024-03-09 ENCOUNTER — Telehealth: Payer: Self-pay

## 2024-03-09 ENCOUNTER — Other Ambulatory Visit (HOSPITAL_COMMUNITY): Payer: Self-pay

## 2024-03-09 DIAGNOSIS — Z6841 Body Mass Index (BMI) 40.0 and over, adult: Secondary | ICD-10-CM | POA: Diagnosis not present

## 2024-03-09 DIAGNOSIS — Z0001 Encounter for general adult medical examination with abnormal findings: Secondary | ICD-10-CM

## 2024-03-09 DIAGNOSIS — Z309 Encounter for contraceptive management, unspecified: Secondary | ICD-10-CM

## 2024-03-09 MED ORDER — TIRZEPATIDE-WEIGHT MANAGEMENT 2.5 MG/0.5ML ~~LOC~~ SOLN: 2.5000 mg | SUBCUTANEOUS | 1 refills | Status: DC

## 2024-03-09 MED ORDER — MEDROXYPROGESTERONE ACETATE 10 MG PO TABS: 10.0000 mg | ORAL_TABLET | Freq: Every day | ORAL | 1 refills | Status: DC

## 2024-03-09 NOTE — Progress Notes (Signed)
 Subjective:    Patient ID: Ashlee Hoffman, female    DOB: [birth year], 43 y.o.   MRN: [id number]   Chief Complaint: annual physical (no PAP)  HPI:  Ashlee Hoffman is a 43 y.o. who identifies as a female who was assigned female at birth.   Social history: Lives with: parents Work history: disabled   Comes in today for follow up of the following chronic medical issues:  1. Mentally disabled She lives with her mom and step dad; seldomly left home alone. She is able to perform ADLs for self care independently. Very pleasant and interactive throughout visit.  2. Pure hypercholesterolemia On no medications. Does not particularly watch her diet. Lab Results  Component Value Date   CHOL 173 10/14/2023   HDL 44 10/14/2023   LDLCALC 109 (H) 10/14/2023   TRIG 113 10/14/2023   CHOLHDL 3.9 10/14/2023     3. Body mass index (BMI) 40.0-44.9, adult (HCC) Weight is unchanged. Has been on wegovy  with no success  Wt Readings from Last 3 Encounters:  03/09/24 230 lb (104.3 kg)  02/12/24 230 lb (104.3 kg)  10/14/23 227 lb (103 kg)   BMI Readings from Last 3 Encounters:  03/09/24 40.74 kg/m  02/12/24 40.74 kg/m  10/14/23 40.21 kg/m       New complaints: None today  Allergies  Allergen Reactions   Septra [Sulfamethoxazole-Trimethoprim] Itching   Sulfamethoxazole    Trimethoprim    Outpatient Encounter Medications as of 03/09/2024  Medication Sig   Acetaminophen  (TYLENOL  ARTHRITIS EXT RELIEF PO) Take 1-2 tablets by mouth as needed. Reported on 01/12/2016   cetirizine  (ZYRTEC ) 10 MG tablet Take 1 tablet (10 mg total) by mouth daily.   diclofenac  Sodium (VOLTAREN ) 1 % GEL Apply 2 g topically 4 (four) times daily. Rub into affected area of foot 2 to 4 times daily   medroxyPROGESTERone  (PROVERA ) 10 MG tablet Take 1 tablet (10 mg total) by mouth daily.   meloxicam  (MOBIC ) 15 MG tablet TAKE 1 TABLET (15 MG TOTAL) BY MOUTH DAILY.   Semaglutide -Weight Management (WEGOVY ) 0.5 MG/0.5ML  SOAJ Inject 0.5 mg into the skin once a week.   No facility-administered encounter medications on file as of 03/09/2024.    Past Surgical History:  Procedure Laterality Date   LUMBAR LAMINECTOMY/DECOMPRESSION MICRODISCECTOMY N/A 05/24/2013   Procedure: LUMBAR LAMINECTOMY/DECOMPRESSION MICRODISCECTOMY 1 LEVEL LEFT LUMBAR FIVE-SACARAL-ONE;  Surgeon: Victory DELENA Gunnels, MD;  Location: [facility];  Service: Neurosurgery;  Laterality: N/A;  left    Family History  Problem Relation Age of Onset   Hypertension Mother    Arthritis Mother    Diabetes Maternal Grandfather    Hypertension Maternal Grandfather    Heart disease Maternal Grandfather    Breast cancer Neg Hx       Controlled substance contract: n/a     Review of Systems  Constitutional:  Negative for activity change and fever.  HENT:  Negative for congestion.   Eyes:  Negative for visual disturbance.  Respiratory:  Negative for chest tightness and shortness of breath.   Cardiovascular:  Negative for chest pain and leg swelling.  Gastrointestinal:  Negative for abdominal pain.  Endocrine: Negative for polydipsia and polyuria.  Genitourinary:  Negative for difficulty urinating.  Neurological:  Negative for weakness and headaches.  All other systems reviewed and are negative.      Objective:   Physical Exam Vitals and nursing note reviewed.  Constitutional:      General: She is not in acute distress.  Appearance: Normal appearance. She is well-groomed.  HENT:     Head: Normocephalic and atraumatic.     Right Ear: Tympanic membrane normal.     Left Ear: Tympanic membrane normal.     Nose: Nose normal.     Mouth/Throat:     Mouth: Mucous membranes are moist.     Pharynx: Oropharynx is clear.   Eyes:     Conjunctiva/sclera: Conjunctivae normal.     Pupils: Pupils are equal, round, and reactive to light.    Cardiovascular:     Rate and Rhythm: Normal rate and regular rhythm.     Pulses: Normal pulses.     Heart  sounds: Normal heart sounds.  Pulmonary:     Effort: Pulmonary effort is normal. No respiratory distress.     Breath sounds: Normal breath sounds. No wheezing, rhonchi or rales.  Abdominal:     General: Bowel sounds are normal.     Palpations: Abdomen is soft.     Tenderness: There is no abdominal tenderness.   Musculoskeletal:        General: Normal range of motion.     Right lower leg: No edema.     Left lower leg: No edema.  Lymphadenopathy:     Cervical: No cervical adenopathy.   Skin:    General: Skin is warm and dry.     Capillary Refill: Capillary refill takes less than 2 seconds.   Neurological:     General: No focal deficit present.     Mental Status: She is alert and oriented to person, place, and time.   Psychiatric:        Mood and Affect: Mood normal.        Behavior: Behavior normal. Behavior is cooperative.       BP 113/81   Pulse 77   Temp 97.6 F (36.4 C) (Temporal)   Ht 5' 3 (1.6 m)   Wt 230 lb (104.3 kg)   SpO2 99%   BMI 40.74 kg/m       Assessment & Plan:  Ashlee Hoffman comes in today with chief complaint of No chief complaint on file.   Diagnosis and orders addressed:  1. Mentally disabled Lives with her parents  2. Pure hypercholesterolemia Low fat diet - CBC with Differential/Platelet - CMP14+EGFR - Lipid panel  3. Body mass index (BMI) 40.0-44.9, adult (HCC) Discussed diet and exercise for person with BMI >25 Will recheck weight in 3-6 months Stop wegovy  Zepbound prescription sent to pharmacy  Meds ordered this encounter  Medications   medroxyPROGESTERone  (PROVERA ) 10 MG tablet    Sig: Take 1 tablet (10 mg total) by mouth daily.    Dispense:  90 tablet    Refill:  1    Supervising Provider:   MARYANNE CHEW A [[id number]]   tirzepatide (ZEPBOUND) 2.5 MG/0.5ML injection vial    Sig: Inject 2.5 mg into the skin once a week.    Dispense:  2 mL    Refill:  1    Supervising Provider:   MARYANNE CHEW A [id number]       Labs pending Health Maintenance reviewed Diet and exercise encouraged  Follow up plan: 2 months and prn   Mary-Margaret Gladis, FNP

## 2024-03-09 NOTE — Telephone Encounter (Signed)
 Pharmacy Patient Advocate Encounter   Received notification from CoverMyMeds that prior authorization for Zepbound is required/requested.   Insurance verification completed.   The patient is insured through [facility] MEDICAID .   Per test claim: PA required; PA submitted to above mentioned insurance via CoverMyMeds Key/confirmation #/EOC [id number] Status is pending

## 2024-03-09 NOTE — Patient Instructions (Signed)
 Tirzepatide Injection (Weight Management) What is this medication? TIRZEPATIDE (tir ZEP a tide) promotes weight loss. It may also be used to maintain weight loss.  It works by decreasing appetite. It can be used to treat sleep apnea. Changes to diet and exercise are often combined with this medication. This medicine may be used for other purposes; ask your health care provider or pharmacist if you have questions. COMMON BRAND NAME(S): Zepbound What should I tell my care team before I take this medication? They need to know if you have any of these conditions: Diabetes Eye disease caused by diabetes Gallbladder disease Have or have had depression Have or have had pancreatitis Having surgery Kidney disease Personal or family history of MEN 2, a condition that causes endocrine gland tumors Personal or family history of thyroid  cancer Stomach or intestine problems, such as problems digesting food Suicidal thoughts, plans, or attempt An unusual or allergic reaction to tirzepatide, other medications, foods, dyes, or preservatives Pregnant or trying to get pregnant Breastfeeding How should I use this medication? This medication is injected under the skin. You will be taught how to prepare and give it. Take it as directed on the prescription label. Keep taking it unless your care team tells you to stop. It is important that you put your used needles and syringes in a special sharps container. Do not put them in a trash can. If you do not have a sharps container, call your pharmacist or care team to get one. A special MedGuide will be given to you by the pharmacist with each prescription and refill. Be sure to read this information carefully each time. This medication comes with INSTRUCTIONS FOR USE. Ask your pharmacist for directions on how to use this medication. Read the information carefully. Talk to your pharmacist or care team if you have questions. Talk to your care team about the use of this  medication in children. Special care may be needed. Overdosage: If you think you have taken too much of this medicine contact a poison control center or emergency room at once. NOTE: This medicine is only for you. Do not share this medicine with others. What if I miss a dose? If you miss a dose, take it as soon as you can unless it is more than 4 days (96 hours) late. If it is more than 4 days late, skip the missed dose. Take the next dose at the normal time. Do not take 2 doses within 3 days (72 hours) of each other. What may interact with this medication? Certain medications for diabetes, such as insulin , glyburide, glipizide This medication may affect how other medications work. Talk with your care team about all of the medications you take. They may suggest changes to your treatment plan to lower the risk of side effects and to make sure your medications work as intended. This list may not describe all possible interactions. Give your health care provider a list of all the medicines, herbs, non-prescription drugs, or dietary supplements you use. Also tell them if you smoke, drink alcohol, or use illegal drugs. Some items may interact with your medicine. What should I watch for while using this medication? Visit your care team for regular checks on your progress. Tell your care team if your condition does not start to get better or if it gets worse. Tell your care team if you are taking medication to treat diabetes, such as insulin  or glipizide. This may increase your risk of low blood sugar. Know the  symptoms of low blood sugar and how to treat it. Talk to your care team about your risk of cancer. You may be more at risk for certain types of cancer if you take this medication. Talk to your care team right away if you have a lump or swelling in your neck, hoarseness that does not go away, trouble swallowing, shortness of breath, or trouble breathing. Make sure you stay hydrated while taking this  medication. Drink water often. Eat fruits and veggies that have a high water content. Drink more water when it is hot or you are active. Talk to your care team right away if you have fever, infection, vomiting, diarrhea, or if you sweat a lot while taking this medication. The loss of too much body fluid may make it dangerous for you to take this medication. If you are going to need surgery or a procedure, tell your care team that you are taking this medication. Estrogen and progestin hormones that you take by mouth may not work as well while you are taking this medication. Switch to a non-oral contraceptive or add a barrier contraceptive for 4 weeks after starting this medication and after each dose increase. Talk to your care team about contraceptive options. They can help you find the option that works for you. Do not take this medication without first talking to your care team if you may be or could become pregnant. Your care team can help you find the option that works for you. Weight loss is not recommended during pregnancy. Talk to your care team if you are breastfeeding. When recommended, this medication may be taken. Its use during breastfeeding has not been well studied. Your care team may suggest other options. What side effects may I notice from receiving this medication? Side effects that you should report to your care team as soon as possible: Allergic reactions--skin rash, itching, hives, swelling of the face, lips, tongue, or throat Change in vision Dehydration--increased thirst, dry mouth, feeling faint or lightheaded, headache, dark yellow or brown urine Fast or irregular heartbeat Gallbladder problems--severe stomach pain, nausea, vomiting, fever Kidney injury--decrease in the amount of urine, swelling of the ankles, hands, or feet Pancreatitis--severe stomach pain that spreads to your back or gets worse after eating or when touched, fever, nausea, vomiting Thoughts of suicide or  self-harm, worsening mood, feelings of depression Thyroid  cancer--new mass or lump in the neck, pain or trouble swallowing, trouble breathing, hoarseness Side effects that usually do not require medical attention (report these to your care team if they continue or are bothersome): Constipation Diarrhea Loss of appetite Nausea Upset stomach This list may not describe all possible side effects. Call your doctor for medical advice about side effects. You may report side effects to FDA at 1-800-FDA-1088. Where should I keep my medication? Keep out of the reach of children and pets. Store in a refrigerator or at room temperature up to 30 degrees C (86 degrees F). Keep it in the original container. Protect from light. Refrigeration (preferred): Store in the refrigerator. Do not freeze. Get rid of any unused medication after the expiration date. Room temperature: This medication may be stored at room temperature for up to 21 days. If it is stored at room temperature, get rid of any unused medication after 21 days or after it expires, whichever is first. To get rid of medications that are no longer needed or have expired: Take the medication to a medication take-back program. Check with your pharmacy or law enforcement  to find a location. If you cannot return the medication, ask your pharmacist or care team how to get rid of this medication safely. NOTE: This sheet is a summary. It may not cover all possible information. If you have questions about this medicine, talk to your doctor, pharmacist, or health care provider.  2025 Elsevier/Gold Standard (2023-09-30 00:00:00)

## 2024-03-11 ENCOUNTER — Other Ambulatory Visit (HOSPITAL_COMMUNITY): Payer: Self-pay

## 2024-03-15 ENCOUNTER — Ambulatory Visit (INDEPENDENT_AMBULATORY_CARE_PROVIDER_SITE_OTHER): Admitting: Podiatry

## 2024-03-15 ENCOUNTER — Other Ambulatory Visit (HOSPITAL_COMMUNITY): Payer: Self-pay

## 2024-03-15 ENCOUNTER — Encounter: Payer: Self-pay | Admitting: Podiatry

## 2024-03-15 DIAGNOSIS — B351 Tinea unguium: Secondary | ICD-10-CM

## 2024-03-15 DIAGNOSIS — M79674 Pain in right toe(s): Secondary | ICD-10-CM

## 2024-03-15 NOTE — Progress Notes (Signed)
 Subjective: Chief Complaint  Patient presents with   RFC    RM#13 RFC/CALLUSES    43 year old female presents with her mother for concerns of thick elongated toenails that she is not able to trim her self and she also has calluses that cause discomfort. She has been getting a new spot on the side of the right 3rd toe, callus, which is causing pain. No injuries. No open lesions or other concerns.  Objective: AAO x3, NAD DP/PT pulses palpable bilaterally, CRT less than 3 seconds Nails to be hypertrophic, dystrophic yellow discoloration.  No edema, erythema or signs of infection.  Tenderness with nails 1-5 bilaterally. Hyperkeratotic lesions medial hallux but also new lesion present lateral aspect of right third toe without any underlying ulceration, drainage or signs of infection.  Digital contractures present. No pain with calf compression, swelling, warmth, erythema  Assessment: Symptomatic onychomycosis, hyperkeratotic lesions  Plan: -All treatment options discussed with the patient including all alternatives, risks, complications.  -Sharply debrided nails x 10 without any complications or bleeding.  Continue topical medication -I do think calluses are getting worse as her digital contractures been worsening.  Discussed shoes, good support as well as offloading.  Sharply debrided the hyperkeratotic lesions without any complications or bleeding. -Patient encouraged to call the office with any questions, concerns, change in symptoms.   Ashlee Hoffman DPM

## 2024-03-29 NOTE — Telephone Encounter (Signed)
 Any update on PA?

## 2024-03-30 ENCOUNTER — Other Ambulatory Visit (HOSPITAL_COMMUNITY): Payer: Self-pay

## 2024-03-30 NOTE — Telephone Encounter (Signed)
 She was unable to take Wegovy  due to making her violently sick. Switched to Zepbound  to see if that would work better. Please continue with PA for Zepbound 

## 2024-03-30 NOTE — Telephone Encounter (Signed)
 Ive tried calling [facility] both last Monday and today with no luck. It seems she has an open PA approved for Wegovy . That approval is from 01/23/24 and the co-pay is $4

## 2024-03-31 ENCOUNTER — Other Ambulatory Visit (HOSPITAL_COMMUNITY): Payer: Self-pay

## 2024-04-01 ENCOUNTER — Other Ambulatory Visit (HOSPITAL_COMMUNITY): Payer: Self-pay

## 2024-04-01 MED ORDER — TIRZEPATIDE-WEIGHT MANAGEMENT 2.5 MG/0.5ML ~~LOC~~ SOLN
2.5000 mg | SUBCUTANEOUS | 2 refills | Status: DC
Start: 2024-04-01 — End: 2024-06-10

## 2024-04-01 NOTE — Telephone Encounter (Signed)
 Can this rx be corrected and message sent back to PA pool?

## 2024-04-01 NOTE — Telephone Encounter (Signed)
 Script is written for vials which are not covered under any circumstance due to them being compounded products.

## 2024-04-01 NOTE — Addendum Note (Signed)
 Addended by: Sabrea Sankey, MARY-MARGARET on: 04/01/2024 04:02 PM   Modules accepted: Orders

## 2024-04-04 ENCOUNTER — Telehealth: Payer: Self-pay | Admitting: Nurse Practitioner

## 2024-04-05 NOTE — Telephone Encounter (Signed)
 ZEPBOUND  2.5 MG/0.5ML Pen  Pharmacy comment: Alternative Requested:PRIOR AUTH IS REQUIRED.

## 2024-04-06 ENCOUNTER — Other Ambulatory Visit (HOSPITAL_COMMUNITY): Payer: Self-pay

## 2024-04-06 ENCOUNTER — Telehealth: Payer: Self-pay

## 2024-04-06 NOTE — Telephone Encounter (Signed)
 Pharmacy Patient Advocate Encounter   Received notification from Physician's Office that prior authorization for Zepbound  2.5 is required/requested.   Insurance verification completed.   The patient is insured through [employer] .   Per test claim: PA required; PA submitted to above mentioned insurance via CoverMyMeds Key/confirmation #/EOC [id number] Status is pending

## 2024-04-07 ENCOUNTER — Encounter: Payer: Self-pay | Admitting: Nurse Practitioner

## 2024-04-07 NOTE — Telephone Encounter (Signed)
 Can we follow up PA status? PCP and patient asking Feel free to route response directly to me Thanks!

## 2024-04-13 ENCOUNTER — Ambulatory Visit: Payer: 59 | Admitting: Nurse Practitioner

## 2024-04-14 ENCOUNTER — Other Ambulatory Visit (HOSPITAL_COMMUNITY): Payer: Self-pay

## 2024-04-14 NOTE — Telephone Encounter (Signed)
 Pharmacy Patient Advocate Encounter  Received notification from [facility] at [facility] [phone] that Prior Authorization for Zepbound  2.5 has been APPROVED from 04/07/24 to 07/06/24. Ran test claim, Copay is $4.00. This test claim was processed through [facility]- copay amounts may vary at other pharmacies due to pharmacy/plan contracts, or as the patient moves through the different stages of their insurance plan.

## 2024-04-15 NOTE — Telephone Encounter (Signed)
 Please let mom know she can get prescription from pharmacy

## 2024-04-15 NOTE — Telephone Encounter (Signed)
Mom aware and verbalizes understanding.  

## 2024-04-29 ENCOUNTER — Ambulatory Visit (INDEPENDENT_AMBULATORY_CARE_PROVIDER_SITE_OTHER): Admitting: Podiatry

## 2024-04-29 ENCOUNTER — Encounter: Payer: Self-pay | Admitting: Podiatry

## 2024-04-29 VITALS — Ht 63.0 in | Wt 230.0 lb

## 2024-04-29 DIAGNOSIS — M7751 Other enthesopathy of right foot: Secondary | ICD-10-CM

## 2024-04-29 MED ORDER — TRIAMCINOLONE ACETONIDE 10 MG/ML IJ SUSP
5.0000 mg | Freq: Once | INTRAMUSCULAR | Status: AC
  Administered 2024-04-29: 5 mg via INTRA_ARTICULAR

## 2024-04-29 NOTE — Patient Instructions (Signed)

## 2024-05-01 NOTE — Progress Notes (Signed)
 Subjective: Chief Complaint  Patient presents with   Ankle Pain    Pt is here due to right ankle pain, mom states that it is the same issue she's had before, is wearing a brace to help with pain which is helping.   43 year old female presents the office with her mom for concerns of recurrent pain of the right ankle.  Since I saw her last she has been complaining more of ankle hurting and she started wearing the brace which does help some.  No injuries that they report.  No changes otherwise.  She states it feels similar to what she had previously.  Objective: AAO x3, NAD DP/PT pulses palpable bilaterally, CRT less than 3 seconds I am not able to appreciate any area pinpoint tenderness.  Ankle, subtalar range of motion intact.  She does get tenderness mostly noted along the sinus tarsi laterally on the right foot.  Some slight edema is present but there is no erythema.  There is no other areas of discomfort.  MMT 5/5. No pain with calf compression, swelling, warmth, erythema  Assessment: Capsulitis right ankle  Plan: -All treatment options discussed with the patient including all alternatives, risks, complications.  -We discussed the rejection she wishes to proceed with this and the mom agrees.  I cleaned the skin with Betadine, alcohol.  Mixture of 1 cc Kenalog  10, 0.5 cc of Marcaine  plain, 0.5 cc of lidocaine  plain was infiltrated into the sinus tarsi without complications.  Postinjection care discussed.  Tolerated well. -Continue ankle brace for now. -If symptoms persist repeat x-ray -Patient encouraged to call the office with any questions, concerns, change in symptoms.   No follow-ups on file.  Donnice JONELLE Fees DPM

## 2024-05-10 ENCOUNTER — Ambulatory Visit: Admitting: Nurse Practitioner

## 2024-05-24 ENCOUNTER — Ambulatory Visit: Admitting: Podiatry

## 2024-06-08 ENCOUNTER — Ambulatory Visit (INDEPENDENT_AMBULATORY_CARE_PROVIDER_SITE_OTHER): Admitting: Podiatry

## 2024-06-08 VITALS — Ht 63.0 in | Wt 230.0 lb

## 2024-06-08 DIAGNOSIS — M79674 Pain in right toe(s): Secondary | ICD-10-CM

## 2024-06-08 DIAGNOSIS — R61 Generalized hyperhidrosis: Secondary | ICD-10-CM

## 2024-06-08 DIAGNOSIS — B351 Tinea unguium: Secondary | ICD-10-CM | POA: Diagnosis not present

## 2024-06-08 DIAGNOSIS — M7751 Other enthesopathy of right foot: Secondary | ICD-10-CM

## 2024-06-08 MED ORDER — DRYSOL 20 % EX SOLN: Freq: Every day | CUTANEOUS | 2 refills | Status: AC

## 2024-06-08 NOTE — Progress Notes (Signed)
 Subjective: Chief Complaint  Patient presents with   Nail Problem    Rm 11 RFC/nail trim.    43 year old female presents with her mother for concerns of thick elongated toenails that she is not able to trim her self and she also has calluses that cause discomfort.   She also gets cracks between her toes which cause pain.  She states that her feet do sweat excessively.  She has tried different types of socks and shoes and powders that significant improvement.  Also states that the injection has been helpful.  Still some ankle pain but not as symptomatic as it was previously.  Objective: AAO x3, NAD DP/PT pulses palpable bilaterally, CRT less than 3 seconds Nails to be hypertrophic, dystrophic yellow discoloration.  No edema, erythema or signs of infection.  Tenderness with nails 1-5 bilaterally. Hyperkeratotic lesions medial hallux but also new lesion present lateral aspect of right third toe without any underlying ulceration, drainage or signs of infection.  Digital contractures present. There is macerated tissue on the fourth interspaces bilaterally superficial area of skin breakdown.  There is no drainage or pus or any open lesions. There is no area pinpoint tenderness.  Not able to appreciate any pain to the ankle or subtalar joint on the right side today.  No edema, erythema. No pain with calf compression, swelling, warmth, erythema  Assessment: Symptomatic onychomycosis, hyperkeratotic lesions  Plan: Symptomatic onychosis -Sharply debrided nails x 10 without any complications or bleeding.  Continue topical medication  Hyperkeratotic lesions -As a courtesy I debrided the any complications or bleeding.  They were asked not urea cream today which I think would be helpful but still needs to keep on shoes, good arch support to avoid pressure.  Do not apply urea interdigitally  Hyperhidrosis -Discussed Drysol which I ordered today.  Continue changing shoes and socks regular.   Discussed antiperspirant spray.  Skin maceration - With moisture control be beneficial.  Also continue dry thoroughly between the toes.

## 2024-06-10 ENCOUNTER — Encounter: Payer: Self-pay | Admitting: Nurse Practitioner

## 2024-06-10 ENCOUNTER — Ambulatory Visit (INDEPENDENT_AMBULATORY_CARE_PROVIDER_SITE_OTHER): Admitting: Nurse Practitioner

## 2024-06-10 VITALS — BP 132/86 | HR 73 | Temp 97.2°F | Ht 63.0 in | Wt 233.0 lb

## 2024-06-10 DIAGNOSIS — Z309 Encounter for contraceptive management, unspecified: Secondary | ICD-10-CM

## 2024-06-10 DIAGNOSIS — E7800 Pure hypercholesterolemia, unspecified: Secondary | ICD-10-CM

## 2024-06-10 DIAGNOSIS — F79 Unspecified intellectual disabilities: Secondary | ICD-10-CM

## 2024-06-10 DIAGNOSIS — L989 Disorder of the skin and subcutaneous tissue, unspecified: Secondary | ICD-10-CM | POA: Diagnosis not present

## 2024-06-10 LAB — LIPID PANEL

## 2024-06-10 MED ORDER — MEDROXYPROGESTERONE ACETATE 10 MG PO TABS: 10.0000 mg | ORAL_TABLET | Freq: Every day | ORAL | 1 refills | Status: DC

## 2024-06-10 NOTE — Progress Notes (Signed)
 Subjective:    Patient ID: Ashlee Hoffman, female    DOB: February 17, 1981, 43 y.o.   MRN: [id number]   Chief Complaint: medical management of chronic issues    HPI:  Ashlee Hoffman is a 43 y.o. who identifies as a female who was assigned female at birth.   Social history: Lives with: parents Work history: disabled   Comes in today for follow up of the following chronic medical issues:  1. Mentally disabled She lives with her mom and step dad; seldomly left home alone. She is able to perform ADLs for self care independently. Very pleasant and interactive throughout visit.  2. Pure hypercholesterolemia On no medications. Does not particularly watch her diet. Lab Results  Component Value Date   CHOL 173 10/14/2023   HDL 44 10/14/2023   LDLCALC 109 (H) 10/14/2023   TRIG 113 10/14/2023   CHOLHDL 3.9 10/14/2023   The 10-year ASCVD risk score (Arnett DK, et al., 2019) is: 0.8%   3. Body mass index (BMI) 40.0-44.9, adult (HCC) Weight is unchanged. Has been on wegovy  with no success  Wt Readings from Last 3 Encounters:  06/10/24 233 lb (105.7 kg)  06/08/24 230 lb (104.3 kg)  04/29/24 230 lb (104.3 kg)   BMI Readings from Last 3 Encounters:  06/10/24 41.27 kg/m  06/08/24 40.74 kg/m  04/29/24 40.74 kg/m       New complaints: None today  Allergies  Allergen Reactions   Septra [Sulfamethoxazole-Trimethoprim] Itching   Sulfamethoxazole    Trimethoprim    Outpatient Encounter Medications as of 06/10/2024  Medication Sig   Acetaminophen  (TYLENOL  ARTHRITIS EXT RELIEF PO) Take 1-2 tablets by mouth as needed. Reported on 01/12/2016   aluminum chloride (DRYSOL) 20 % external solution Apply topically at bedtime.   cetirizine  (ZYRTEC ) 10 MG tablet Take 1 tablet (10 mg total) by mouth daily.   diclofenac  Sodium (VOLTAREN ) 1 % GEL Apply 2 g topically 4 (four) times daily. Rub into affected area of foot 2 to 4 times daily   medroxyPROGESTERone  (PROVERA ) 10 MG tablet Take 1 tablet (10  mg total) by mouth daily.   meloxicam  (MOBIC ) 15 MG tablet TAKE 1 TABLET (15 MG TOTAL) BY MOUTH DAILY.   tirzepatide  (ZEPBOUND ) 2.5 MG/0.5ML injection vial Inject 2.5 mg into the skin once a week.   No facility-administered encounter medications on file as of 06/10/2024.    Past Surgical History:  Procedure Laterality Date   LUMBAR LAMINECTOMY/DECOMPRESSION MICRODISCECTOMY N/A 05/24/2013   Procedure: LUMBAR LAMINECTOMY/DECOMPRESSION MICRODISCECTOMY 1 LEVEL LEFT LUMBAR FIVE-SACARAL-ONE;  Surgeon: Victory DELENA Gunnels, MD;  Location: [facility];  Service: Neurosurgery;  Laterality: N/A;  left    Family History  Problem Relation Age of Onset   Hypertension Mother    Arthritis Mother    Diabetes Maternal Grandfather    Hypertension Maternal Grandfather    Heart disease Maternal Grandfather    Breast cancer Neg Hx       Controlled substance contract: n/a     Review of Systems  Constitutional:  Negative for activity change and fever.  HENT:  Negative for congestion.   Eyes:  Negative for visual disturbance.  Respiratory:  Negative for chest tightness and shortness of breath.   Cardiovascular:  Negative for chest pain and leg swelling.  Gastrointestinal:  Negative for abdominal pain.  Endocrine: Negative for polydipsia and polyuria.  Genitourinary:  Negative for difficulty urinating.  Neurological:  Negative for weakness and headaches.  All other systems reviewed and are negative.  Objective:   Physical Exam Vitals and nursing note reviewed.  Constitutional:      General: She is not in acute distress.    Appearance: Normal appearance. She is well-groomed.  HENT:     Head: Normocephalic and atraumatic.     Right Ear: Tympanic membrane normal.     Left Ear: Tympanic membrane normal.     Nose: Nose normal.     Mouth/Throat:     Mouth: Mucous membranes are moist.     Pharynx: Oropharynx is clear.  Eyes:     Conjunctiva/sclera: Conjunctivae normal.     Pupils: Pupils are  equal, round, and reactive to light.  Cardiovascular:     Rate and Rhythm: Normal rate and regular rhythm.     Pulses: Normal pulses.     Heart sounds: Normal heart sounds.  Pulmonary:     Effort: Pulmonary effort is normal. No respiratory distress.     Breath sounds: Normal breath sounds. No wheezing, rhonchi or rales.  Abdominal:     General: Bowel sounds are normal.     Palpations: Abdomen is soft.     Tenderness: There is no abdominal tenderness.  Musculoskeletal:        General: Normal range of motion.     Right lower leg: No edema.     Left lower leg: No edema.  Lymphadenopathy:     Cervical: No cervical adenopathy.  Skin:    General: Skin is warm and dry.     Capillary Refill: Capillary refill takes less than 2 seconds.  Neurological:     General: No focal deficit present.     Mental Status: She is alert and oriented to person, place, and time.  Psychiatric:        Mood and Affect: Mood normal.        Behavior: Behavior normal. Behavior is cooperative.       BP 132/86   Pulse 73   Temp (!) 97.2 F (36.2 C) (Temporal)   Ht 5' 3 (1.6 m)   Wt 233 lb (105.7 kg)   SpO2 98%   BMI 41.27 kg/m       Assessment & Plan:  Ashlee Hoffman comes in today with chief complaint of Medical Management of Chronic Issues   Diagnosis and orders addressed:  1. Mentally disabled Lives with her parents  2. Pure hypercholesterolemia Low fat diet - CBC with Differential/Platelet - CMP14+EGFR - Lipid panel  3. Body mass index (BMI) 40.0-44.9, adult (HCC) Discussed diet and exercise for person with BMI >25 Will recheck weight in 3-6 months  4. Nose lesion Referral to derm   Labs pending Health Maintenance reviewed Diet and exercise encouraged  Follow up plan: 2 months and prn   Mary-Margaret Gladis, FNP

## 2024-06-10 NOTE — Patient Instructions (Signed)
 Exercising to Stay Healthy To become healthy and stay healthy, it is recommended that you do moderate-intensity and vigorous-intensity exercise. You can tell that you are exercising at a moderate intensity if your heart starts beating faster and you start breathing faster but can still hold a conversation. You can tell that you are exercising at a vigorous intensity if you are breathing much harder and faster and cannot hold a conversation while exercising. How can exercise benefit me? Exercising regularly is important. It has many health benefits, such as: Improving overall fitness, flexibility, and endurance. Increasing bone density. Helping with weight control. Decreasing body fat. Increasing muscle strength and endurance. Reducing stress and tension, anxiety, depression, or anger. Improving overall health. What guidelines should I follow while exercising? Before you start a new exercise program, talk with your health care provider. Do not exercise so much that you hurt yourself, feel dizzy, or get very short of breath. Wear comfortable clothes and wear shoes with good support. Drink plenty of water while you exercise to prevent dehydration or heat stroke. Work out until your breathing and your heartbeat get faster (moderate intensity). How often should I exercise? Choose an activity that you enjoy, and set realistic goals. Your health care provider can help you make an activity plan that is individually designed and works best for you. Exercise regularly as told by your health care provider. This may include: Doing strength training two times a week, such as: Lifting weights. Using resistance bands. Push-ups. Sit-ups. Yoga. Doing a certain intensity of exercise for a given amount of time. Choose from these options: A total of 150 minutes of moderate-intensity exercise every week. A total of 75 minutes of vigorous-intensity exercise every week. A mix of moderate-intensity and  vigorous-intensity exercise every week. Children, pregnant women, people who have not exercised regularly, people who are overweight, and older adults may need to talk with a health care provider about what activities are safe to perform. If you have a medical condition, be sure to talk with your health care provider before you start a new exercise program. What are some exercise ideas? Moderate-intensity exercise ideas include: Walking 1 mile (1.6 km) in about 15 minutes. Biking. Hiking. Golfing. Dancing. Water aerobics. Vigorous-intensity exercise ideas include: Walking 4.5 miles (7.2 km) or more in about 1 hour. Jogging or running 5 miles (8 km) in about 1 hour. Biking 10 miles (16.1 km) or more in about 1 hour. Lap swimming. Roller-skating or in-line skating. Cross-country skiing. Vigorous competitive sports, such as football, basketball, and soccer. Jumping rope. Aerobic dancing. What are some everyday activities that can help me get exercise? Yard work, such as: Child psychotherapist. Raking and bagging leaves. Washing your car. Pushing a stroller. Shoveling snow. Gardening. Washing windows or floors. How can I be more active in my day-to-day activities? Use stairs instead of an elevator. Take a walk during your lunch break. If you drive, park your car farther away from your work or school. If you take public transportation, get off one stop early and walk the rest of the way. Stand up or walk around during all of your indoor phone calls. Get up, stretch, and walk around every 30 minutes throughout the day. Enjoy exercise with a friend. Support to continue exercising will help you keep a regular routine of activity. Where to find more information You can find more information about exercising to stay healthy from: [place]. Department of Health and Human Services: ThisPath.fi Centers for Disease Control and Prevention (  CDC): [employer]Ex[employer].br Summary Exercising regularly is  important. It will improve your overall fitness, flexibility, and endurance. Regular exercise will also improve your overall health. It can help you control your weight, reduce stress, and improve your bone density. Do not exercise so much that you hurt yourself, feel dizzy, or get very short of breath. Before you start a new exercise program, talk with your health care provider. This information is not intended to replace advice given to you by your health care provider. Make sure you discuss any questions you have with your health care provider. Document Revised: 12/29/2020 Document Reviewed: 12/29/2020 Elsevier Patient Education  2024 [employer].

## 2024-06-11 ENCOUNTER — Ambulatory Visit: Payer: Self-pay | Admitting: Nurse Practitioner

## 2024-06-11 LAB — CMP14+EGFR
ALT: 19 IU/L (ref 0–32)
AST: 19 IU/L (ref 0–40)
Albumin: 4.4 g/dL (ref 3.9–4.9)
Alkaline Phosphatase: 47 IU/L (ref 41–116)
BUN/Creatinine Ratio: 13 (ref 9–23)
BUN: 11 mg/dL (ref 6–24)
Bilirubin Total: 0.3 mg/dL (ref 0.0–1.2)
CO2: 19 mmol/L — AB (ref 20–29)
Calcium: 9.3 mg/dL (ref 8.7–10.2)
Chloride: 105 mmol/L (ref 96–106)
Creatinine, Ser: 0.85 mg/dL (ref 0.57–1.00)
Globulin, Total: 2.4 g/dL (ref 1.5–4.5)
Glucose: 78 mg/dL (ref 70–99)
Potassium: 4 mmol/L (ref 3.5–5.2)
Sodium: 140 mmol/L (ref 134–144)
Total Protein: 6.8 g/dL (ref 6.0–8.5)
eGFR: 88 mL/min/1.73 (ref >59)

## 2024-06-11 LAB — CBC WITH DIFFERENTIAL/PLATELET
Basophils Absolute: 0 x10E3/uL (ref 0.0–0.2)
Basos: 1 %
EOS (ABSOLUTE): 0.2 x10E3/uL (ref 0.0–0.4)
Eos: 2 %
Hematocrit: 42.1 % (ref 34.0–46.6)
Hemoglobin: 13.7 g/dL (ref 11.1–15.9)
Immature Grans (Abs): 0 x10E3/uL (ref 0.0–0.1)
Immature Granulocytes: 0 %
Lymphocytes Absolute: 2.5 x10E3/uL (ref 0.7–3.1)
Lymphs: 38 %
MCH: 29.7 pg (ref 26.6–33.0)
MCHC: 32.5 g/dL (ref 31.5–35.7)
MCV: 91 fL (ref 79–97)
Monocytes Absolute: 0.4 x10E3/uL (ref 0.1–0.9)
Monocytes: 7 %
Neutrophils Absolute: 3.4 x10E3/uL (ref 1.4–7.0)
Neutrophils: 51 %
Platelets: 302 x10E3/uL (ref 150–450)
RBC: 4.61 x10E6/uL (ref 3.77–5.28)
RDW: 12.2 % (ref 11.7–15.4)
WBC: 6.5 x10E3/uL (ref 3.4–10.8)

## 2024-06-11 LAB — LIPID PANEL
Cholesterol, Total: 164 mg/dL (ref 100–199)
HDL: 37 mg/dL — AB (ref >39)
LDL CALC COMMENT:: 4.4 ratio (ref 0.0–4.4)
LDL Chol Calc (NIH): 106 mg/dL — AB (ref 0–99)
Triglycerides: 112 mg/dL (ref 0–149)
VLDL Cholesterol Cal: 21 mg/dL (ref 5–40)

## 2024-07-08 ENCOUNTER — Other Ambulatory Visit: Payer: Self-pay | Admitting: Nurse Practitioner

## 2024-07-08 MED ORDER — MEDROXYPROGESTERONE ACETATE 150 MG/ML IM SUSP: 150.0000 mg | Freq: Once | INTRAMUSCULAR | 3 refills | Status: AC

## 2024-07-08 NOTE — Progress Notes (Signed)
 Spotting on current birth contrl. Patient is mentally handicap and need sto not have spotting or monthly cycle. Will try deprprovera vaccinea dn see how she does.  Meds ordered this encounter  Medications   medroxyPROGESTERone  (DEPO-PROVERA ) 150 MG/ML injection    Sig: Inject 1 mL (150 mg total) into the muscle once for 1 dose.    Dispense:  1 mL    Refill:  3    Supervising Provider:   MARYANNE CHEW A [[id number]]   Mary-Margaret Gladis, FNP'

## 2024-07-26 ENCOUNTER — Ambulatory Visit (INDEPENDENT_AMBULATORY_CARE_PROVIDER_SITE_OTHER): Admitting: *Deleted

## 2024-07-26 DIAGNOSIS — Z309 Encounter for contraceptive management, unspecified: Secondary | ICD-10-CM

## 2024-07-26 MED ORDER — MEDROXYPROGESTERONE ACETATE 150 MG/ML IM SUSP
150.0000 mg | INTRAMUSCULAR | Status: AC
  Administered 2024-07-26 – 2024-10-13 (×2): 150 mg via INTRAMUSCULAR

## 2024-07-26 NOTE — Progress Notes (Signed)
Patient is in office today for a nurse visit for Birth Control Injection. Patient Injection was given in the  Right upper quad. gluteus. Patient tolerated injection well.

## 2024-08-09 ENCOUNTER — Encounter: Payer: Self-pay | Admitting: Podiatry

## 2024-08-09 ENCOUNTER — Ambulatory Visit (INDEPENDENT_AMBULATORY_CARE_PROVIDER_SITE_OTHER): Admitting: Podiatry

## 2024-08-09 DIAGNOSIS — B351 Tinea unguium: Secondary | ICD-10-CM

## 2024-08-09 DIAGNOSIS — M2042 Other hammer toe(s) (acquired), left foot: Secondary | ICD-10-CM

## 2024-08-09 DIAGNOSIS — R61 Generalized hyperhidrosis: Secondary | ICD-10-CM | POA: Diagnosis not present

## 2024-08-09 DIAGNOSIS — M79674 Pain in right toe(s): Secondary | ICD-10-CM

## 2024-08-09 DIAGNOSIS — M2041 Other hammer toe(s) (acquired), right foot: Secondary | ICD-10-CM

## 2024-08-11 NOTE — Progress Notes (Signed)
 Subjective: Chief Complaint  Patient presents with   RFC    Rm12 Routine foot care/ pt voiced no concerns today.     43 year old female presents with her mother for concerns of thick elongated toenails that she is not able to trim her self and she also has calluses that cause discomfort.   She also gets cracks between her toes which cause pain.  She states that her feet do sweat excessively.  She has been using the antiperspirant spray which has been helping. Her mom keeps ointment on the toes which helps.  The toes continue to curl and they rub, causing discomfort at times.   No recent injuries or other concerns.   Objective: AAO x3, NAD DP/PT pulses palpable bilaterally, CRT less than 3 seconds Nails to be hypertrophic, dystrophic yellow discoloration.  No edema, erythema or signs of infection.  Tenderness with nails 1-5 bilaterally. Hyperkeratotic lesions medial hallux but also new lesion present lateral aspect of right third toe without any underlying ulceration, drainage or signs of infection.  Digital contractures present. There is macerated tissue on the fourth interspaces bilaterally superficial area of skin breakdown, but improved.  There is no drainage or pus or any open lesions. There is no area pinpoint tenderness.  Minimal callus formation on the lateral 3rd toe right foot without any ulceration or signs of infection.   Digital contractures present.  No pain with calf compression, swelling, warmth, erythema  Assessment: Symptomatic onychomycosis, digital contractures   Plan: Symptomatic onychosis -Sharply debrided nails x 10 without any complications or bleeding.  Continue topical medication   Hyperhidrosis/Hammertoes -Continue antiperspirant spray. Dry well between toes. Continue to change shoes/socks regularly. Monitor the area of skin breakdown between the toes for any signs of infection -Continue offloading to the toes. There was minimal hyperkeratotic tissue on  the right 3rd toe which was debrided without any complications or bleeding.

## 2024-09-30 ENCOUNTER — Ambulatory Visit (INDEPENDENT_AMBULATORY_CARE_PROVIDER_SITE_OTHER): Admitting: Nurse Practitioner

## 2024-09-30 VITALS — BP 117/73 | HR 70 | Temp 97.8°F | Ht 63.0 in | Wt 238.0 lb

## 2024-09-30 DIAGNOSIS — K209 Esophagitis, unspecified without bleeding: Secondary | ICD-10-CM | POA: Diagnosis not present

## 2024-09-30 DIAGNOSIS — R1031 Right lower quadrant pain: Secondary | ICD-10-CM | POA: Diagnosis not present

## 2024-09-30 NOTE — Progress Notes (Signed)
 "  Subjective:    Patient ID: Ashlee Hoffman, female    DOB: 1981/04/13, 44 y.o.   MRN: [id number]   Chief Complaint: Groin Pain (Right side/)   Groin Pain Pertinent negatives include no abdominal pain, headaches or rash.   Patient in with 2 complaints: - chest pain- had this years ago. Has had 1 episode recently. Patient not able to say how long it  lasted but went away on its own. Says was a sharp pain. Not sure if was related to eating. - groin pain. Right side. Intermittent.  Only happens when sh eis lifting her leg up. Will resolve on it's own. 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  Eyes:  Negative for pain.  Respiratory:  Negative for shortness of breath.   Cardiovascular:  Negative for chest pain, palpitations and leg swelling.  Gastrointestinal:  Negative for abdominal pain.  Endocrine: Negative for polydipsia.  Skin:  Negative for rash.  Neurological:  Negative for dizziness, weakness and headaches.  Hematological:  Does not bruise/bleed easily.  All other systems reviewed and are negative.      Objective:   Physical Exam Vitals and nursing note reviewed.  Constitutional:      General: She is not in acute distress.    Appearance: Normal appearance. She is well-developed.  Neck:     Vascular: No carotid bruit or JVD.  Cardiovascular:     Rate and Rhythm: Normal rate and regular rhythm.     Heart sounds: Normal heart sounds.  Pulmonary:     Effort: Pulmonary effort is normal. No respiratory distress.     Breath sounds: Normal breath sounds. No wheezing or rales.  Chest:     Chest wall: No tenderness.  Abdominal:     General: Bowel sounds are normal. There is no distension or abdominal bruit.     Palpations: Abdomen is soft. There is no hepatomegaly, splenomegaly, mass or  pulsatile mass.     Tenderness: There is no abdominal tenderness.  Musculoskeletal:        General: Normal range of motion.     Cervical back: Normal range of motion and neck supple.  Lymphadenopathy:     Cervical: No cervical adenopathy.  Skin:    General: Skin is warm and dry.  Neurological:     Mental Status: She is alert and oriented to person, place, and time.     Deep Tendon Reflexes: Reflexes are normal and symmetric.  Psychiatric:        Behavior: Behavior normal.        Thought Content: Thought content normal.        Judgment: Judgment normal.    BP 117/73   Pulse 70   Temp 97.8 F (36.6 C) (Temporal)   Ht 5' 3 (1.6 m)   Wt 238 lb (108 kg)   SpO2 99%   BMI 42.16 kg/m         Assessment & Plan:   Dore Oquin in today with chief complaint of Groin Pain (Right side/)   1. Esophagitis (Primary) Avoid spicy and fatty foods Report any new issues  2. Right inguinal pain Muscle issue Moist heat Rest Reposition when occurs    The above assessment and management plan was discussed with the patient. The patient verbalized understanding of and has  agreed to the management plan. Patient is aware to call the clinic if symptoms persist or worsen. Patient is aware when to return to the clinic for a follow-up visit. Patient educated on when it is appropriate to go to the emergency department.   Mary-Margaret Gladis, FNP   "

## 2024-09-30 NOTE — Patient Instructions (Signed)
 Irritation and Swelling of the Esophagus (Esophagitis): What to Know  Esophagitis is irritation and swelling of the esophagus. The esophagus is the part of your body that moves food from your mouth to your stomach.  Esophagitis can cause soreness or pain in your esophagus. It can make it hard or painful to swallow. What are the causes? Most causes of esophagitis aren't serious. Common causes include: Gastroesophageal reflux disease (GERD). This is when stomach contents move back up into your esophagus. An allergic reaction. This may be caused by food allergies. An injury to your esophagus. This may happen if: You swallow large pills. You swallow certain medicines. You swallow harmful chemicals. These include cleaning products. You throw up a lot. Drinking a lot of alcohol or smoking. An infection. This may happen if your body has a weak defense system (immune system). Certain treatments for cancer. Certain diseases. These include: Sarcoidosis. Crohn's disease. Scleroderma. What are the signs or symptoms? Trouble swallowing. Pain when you swallow. This may include when you swallow acidic liquids, such as orange juice. You may also have pain when you burp. Chest pain or trouble breathing. Throwing up or feeling like you may throw up. Pain in your belly. Weight loss. Throwing up blood. Poop that is black, tarry, or bright red. How is this diagnosed? Esophagitis may be diagnosed based on your medical history and an exam. You may also have other tests. These may include: An endoscopy. This test looks at your stomach and esophagus with a small camera. Tests of your esophagus to check for: Acid levels. Pressure. A barium swallow test or modified barium swallow test. This can show: The shape and size of your esophagus. How well your esophagus is working. Allergy tests. How is this treated? Treatment depends on what is causing the esophagitis. You may be given medicines to help with  symptoms. You may also need to: Avoid alcohol. Quit smoking. Change your diet. Exercise. Stay at a healthy weight. Change your sleep habits and how you sleep. Follow these instructions at home: Medicines Take your medicines only as told. Do not take aspirin  or ibuprofen  unless you're told to. If you have trouble taking pills: Use a pill splitter. This can lower the chance of the pill getting stuck or hurting your esophagus. Drink water after you take a pill. Eating and drinking Stay away from foods and drinks that seem to make your symptoms worse. Eat and drink as told by your health care provider. You may need to avoid certain foods and drinks. These may include: Coffee and tea, with or without caffeine. Energy drinks and sports drinks. Fizzy drinks or sodas. Chocolate and cocoa. Peppermint and mint flavorings. Garlic and onions. Spicy and acidic foods. These include: Peppers. Horseradish. Chili powder and curry powder. Vinegar. Hot sauces and BBQ sauce. Citrus fruits and juices. These include: Oranges. Lemons. Limes. Tomato-based foods. These include: Red sauce and pizza with red sauce. Chili. Salsa. Fried and fatty foods. These include: Donuts. [place] fries. Potato chips. High-fat dressings. High-fat meats. These include: Hot dogs and sausages. Ribeye steak. Ham and bacon. High-fat dairy items. These include: Whole milk. Butter. Cream cheese. Lifestyle Eat small meals often. Avoid eating big meals. Avoid drinking lots of liquid with your meals. Try not to eat meals during the 2-3 hours before bedtime. Try not to lie down right after you eat. Do not exercise right after you eat. Do not smoke, vape, or use nicotine or tobacco. General instructions  Watch for any changes in your  symptoms. Wear loose clothes. Do not wear things that are tight around your waist. Raise the head of your bed about 6 inches (15 cm). You can use a wedge to do this. Try yoga,  deep breathing, or meditation to help manage stress. If you're overweight, lose an amount of weight that's healthy for you. Ask your provider about a safe weight loss goal. Contact a health care provider if: Your symptoms don't get better with treatment. You have new symptoms. You lose weight and don't know why. You have trouble swallowing, or it hurts to swallow. You have a cough, or you start wheezing. This is when you make high-pitched whistling sounds when you breathe, most often when you breathe out. You have heartburn for more than 2 weeks. You have a fever. Get help right away if: You have very bad pain in your arms, neck, jaw, teeth, or back. You feel sweaty, dizzy, or light-headed all of a sudden. You have chest pain or feel short of breath. You throw up and: It's green, yellow, or black. It looks like blood or coffee grounds. Your poop is red, bloody, or black. You can't swallow, drink, or eat. These symptoms may be an emergency. Call 911 right away. Do not wait to see if the symptoms will go away. Do not drive yourself to the hospital. This information is not intended to replace advice given to you by your health care provider. Make sure you discuss any questions you have with your health care provider. Document Revised: 03/26/2023 Document Reviewed: 03/26/2023 Elsevier Patient Education  2024 [employer].

## 2024-10-11 ENCOUNTER — Ambulatory Visit: Admitting: Podiatry

## 2024-10-11 ENCOUNTER — Ambulatory Visit

## 2024-10-13 ENCOUNTER — Ambulatory Visit: Payer: 59

## 2024-10-13 ENCOUNTER — Ambulatory Visit

## 2024-10-13 VITALS — BP 117/73 | HR 70 | Ht 63.0 in | Wt 238.0 lb

## 2024-10-13 DIAGNOSIS — Z0000 Encounter for general adult medical examination without abnormal findings: Secondary | ICD-10-CM

## 2024-10-13 DIAGNOSIS — Z309 Encounter for contraceptive management, unspecified: Secondary | ICD-10-CM

## 2024-10-13 NOTE — Progress Notes (Signed)
 Patient provided Depo injection today. She is in the appropriate time frame to give injection without a urine pregnancy. Injection given in left upper outer quadrant without difficulty. Next appt scheduled for 4/16.

## 2024-10-13 NOTE — Patient Instructions (Signed)
 Ashlee Hoffman,  Thank you for taking the time for your Medicare Wellness Visit. I appreciate your continued commitment to your health goals. Please review the care plan we discussed, and feel free to reach out if I can assist you further.  Please note that Annual Wellness Visits do not include a physical exam. Some assessments may be limited, especially if the visit was conducted virtually. If needed, we may recommend an in-person follow-up with your provider.  Ongoing Care Seeing your primary care provider every 3 to 6 months helps us  monitor your health and provide consistent, personalized care.   Referrals If a referral was made during today's visit and you haven't received any updates within two weeks, please contact the referred provider directly to check on the status.  Recommended Screenings:  Health Maintenance  Topic Date Due   Hepatitis C Screening  Never done   Hepatitis B Vaccine (1 of 3 - 19+ 3-dose series) Never done   Medicare Annual Wellness Visit  10/12/2024   Flu Shot  12/14/2024*   Pap with HPV screening  02/11/2025*   HIV Screening  06/17/2025*   COVID-19 Vaccine (1 - 2025-26 season) 07/03/2025*   DTaP/Tdap/Td vaccine (2 - Td or Tdap) 12/19/2024   Breast Cancer Screening  10/26/2025   HPV Vaccine (No Doses Required) Completed   Pneumococcal Vaccine  Aged Out   Meningitis B Vaccine  Aged Out  *Topic was postponed. The date shown is not the original due date.       10/13/2024   10:31 AM  Advanced Directives  Does Patient Have a Medical Advance Directive? No  Would patient like information on creating a medical advance directive? No - Patient declined    Vision: Annual vision screenings are recommended for early detection of glaucoma, cataracts, and diabetic retinopathy. These exams can also reveal signs of chronic conditions such as diabetes and high blood pressure.  Dental: Annual dental screenings help detect early signs of oral cancer, gum disease, and other  conditions linked to overall health, including heart disease and diabetes.  Please see the attached documents for additional preventive care recommendations.

## 2024-10-13 NOTE — Progress Notes (Signed)
 "  Chief Complaint  Patient presents with   Medicare Wellness     Subjective:   Ashlee Hoffman is a 44 y.o. female who presents for a Medicare Annual Wellness Visit.  Visit info / Clinical Intake: Medicare Wellness Visit Type:: Subsequent Annual Wellness Visit (pt's mom) Persons participating in visit and providing information:: patient & caregiver Medicare Wellness Visit Mode:: Telephone If telephone:: video declined Since this visit was completed virtually, some vitals may be partially provided or unavailable. Missing vitals are due to the limitations of the virtual format.: Unable to obtain vitals - no equipment If Telephone or Video please confirm:: I connected with patient using audio/video enable telemedicine. I verified patient identity with two identifiers, discussed telehealth limitations, and patient agreed to proceed. Patient Location:: home Provider Location:: office Interpreter Needed?: No Pre-visit prep was completed: yes AWV questionnaire completed by patient prior to visit?: no Living arrangements:: (!) lives alone Patient's Overall Health Status Rating: good Typical amount of pain: some (ankle/back pain taking tylenol  see pcp per pt's mom) Does pain affect daily life?: no Are you currently prescribed opioids?: no  Dietary Habits and Nutritional Risks How many meals a day?: 3 Eats fruit and vegetables daily?: yes Most meals are obtained by: preparing own meals In the last 2 weeks, have you had any of the following?: none Diabetic:: no  Functional Status Activities of Daily Living (to include ambulation/medication): Independent Ambulation: Independent Medication Administration: Needs assistance (comment) (pt's mom) Home Management (perform basic housework or laundry): Needs assistance (comment) (pt's mom) Manage your own finances?: (!) no (pt's mom) Primary transportation is: family / friends (pt's mom) Concerns about hearing?: no  Fall Screening Falls in the  past year?: 0 Number of falls in past year: 0 Was there an injury with Fall?: 0 Fall Risk Category Calculator: 0 Patient Fall Risk Level: Low Fall Risk  Fall Risk Patient at Risk for Falls Due to: No Fall Risks Fall risk Follow up: Falls evaluation completed; Education provided  Home and Transportation Safety: All rugs have non-skid backing?: yes All stairs or steps have railings?: yes Grab bars in the bathtub or shower?: yes Have non-skid surface in bathtub or shower?: yes Good home lighting?: yes Regular seat belt use?: yes Hospital stays in the last year:: no  Cognitive Assessment Difficulty concentrating, remembering, or making decisions? : yes Will 6CIT or Mini Cog be Completed: no 6CIT or Mini Cog Declined: patient has a diagnosis of dementia or cognitive impairment  Advance Directives (For Healthcare) Does Patient Have a Medical Advance Directive?: No Would patient like information on creating a medical advance directive?: No - Patient declined  Reviewed/Updated  Reviewed/Updated: Reviewed All (Medical, Surgical, Family, Medications, Allergies, Care Teams, Patient Goals)    Allergies (verified) Septra [sulfamethoxazole-trimethoprim], Sulfamethoxazole, and Trimethoprim   Current Medications (verified) Outpatient Encounter Medications as of 10/13/2024  Medication Sig   Acetaminophen  (TYLENOL  ARTHRITIS EXT RELIEF PO) Take 1-2 tablets by mouth as needed. Reported on 01/12/2016   aluminum chloride (DRYSOL) 20 % external solution Apply topically at bedtime.   cetirizine  (ZYRTEC ) 10 MG tablet Take 1 tablet (10 mg total) by mouth daily.   diclofenac  Sodium (VOLTAREN ) 1 % GEL Apply 2 g topically 4 (four) times daily. Rub into affected area of foot 2 to 4 times daily   medroxyPROGESTERone  (DEPO-PROVERA ) 150 MG/ML injection Inject 1 mL (150 mg total) into the muscle once for 1 dose.   meloxicam  (MOBIC ) 15 MG tablet TAKE 1 TABLET (15 MG TOTAL) BY MOUTH DAILY.  Facility-Administered Encounter Medications as of 10/13/2024  Medication   medroxyPROGESTERone  (DEPO-PROVERA ) injection 150 mg    History: Past Medical History:  Diagnosis Date   Allergy 1983   Arthritis 2021   Bulging lumbar disc 05/2013   GERD (gastroesophageal reflux disease) ?   Mental disability    Pure hypercholesterolemia 06/19/2021   Shortness of breath    with exertion   Past Surgical History:  Procedure Laterality Date   LUMBAR LAMINECTOMY/DECOMPRESSION MICRODISCECTOMY N/A 05/24/2013   Procedure: LUMBAR LAMINECTOMY/DECOMPRESSION MICRODISCECTOMY 1 LEVEL LEFT LUMBAR FIVE-SACARAL-ONE;  Surgeon: Victory DELENA Gunnels, MD;  Location: [facility];  Service: Neurosurgery;  Laterality: N/A;  left   SPINE SURGERY  2014   Family History  Problem Relation Age of Onset   Hypertension Mother    Arthritis Mother    Diabetes Maternal Grandfather    Hypertension Maternal Grandfather    Heart disease Maternal Grandfather    Breast cancer Neg Hx    Social History   Occupational History   Not on file  Tobacco Use   Smoking status: Never   Smokeless tobacco: Never  Substance and Sexual Activity   Alcohol use: No   Drug use: No   Sexual activity: Never   Tobacco Counseling Counseling given: Yes  SDOH Screenings   Food Insecurity: No Food Insecurity (10/13/2024)  Housing: Low Risk (10/13/2024)  Transportation Needs: No Transportation Needs (10/13/2024)  Utilities: Not At Risk (10/13/2024)  Alcohol Screen: Low Risk (10/13/2023)  Depression (PHQ2-9): Low Risk (10/13/2024)  Financial Resource Strain: Low Risk (09/30/2024)  Physical Activity: Insufficiently Active (10/13/2024)  Social Connections: Socially Isolated (10/13/2024)  Stress: No Stress Concern Present (10/13/2024)  Tobacco Use: Low Risk (10/13/2024)  Health Literacy: Inadequate Health Literacy (10/13/2024)   See flowsheets for full screening details  Depression Screen PHQ 2 & 9 Depression Scale- Over the past 2 weeks, how  often have you been bothered by any of the following problems? Little interest or pleasure in doing things: 0 Feeling down, depressed, or hopeless (PHQ Adolescent also includes...irritable): 0 PHQ-2 Total Score: 0     Goals Addressed             This Visit's Progress    Exercise 3x per week (30 min per time)   On track            Objective:    Today's Vitals   10/13/24 1215  BP: 117/73  Pulse: 70  Weight: 238 lb (108 kg)  Height: 5' 3 (1.6 m)   Body mass index is 42.16 kg/m.  Hearing/Vision screen No results found. Immunizations and Health Maintenance Health Maintenance  Topic Date Due   Hepatitis C Screening  Never done   Hepatitis B Vaccines 19-59 Average Risk (1 of 3 - 19+ 3-dose series) Never done   Influenza Vaccine  12/14/2024 (Originally 04/16/2024)   Cervical Cancer Screening (HPV/Pap Cotest)  02/11/2025 (Originally 02/22/2012)   HIV Screening  06/17/2025 (Originally 02/21/1997)   COVID-19 Vaccine (1 - 2025-26 season) 07/03/2025 (Originally 05/17/2024)   DTaP/Tdap/Td (2 - Td or Tdap) 12/19/2024   Medicare Annual Wellness (AWV)  10/13/2025   Mammogram  10/26/2025   HPV VACCINES (No Doses Required) Completed   Pneumococcal Vaccine  Aged Out   Meningococcal B Vaccine  Aged Out        Assessment/Plan:  This is a routine wellness examination for Jadine.  Patient Care Team: Gladis Mustard, FNP as PCP - General (Nurse Practitioner) Gunnels Victory, MD as Consulting Physician (Neurosurgery) Gershon Cough  R, DPM as Consulting Physician Personal Assistant)  I have personally reviewed and noted the following in the patients chart:   Medical and social history Use of alcohol, tobacco or illicit drugs  Current medications and supplements including opioid prescriptions. Functional ability and status Nutritional status Physical activity Advanced directives List of other physicians Hospitalizations, surgeries, and ER visits in previous 12 months Vitals Screenings  to include cognitive, depression, and falls Referrals and appointments  No orders of the defined types were placed in this encounter.  In addition, I have reviewed and discussed with patient certain preventive protocols, quality metrics, and best practice recommendations. A written personalized care plan for preventive services as well as general preventive health recommendations were provided to patient.   Ozie Ned, CMA   10/13/2024   Return in 1 year (on 10/13/2025).  After Visit Summary: (MyChart) Due to this being a telephonic visit, the after visit summary with patients personalized plan was offered to patient via MyChart   Nurse Notes: Pt's mom is aware and understand pt is due the following: Hep C screening/Hep vaccine will get at next OV w/pcp "

## 2024-10-15 ENCOUNTER — Ambulatory Visit: Admitting: Podiatry

## 2024-10-22 ENCOUNTER — Ambulatory Visit: Admitting: Podiatry

## 2024-10-22 ENCOUNTER — Ambulatory Visit: Payer: Self-pay

## 2024-10-22 NOTE — Telephone Encounter (Signed)
 Appt made

## 2024-10-22 NOTE — Telephone Encounter (Signed)
"  ° °  FYI Only or Action Required?: FYI only for provider: appointment scheduled on 02.09.26.  Patient was last seen in primary care on 09/30/2024 by Gladis Mustard, FNP.  Called Nurse Triage reporting Vaginitis.  Symptoms began several weeks ago.  Interventions attempted: Nothing.  Symptoms are: gradually worsening.  Triage Disposition: See PCP When Office is Open (Within 3 Days)  Patient/caregiver understands and will follow disposition?: Yes   Message from Debby BROCKS sent at 10/22/2024  3:24 PM EST  Reason for Triage: Patients Mother Yuvonne Oak called in for the patient stating that the patient might have a yeast infection. She is experiencing discharge and pain when she wipes. She would like to have it examined.  [phone]   Reason for Disposition  Bad smelling vaginal discharge  Answer Assessment - Initial Assessment Questions 1. DISCHARGE: Describe the discharge. (e.g., white, yellow, green, gray, foamy, cottage cheese-like)     Yes discharge  2. ODOR: Is there a bad odor?     Yes, odor  3. ONSET: When did the discharge begin?     X1-2 weeks  4. RASH: Is there a rash in the genital area? If Yes, ask: Describe it. (e.g., redness, blisters, sores, bumps)     Denies  5. ABDOMEN PAIN: Are you having any abdomen pain? If Yes, ask: What does it feel like?  (e.g., crampy, dull, intermittent, constant)      Denies  6. ABDOMEN PAIN SEVERITY: If present, ask: How bad is it? (e.g., Scale 1-10; mild, moderate, or severe)     Denies  7. CAUSE: What do you think is causing the discharge? Have you had the same problem before? What happened then?     Unsure  8. OTHER SYMPTOMS: Pt denies fever, itching, urination pain, vaginal bleeding        Pt's mother reports Yeast infection Pt's mother scheduled for a visit on  02.09.26 for further evaluation. Pt's mother agrees with plan of care, will call back for any worsening symptoms  Protocols used:  Vaginal Discharge-A-AH  "

## 2024-10-25 ENCOUNTER — Ambulatory Visit: Admitting: Nurse Practitioner

## 2024-10-25 ENCOUNTER — Ambulatory Visit: Admitting: Podiatry

## 2024-10-26 ENCOUNTER — Ambulatory Visit: Admitting: Podiatry

## 2024-11-10 ENCOUNTER — Encounter

## 2024-12-07 ENCOUNTER — Ambulatory Visit: Payer: Self-pay | Admitting: Nurse Practitioner

## 2024-12-30 ENCOUNTER — Ambulatory Visit

## 2025-01-12 ENCOUNTER — Ambulatory Visit: Admitting: Physician Assistant

## 2025-10-14 ENCOUNTER — Ambulatory Visit
